# Patient Record
Sex: Female | Born: 1965 | Race: Black or African American | Hispanic: No | Marital: Married | Smoking: Former smoker
Health system: Southern US, Community
[De-identification: ages and names within clinical notes are randomized; demographics above are authoritative.]

## PROBLEM LIST (undated history)

## (undated) DIAGNOSIS — Z801 Family history of malignant neoplasm of trachea, bronchus and lung: Secondary | ICD-10-CM

## (undated) DIAGNOSIS — Z803 Family history of malignant neoplasm of breast: Secondary | ICD-10-CM

## (undated) DIAGNOSIS — I10 Essential (primary) hypertension: Secondary | ICD-10-CM

## (undated) DIAGNOSIS — M1990 Unspecified osteoarthritis, unspecified site: Secondary | ICD-10-CM

## (undated) DIAGNOSIS — Z87442 Personal history of urinary calculi: Secondary | ICD-10-CM

## (undated) DIAGNOSIS — Z923 Personal history of irradiation: Secondary | ICD-10-CM

## (undated) DIAGNOSIS — I639 Cerebral infarction, unspecified: Secondary | ICD-10-CM

## (undated) DIAGNOSIS — E7800 Pure hypercholesterolemia, unspecified: Secondary | ICD-10-CM

## (undated) HISTORY — DX: Family history of malignant neoplasm of trachea, bronchus and lung: Z80.1

## (undated) HISTORY — PX: ABDOMINAL HYSTERECTOMY: SHX81

## (undated) HISTORY — PX: BREAST BIOPSY: SHX20

## (undated) HISTORY — PX: CHOLECYSTECTOMY: SHX55

## (undated) HISTORY — DX: Family history of malignant neoplasm of breast: Z80.3

## (undated) HISTORY — DX: Pure hypercholesterolemia, unspecified: E78.00

---

## 2005-02-23 ENCOUNTER — Ambulatory Visit: Payer: Self-pay | Admitting: Unknown Physician Specialty

## 2005-02-23 IMAGING — US ABDOMEN ULTRASOUND
1 series · 17 of 25 positions shown · non-contrast
Comparison: none

REASON FOR EXAM: abdominal pain, nausea and vomitting
COMMENTS:

[Series 1: abdomen ultrasound · 17 of 55 slices shown]
[im 1/55]
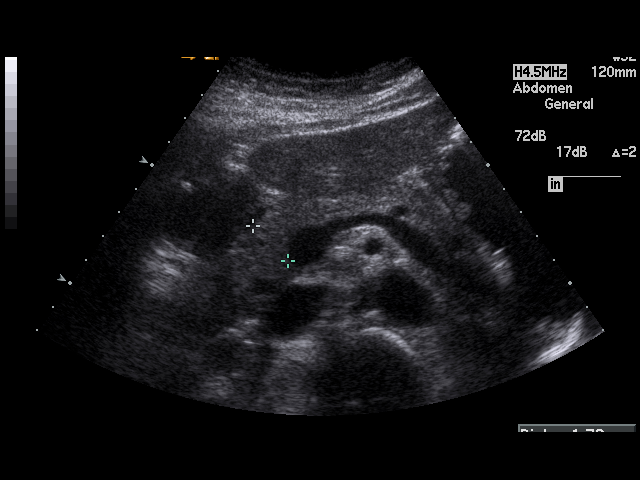
[im 5/55]
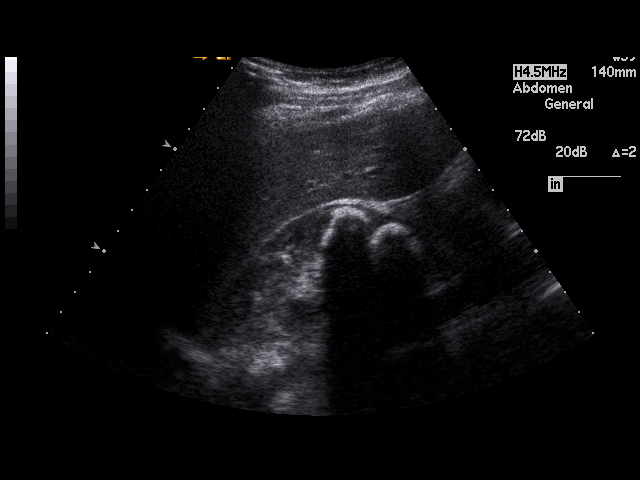
[im 7/55]
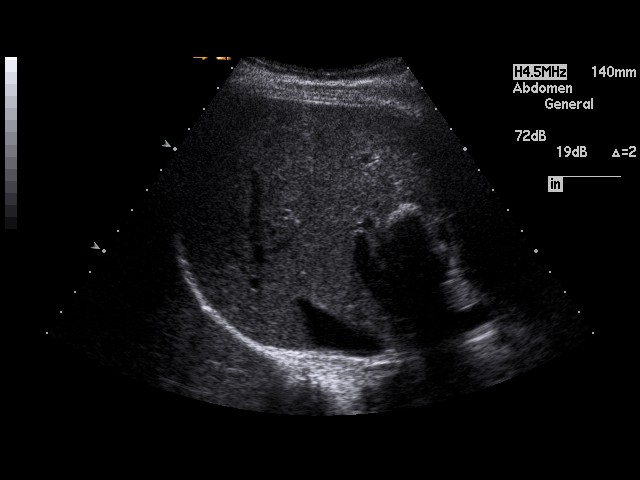
[im 12/55]
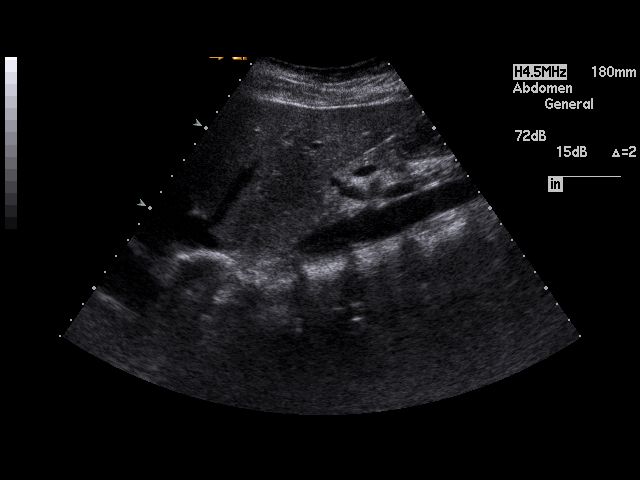
[im 14/55]
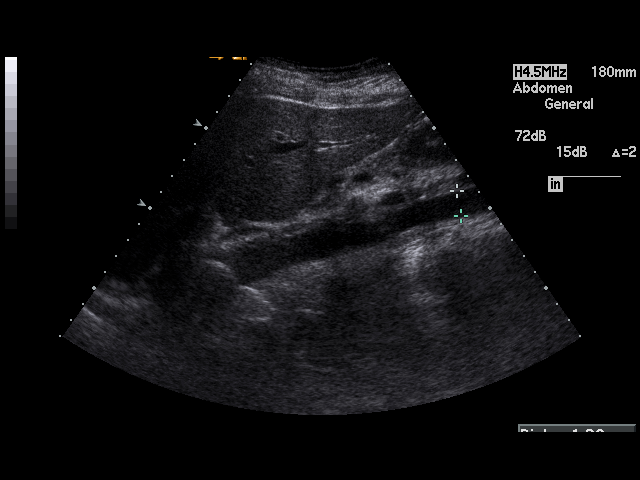
[im 19/55]
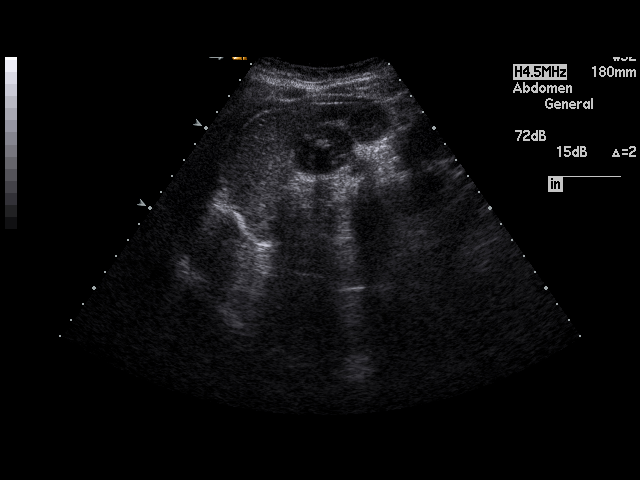
[im 21/55]
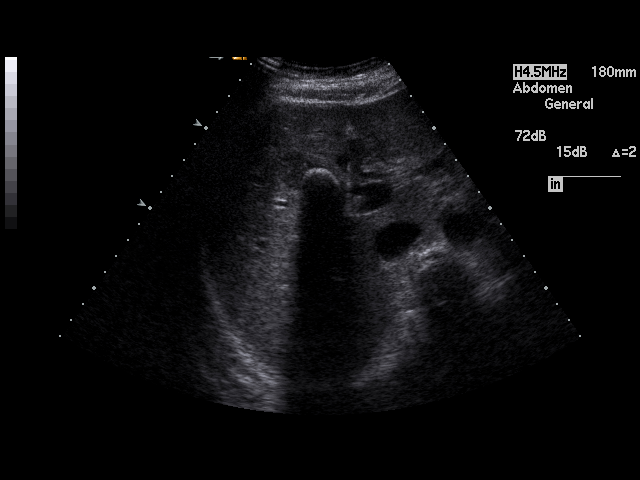
[im 25/55]
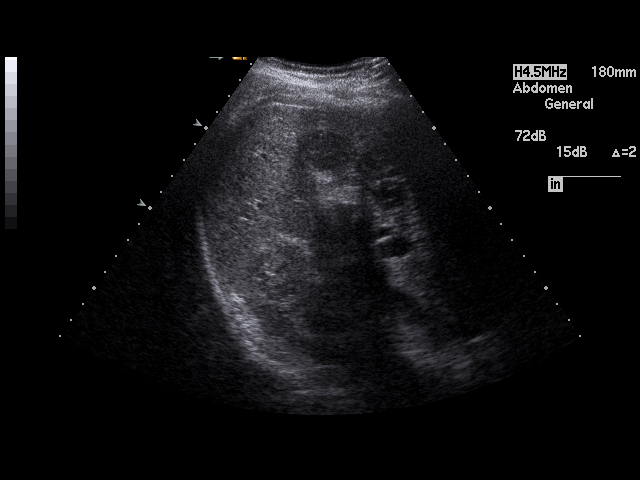
[im 28/55]
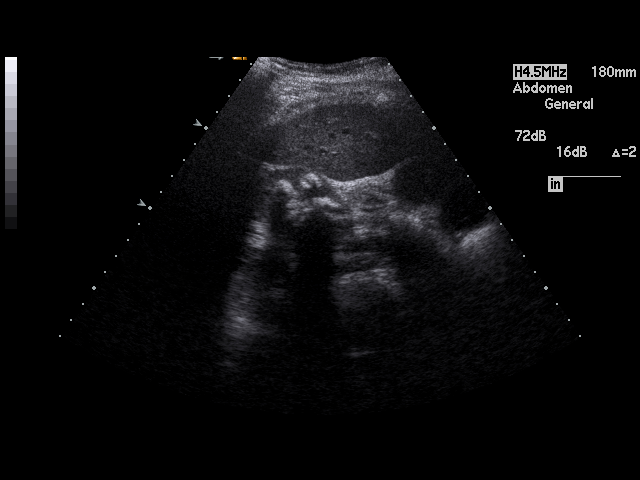
[im 30/55]
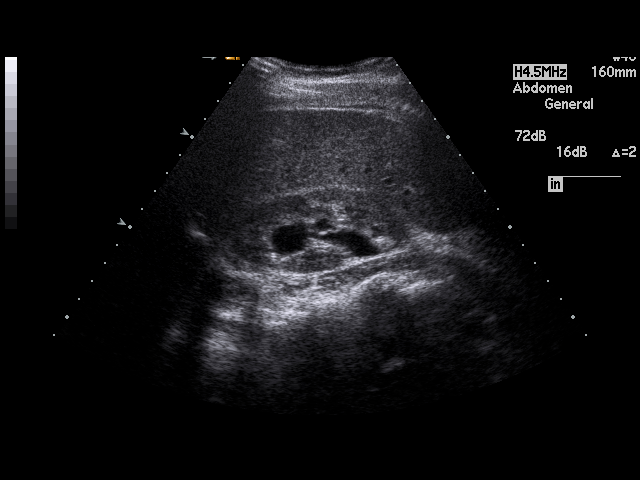
[im 34/55]
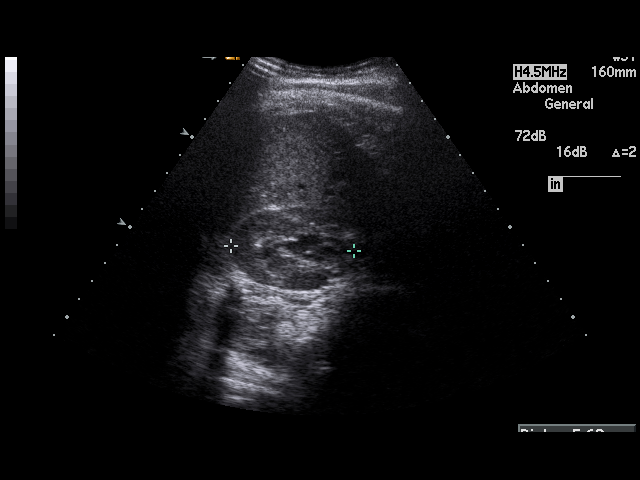
[im 37/55]
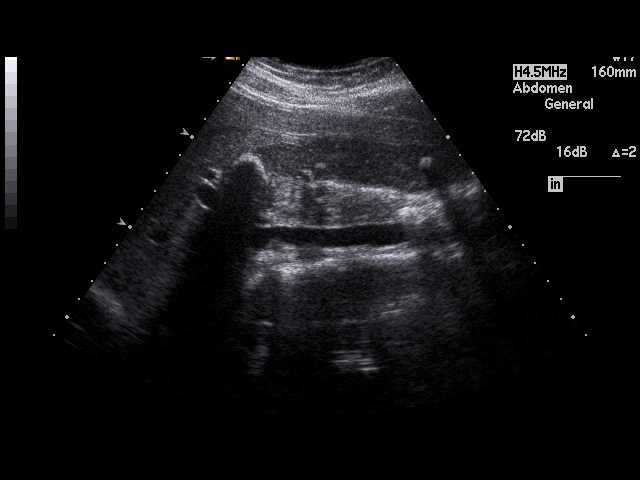
[im 41/55]
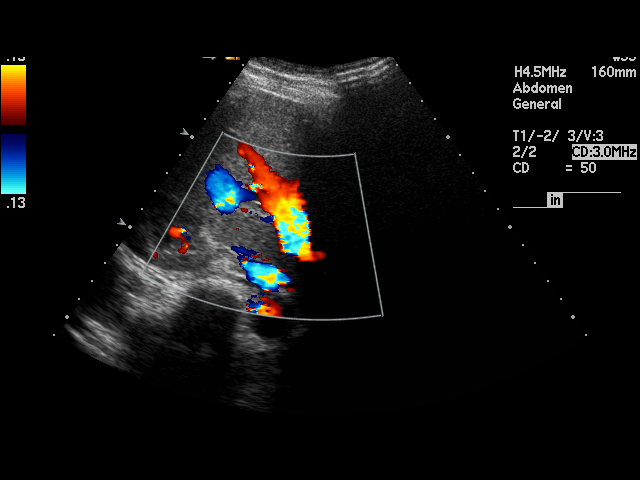
[im 43/55]
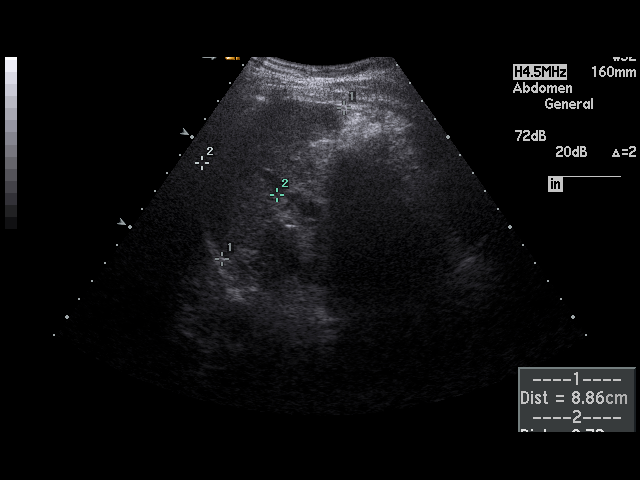
[im 48/55]
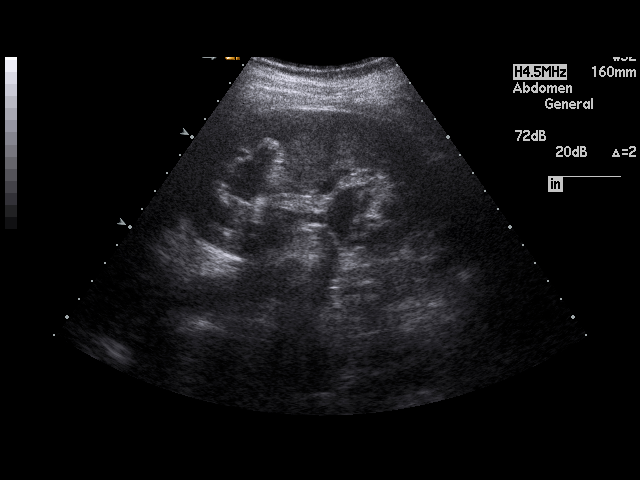
[im 50/55]
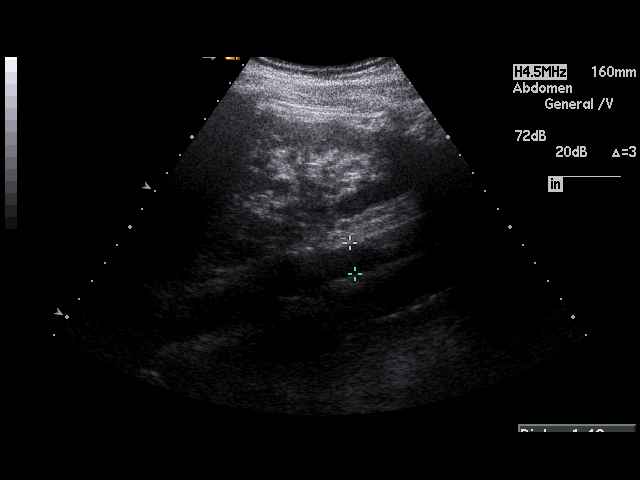
[im 55/55]
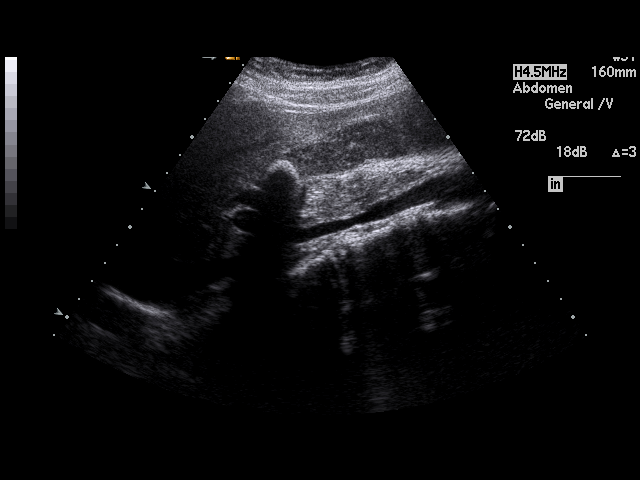

[17 of 25 positions shown; findings below may reference images not displayed]

PROCEDURE:     US  - US ABDOMEN GENERAL SURVEY  - [DATE] [DATE]

RESULT:     The liver, spleen and pancreas show no significant
abnormalities.  There are multiple echodensities in the gallbladder
compatible with gallstones.  Sludge is also present in the gallbladder. No
definite thickening of the gallbladder wall is seen.  There is noted a stone
visualized at the gallbladder neck, which does not move as the patient
changes position and may be lodged or intermittently lodged in the neck.
Note is made that a similar appearance is seen on a prior ultrasound exam of
[DATE].  The common bile duct measures 6.5 mm in diameter which is upper
limits to normal in size.

The kidneys show mild bilateral hydronephrosis, more prominent on the RIGHT.
 Multiple RIGHT renal caliceal stones are noted.  These were also present on
the prior exam of [DATE].  No ascites is seen.
IMPRESSION: 1)Cholelithiasis.

2)The common bile duct is upper limits of normal in size.

3)There is mild bilateral hydronephrosis, more prominent on the RIGHT.

4)Multiple RIGHT renal stones are seen.

## 2005-03-03 ENCOUNTER — Emergency Department: Payer: Self-pay | Admitting: Emergency Medicine

## 2005-05-25 ENCOUNTER — Other Ambulatory Visit: Payer: Self-pay

## 2005-05-29 ENCOUNTER — Ambulatory Visit: Payer: Self-pay | Admitting: General Surgery

## 2006-04-29 ENCOUNTER — Ambulatory Visit: Payer: Self-pay | Admitting: Family Medicine

## 2006-04-29 IMAGING — CR CERVICAL SPINE - COMPLETE 4+ VIEW
1 series · 7 of 7 positions shown · non-contrast
Comparison: none

REASON FOR EXAM: C-SPINE CODE 782.0  DISTURBANCE OF THE SKIN SENSATION
COMMENTS:

[Series 1: view not recorded · 0.17mm/px · 7 of 7 slices shown]
[im 1/7]
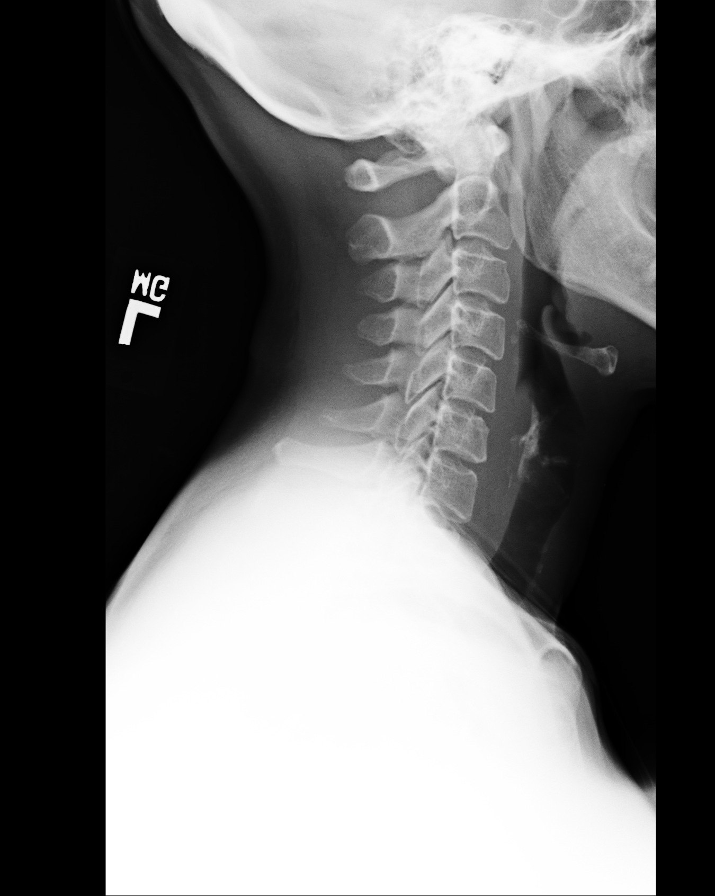
[im 2/7]
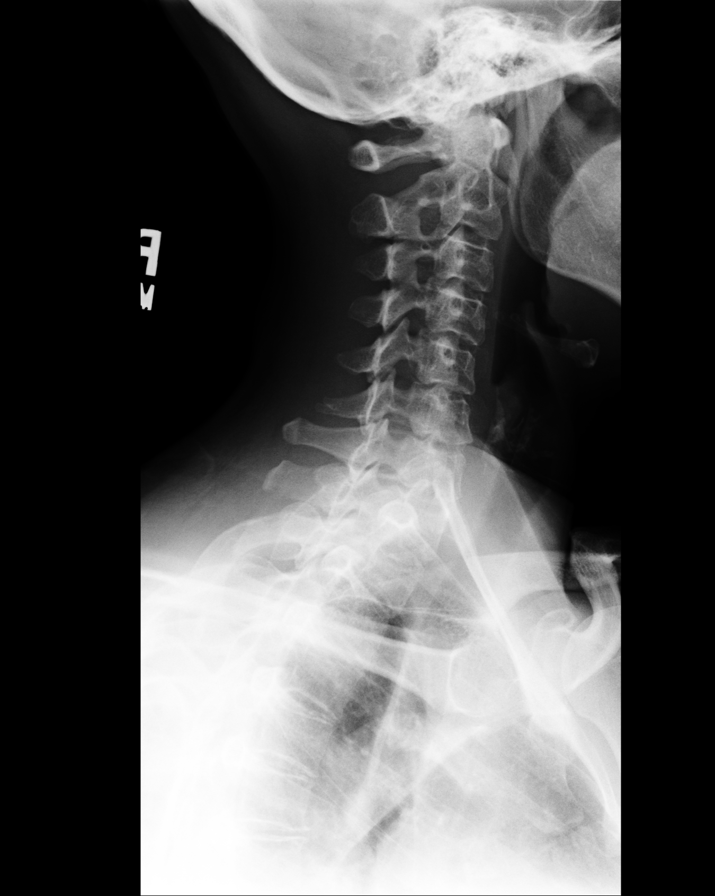
[im 3/7]
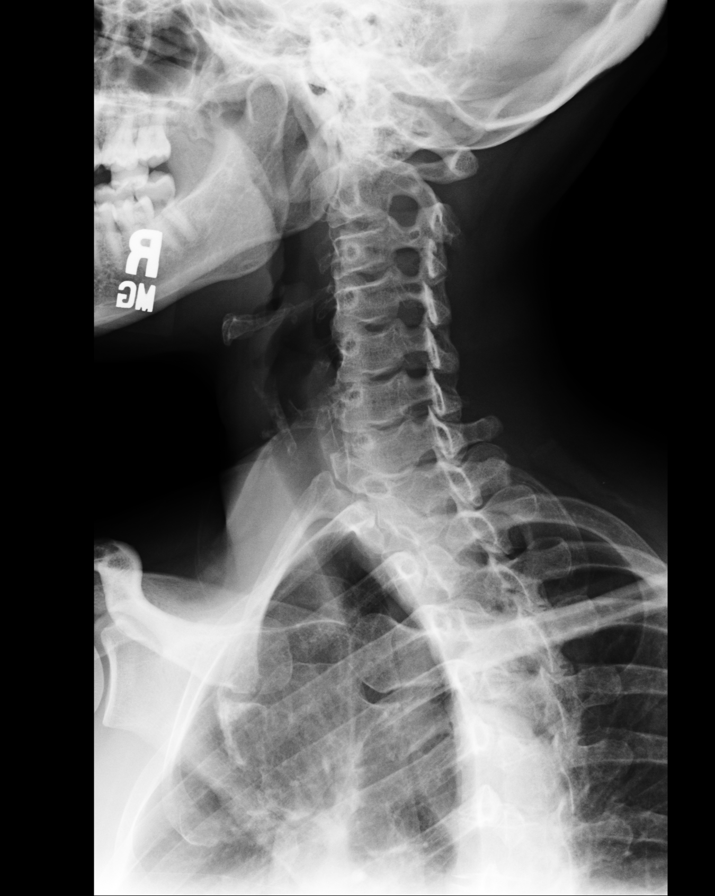
[im 4/7]
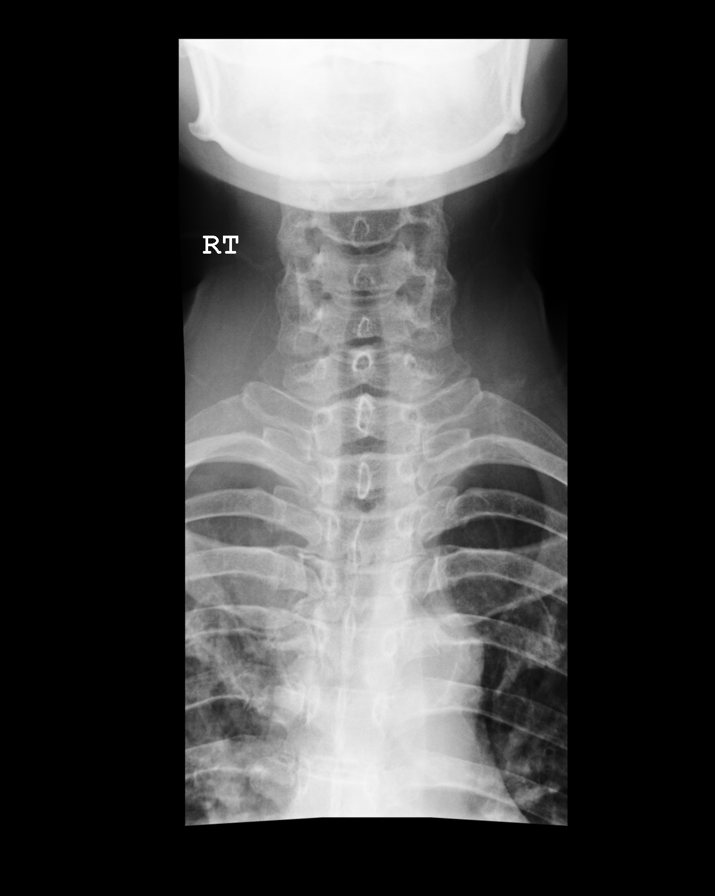
[im 5/7]
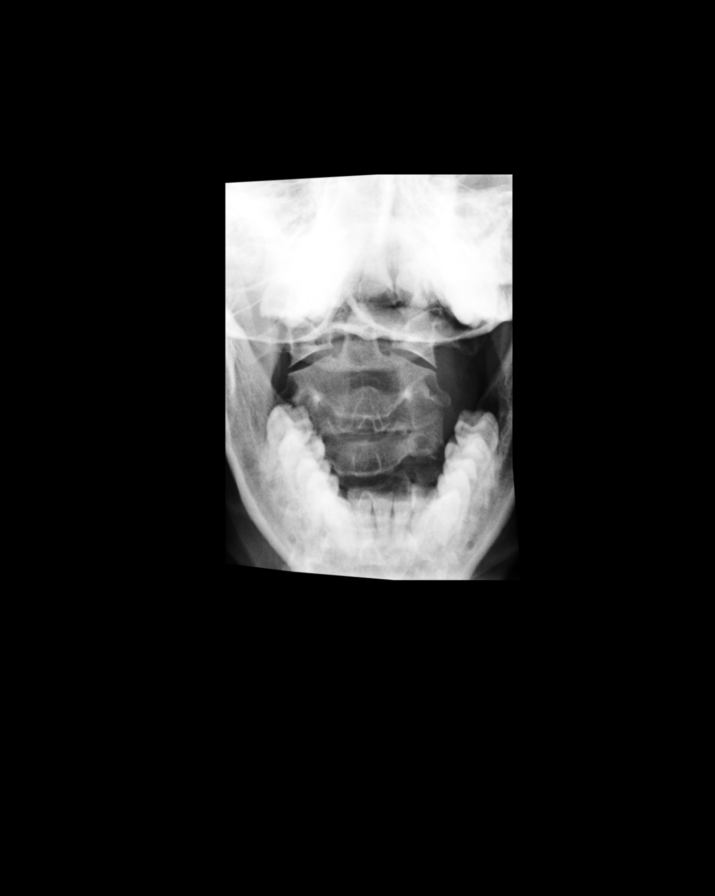
[im 6/7]
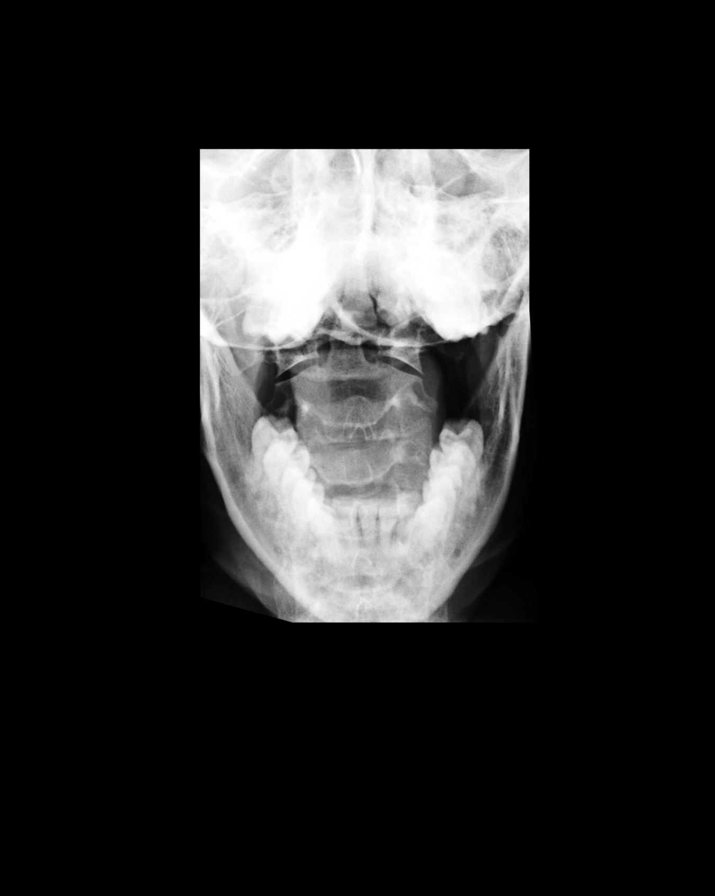
[im 7/7]
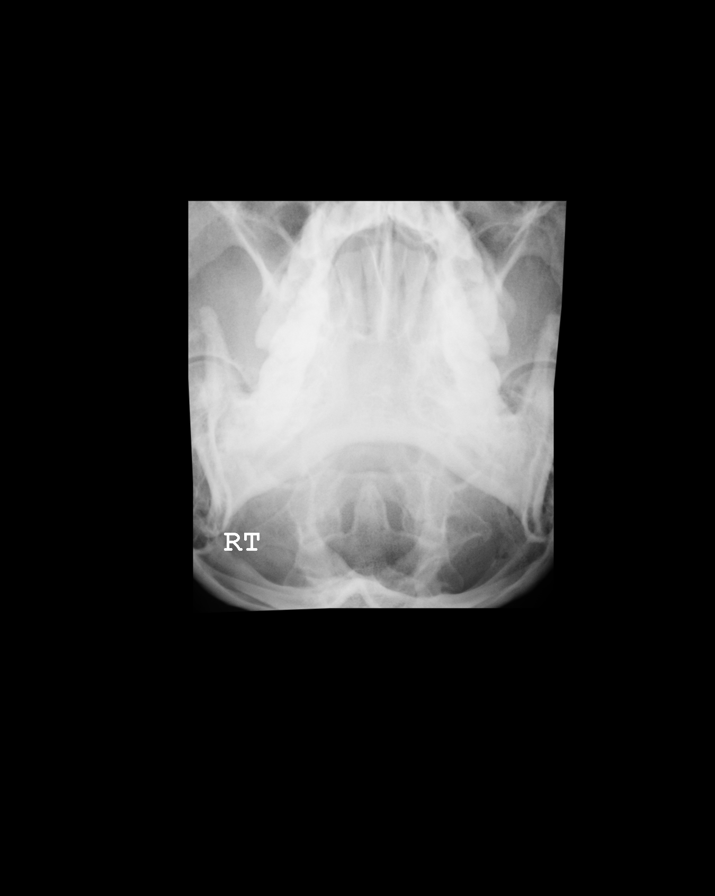

[7 of 7 positions shown; findings below may reference images not displayed]

PROCEDURE:     DXR - DXR CERVICAL SPINE COMPLETE  - [DATE] [DATE]

RESULT:       The patient is complaining of neck discomfort.

The cervical vertebral bodies are preserved in height.  The intervertebral
disc space heights are well maintained.  The oblique views reveal no
high-grade bony encroachment upon the neural foramina.  The  odontoid is
intact.   The lateral masses of C1 align normally with those of C2.
IMPRESSION: I do not see acute cervical spine abnormality.    Further
evaluation with MRI is available if the patient's symptoms persist or are
radicular in nature.

## 2008-02-19 ENCOUNTER — Ambulatory Visit: Payer: Self-pay | Admitting: Family Medicine

## 2010-02-04 ENCOUNTER — Inpatient Hospital Stay: Payer: Self-pay | Admitting: Internal Medicine

## 2010-02-04 IMAGING — CT CT HEAD WITHOUT CONTRAST
2 series · 16 of 30 positions shown, 20 images · non-contrast
Comparison: none

REASON FOR EXAM: HA x 2 wks, at times w/ nausea & photphobia
COMMENTS:   May transport without cardiac monitor

PROCEDURE:     CT  - CT HEAD WITHOUT CONTRAST  - [DATE] [DATE]
RESULT:     Comparison:  None
TECHNIQUE: Multiple axial images from the foramen magnum to the vertex were
obtained without IV contrast.

[Series 2: without · axial · non-contrast · 0.42mm/px · z∈[+360,+490]mm · 13 of 32 slices shown, 17 images]
[im 3/32  brain]
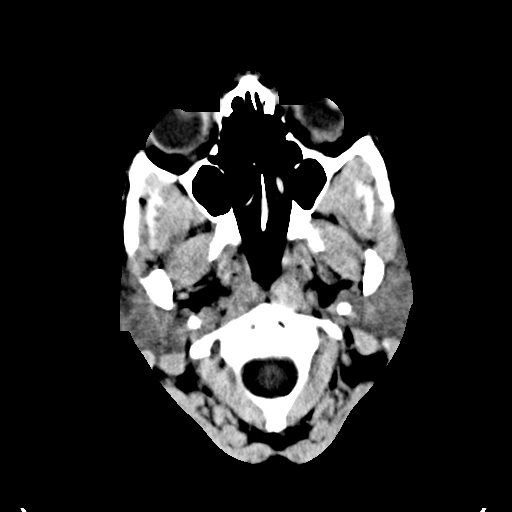
[im 3/32  bone]
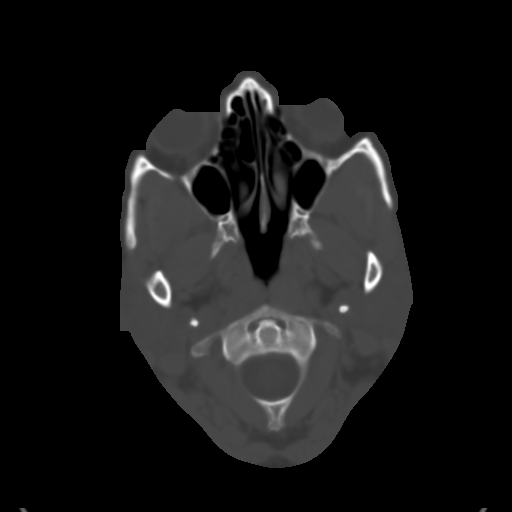
[im 5/32  brain]
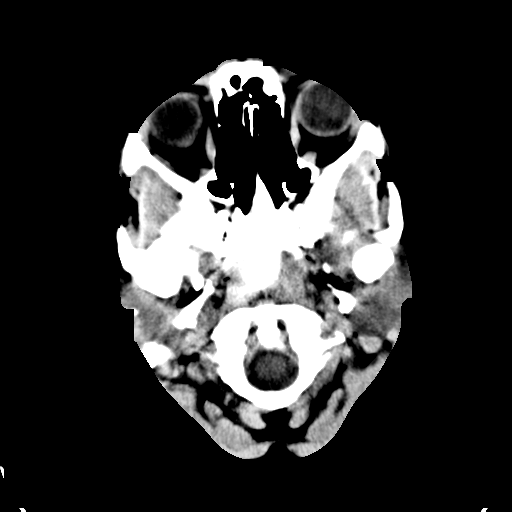
[im 7/32  brain]
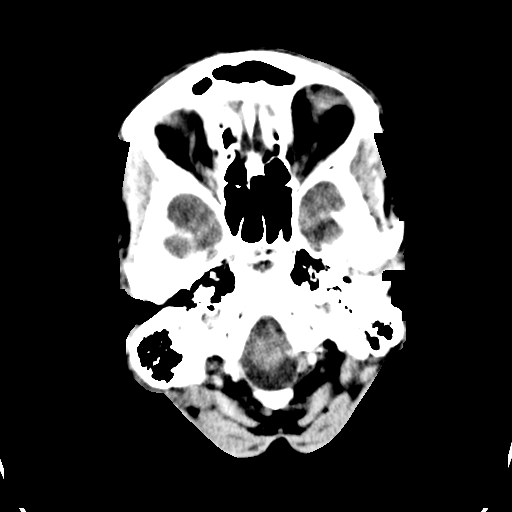
[im 9/32  brain]
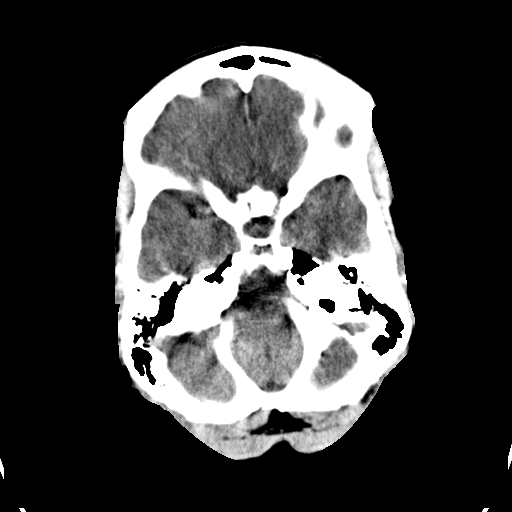
[im 12/32  brain]
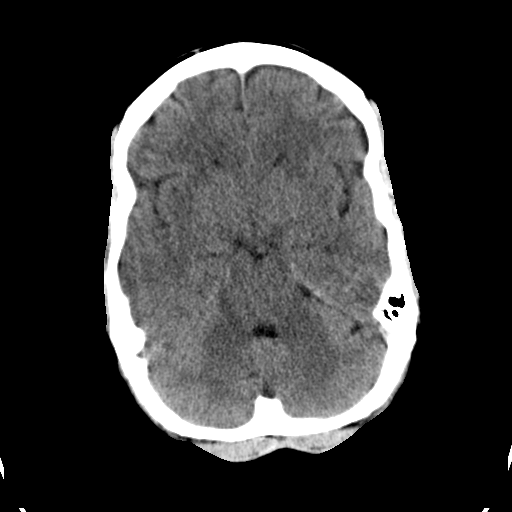
[im 12/32  bone]
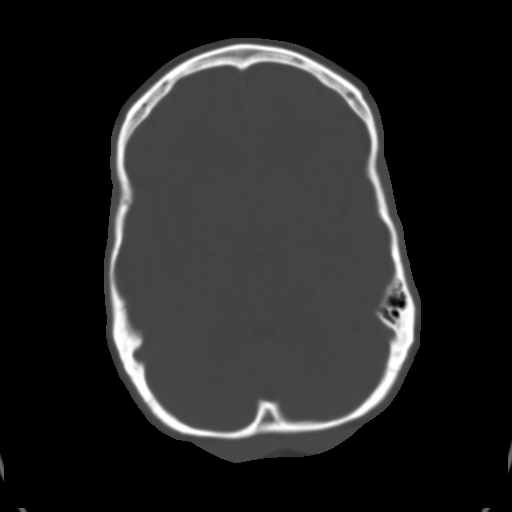
[im 14/32  brain]
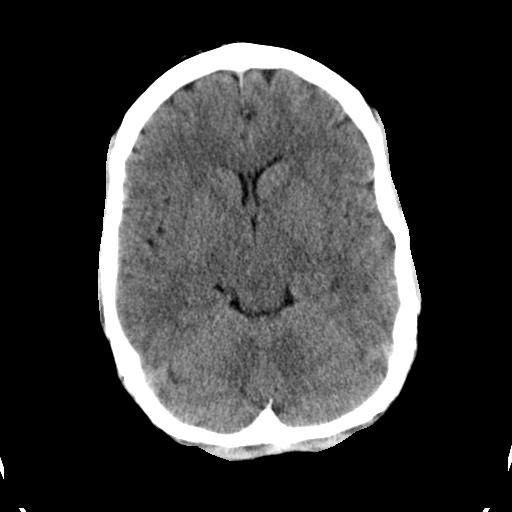
[im 16/32  brain]
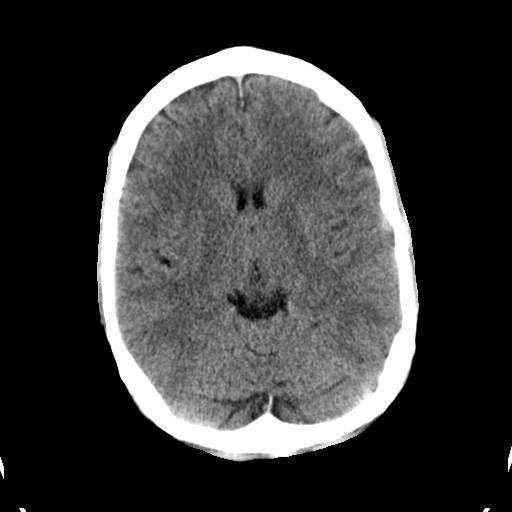
[im 18/32  brain]
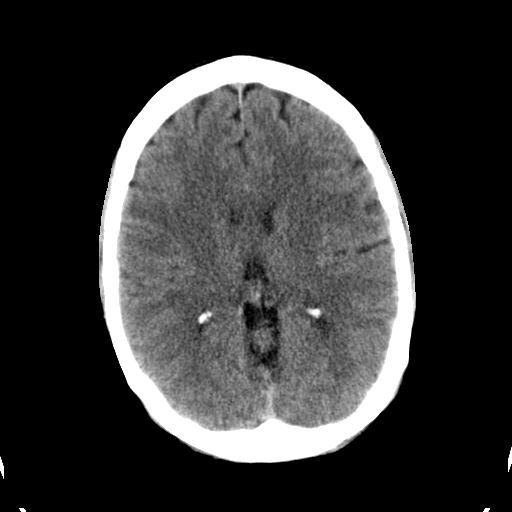
[im 20/32  brain]
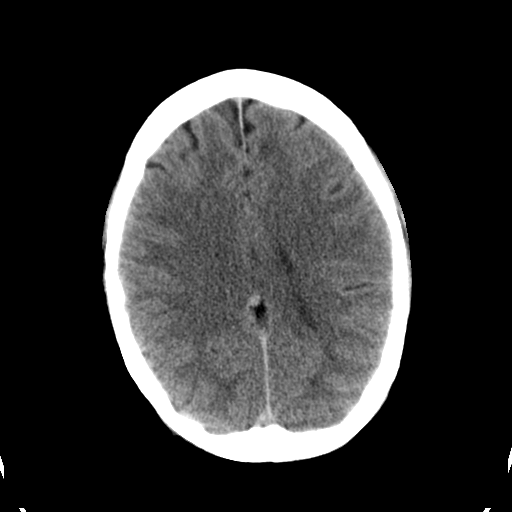
[im 20/32  bone]
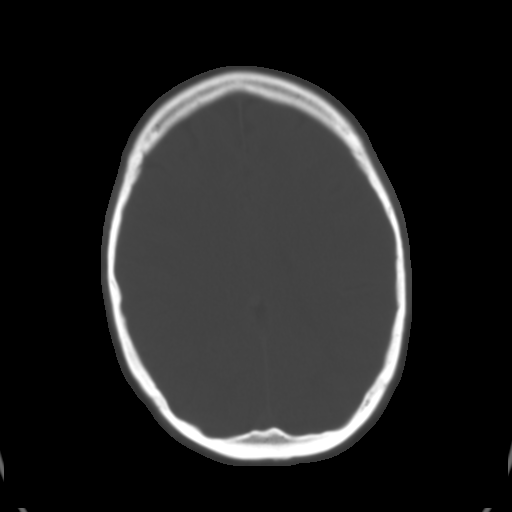
[im 23/32  brain]
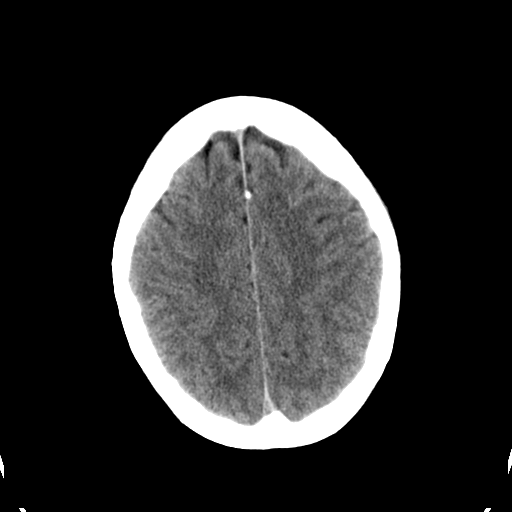
[im 25/32  brain]
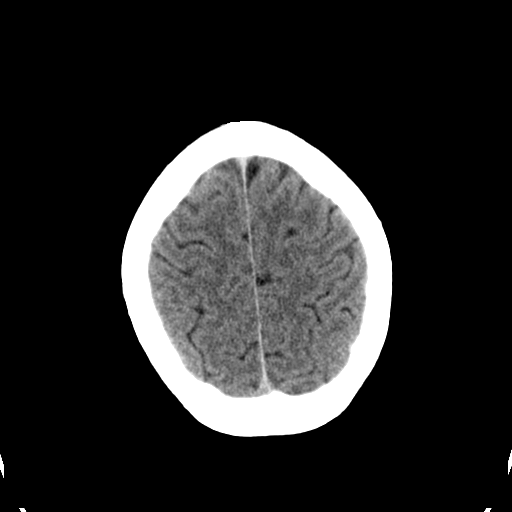
[im 27/32  brain]
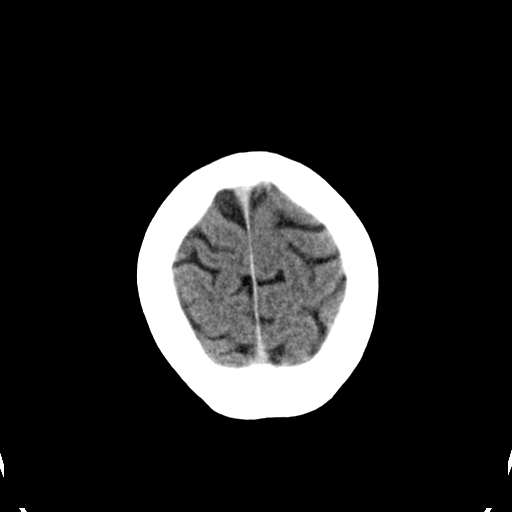
[im 29/32  brain]
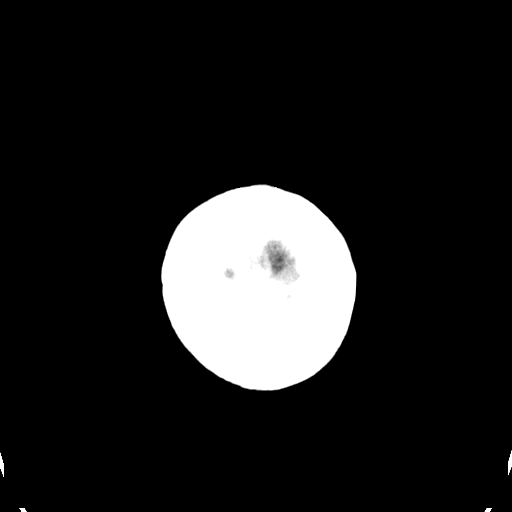
[im 29/32  bone]
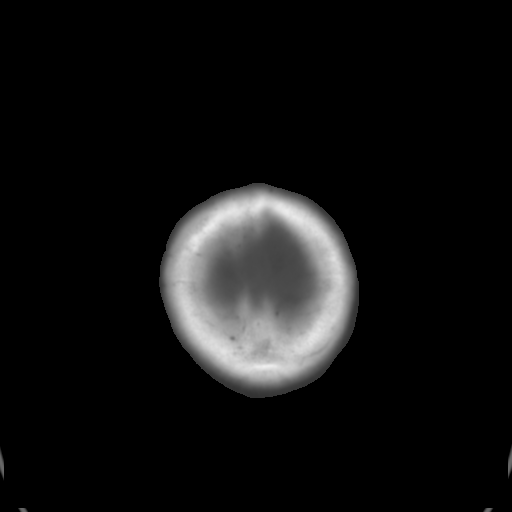

[Series 3: bone · axial · 0.42mm/px · z∈[+360,+405]mm · 3 of 32 slices shown]
[im 3/32  bone]
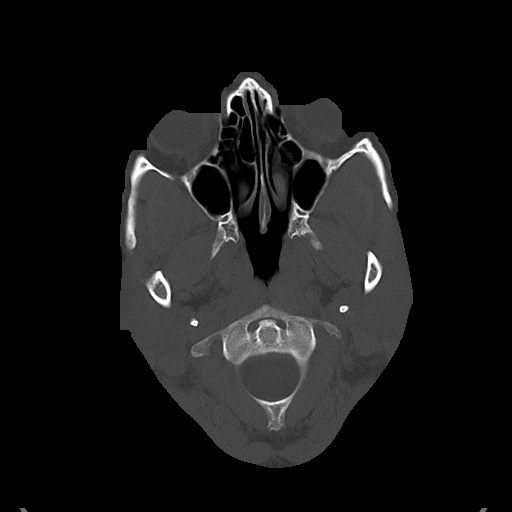
[im 7/32  bone]
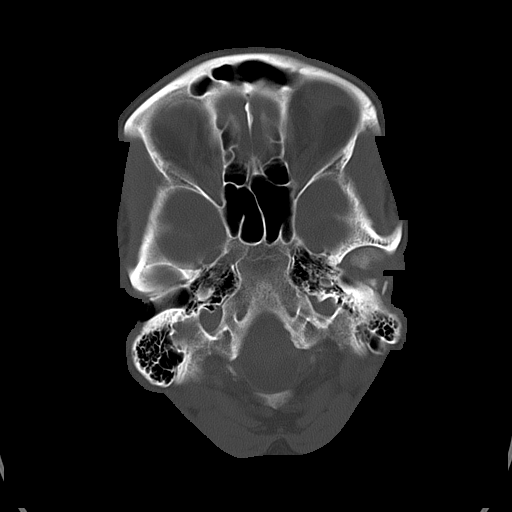
[im 12/32  bone]
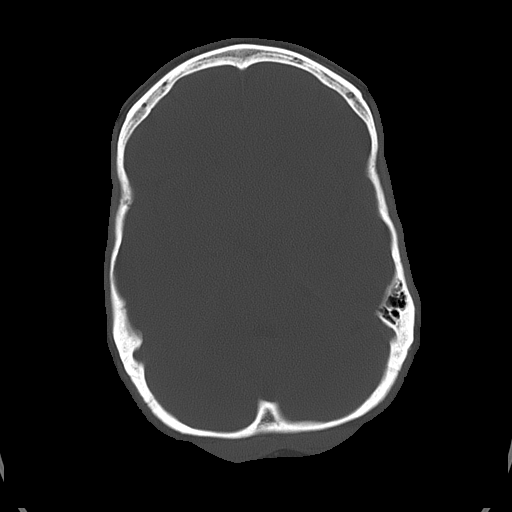

[16 of 30 positions shown; findings below may reference images not displayed]

FINDINGS: There is no evidence of mass effect, midline shift, or extra-axial fluid
collections.  There is no evidence of a space-occupying lesion or
intracranial hemorrhage. There is no evidence of a cortical-based area of
acute infarction.

The ventricles and sulci are appropriate for the patient's age. The basal
cisterns are patent.

Visualized portions of the orbits are unremarkable. The visualized portions
of the paranasal sinuses and mastoid air cells are unremarkable.

The osseous structures are unremarkable.
IMPRESSION: No acute intracranial process.

## 2011-04-16 ENCOUNTER — Ambulatory Visit: Payer: Self-pay | Admitting: Family Medicine

## 2011-04-16 IMAGING — MG MAM DGTL DIAGNOSTIC MAMMO W/CAD
1 series · 8 of 8 positions shown · non-contrast
Comparison: none

REASON FOR EXAM: L mass 2 oclock  yrly
COMMENTS:

PROCEDURE:     MAM - MAM DGTL DIAGNOSTIC MAMMO W/CAD  - [DATE]  [DATE]
RESULT:
COMPARISONS: [DATE] and [DATE].

[Series 1234: R CC · right · 8 of 10 slices shown]
[im 1/10]
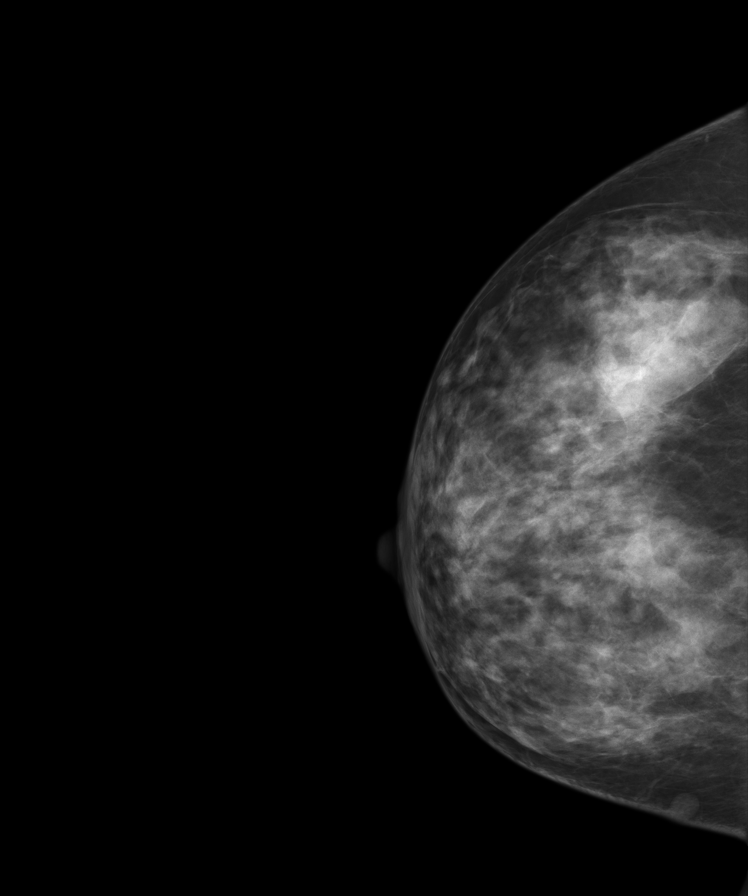
[im 2/10]
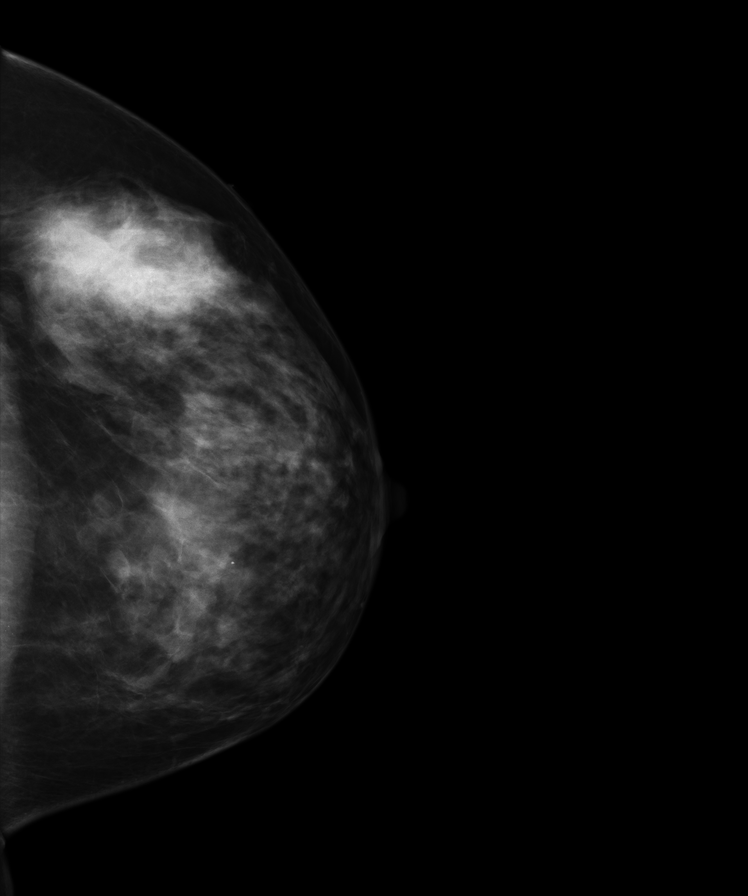
[im 3/10]
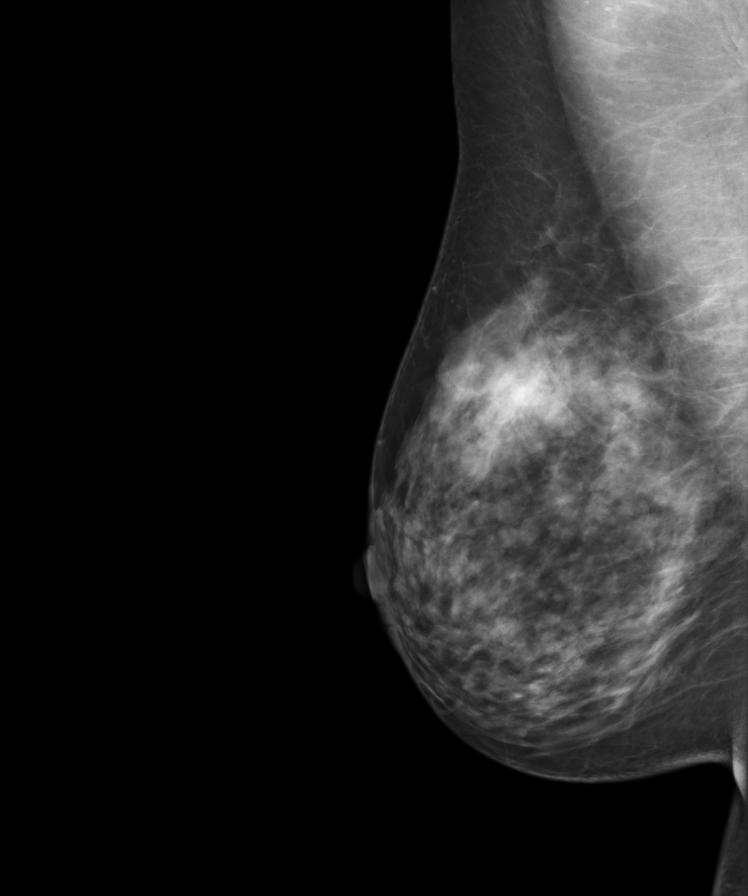
[im 4/10]
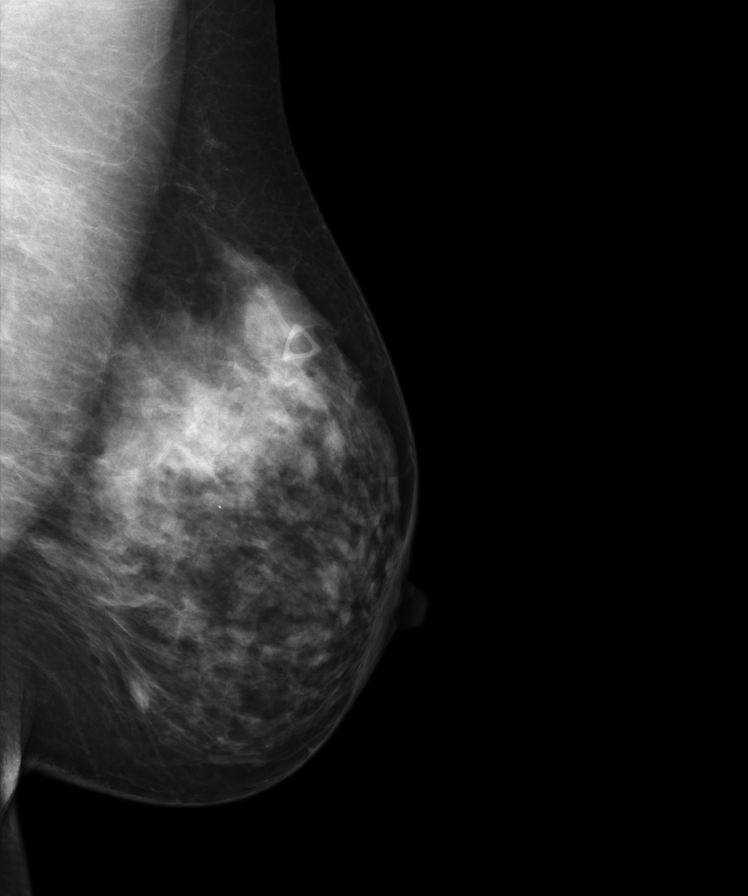
[im 6/10]
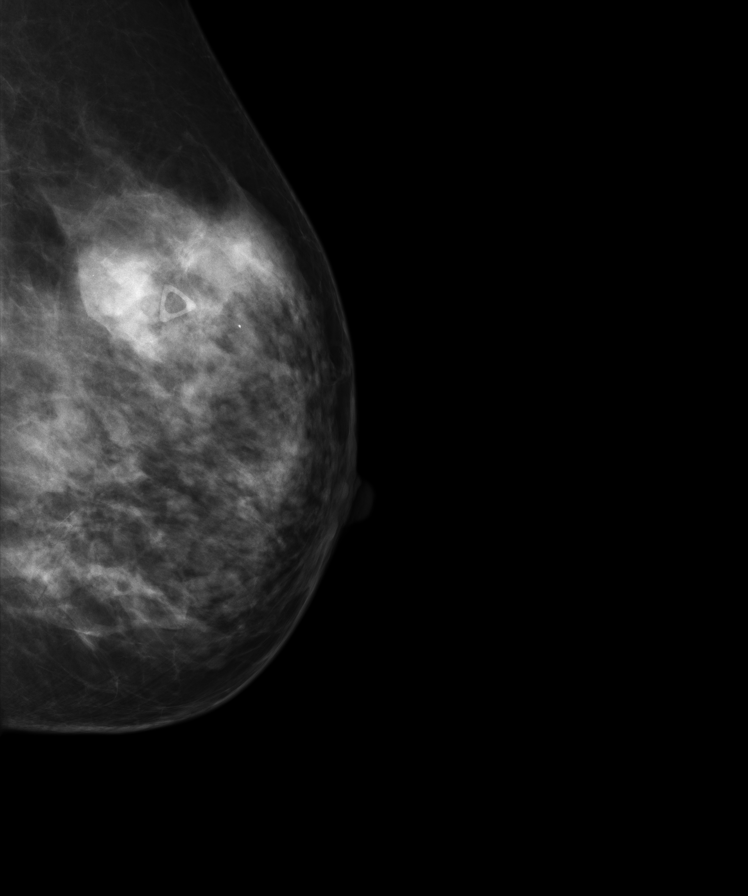
[im 7/10]
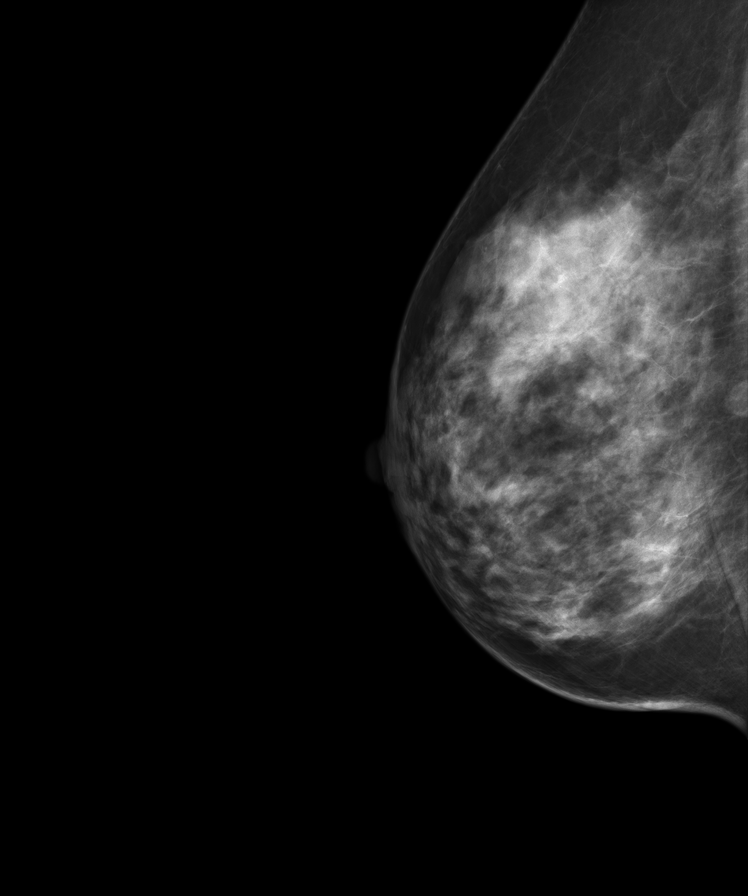
[im 8/10]
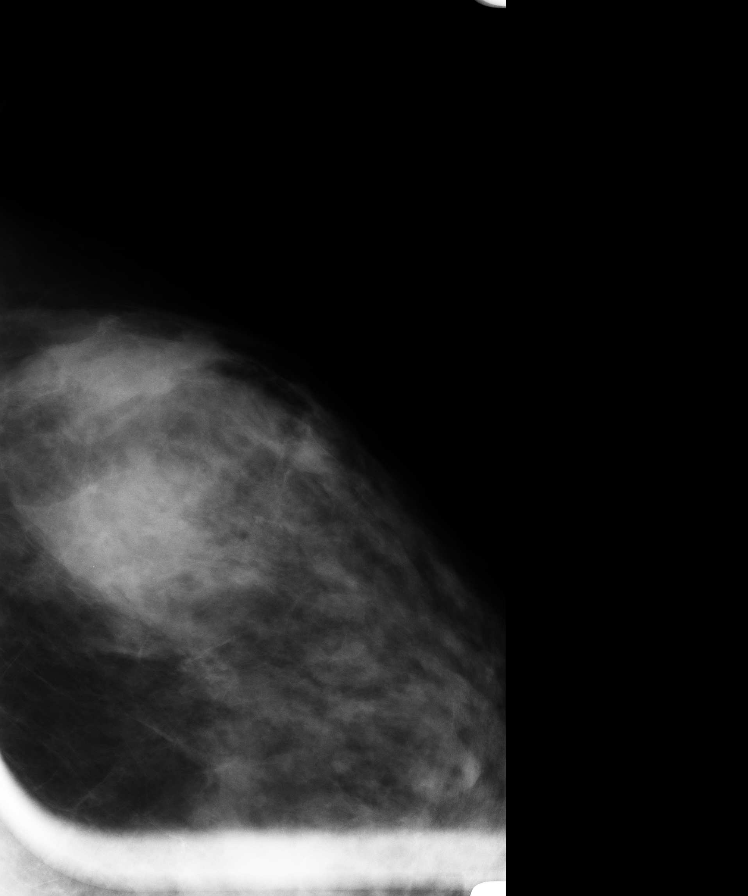
[im 10/10]
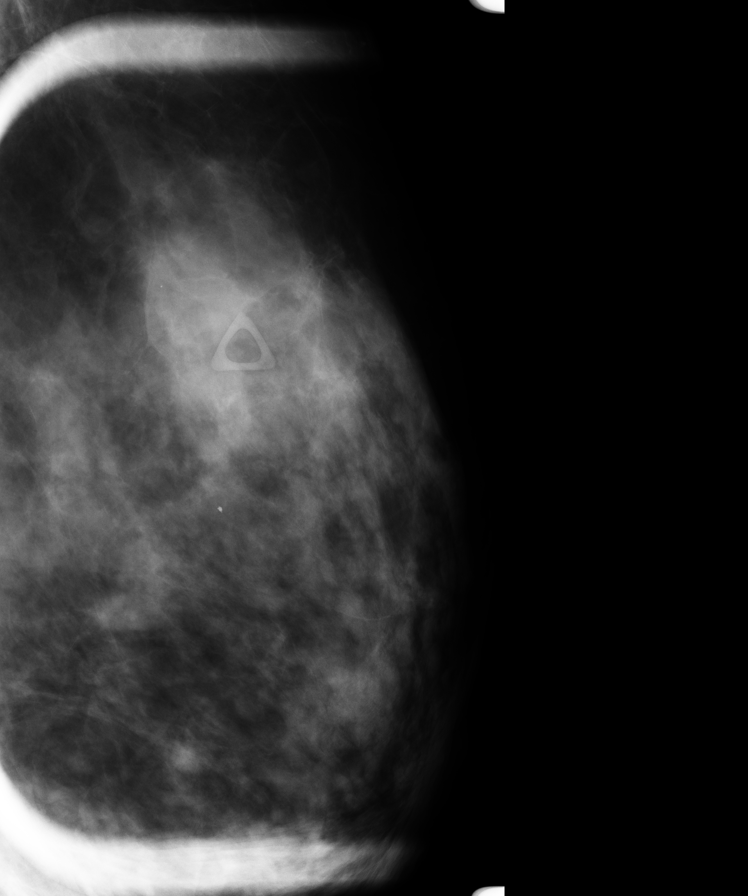

[8 of 8 positions shown; findings below may reference images not displayed]

FINDINGS: Bilateral breasts demonstrate a heterogeneous breast density which
may lower the sensitivity of mammography. There is a 7 mm subdermal mass in
the lateral right breast on the CC view not well delineated on the MLO view.
This may represent a breast mass versus a sebaceous cyst.

There is no other dominant mass, architectural distortion or clusters of
suspicious microcalcifications.

Further evaluation was performed with real time sonography of the left
breast at the 2 o'clock position with a high frequency linear transducer.

At the 2 o'clock position there is a 2.3 x 1.1 x 1.7 cm irregularly
marginated hypoechoic mass with a central cystic area measuring 1.2 x 0.5 x
0.9 cm.  There is no significant internal Doppler flow. There is posterior
acoustic shadowing of the hypoechoic areas with increased through
transmission posterior to the cystic area.
IMPRESSION: 1.Complex cystic mass at the 2 o'clock position in the left breast.
Recommend tissue diagnosis.

BI-RADS: Category 4 - Suspicious Abnormality

2.Small 7 mm subdermal nodule in the lateral right breast on the CC view not
delineated on the MLO view. Recommend ultrasound evaluation of the lateral
right breast.

BI-RADS: Category 0-Needs Additional Imaging Evaluation

A NEGATIVE MAMMOGRAM REPORT DOES NOT PRECLUDE BIOPSY OR OTHER EVALUATION OF
A CLINICALLY PALPABLE OR OTHERWISE SUSPICIOUS MASS OR LESION. BREAST CANCER
MAY NOT BE DETECTED BY MAMMOGRAPHY IN UP TO 10% OF CASES.

## 2011-04-23 ENCOUNTER — Ambulatory Visit: Payer: Self-pay | Admitting: Family Medicine

## 2011-04-23 IMAGING — US ULTRASOUND RIGHT BREAST
1 series · 9 of 9 positions shown · non-contrast
Comparison: none

REASON FOR EXAM: SUBDERMAL NODUAL
COMMENTS:

PROCEDURE:     US  - US BREAST RIGHT  - [DATE] [DATE]
RESULT:

[Series 1: ultrasound right breast · 9 of 9 slices shown]
[im 1/9]
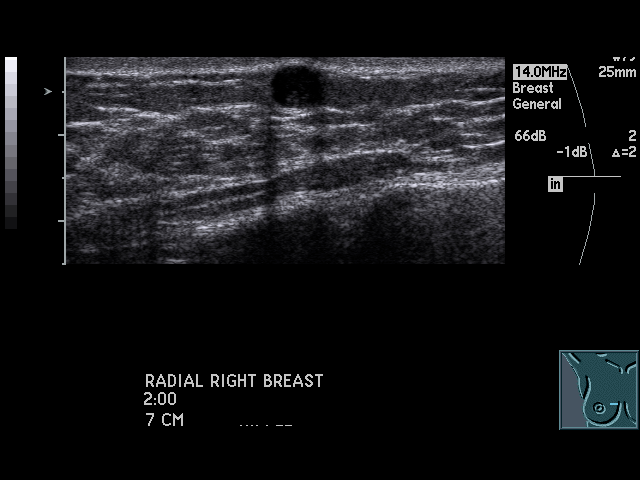
[im 2/9]
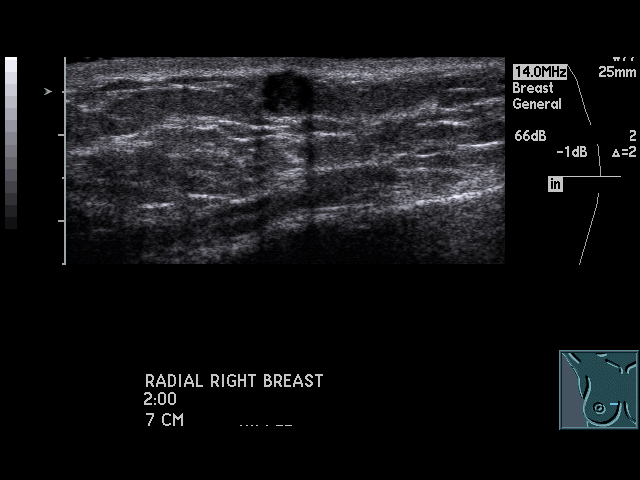
[im 3/9]
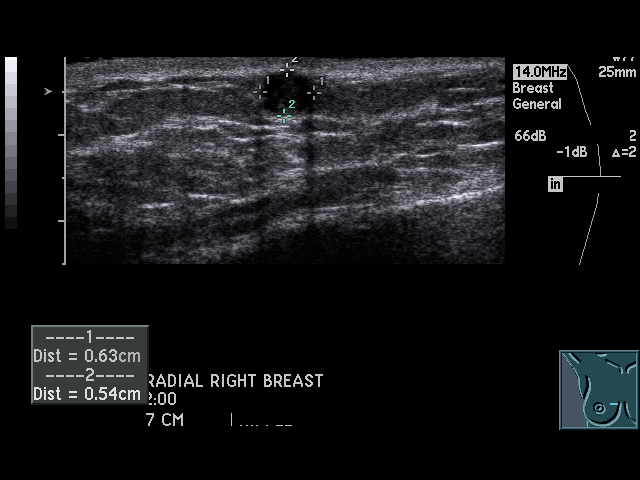
[im 4/9]
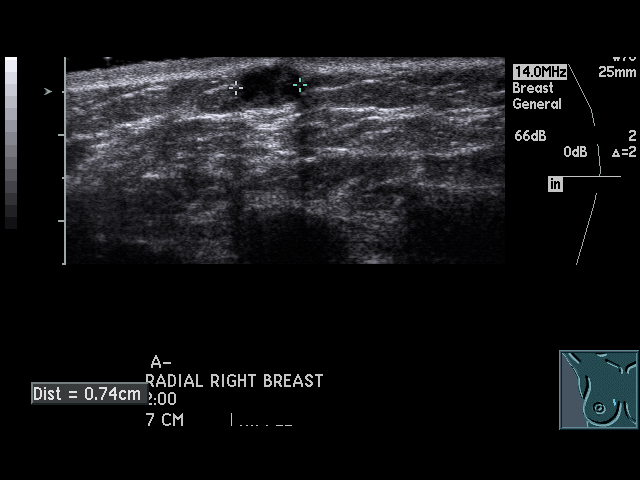
[im 5/9]
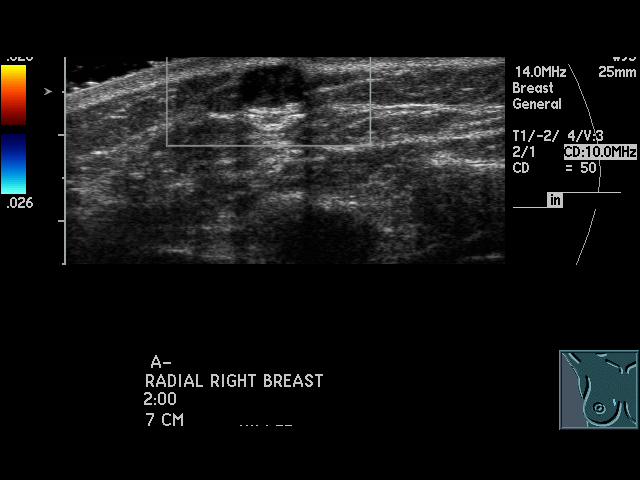
[im 6/9]
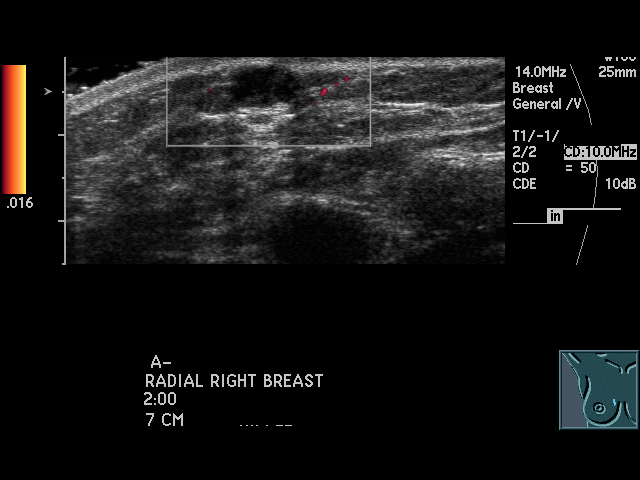
[im 7/9]
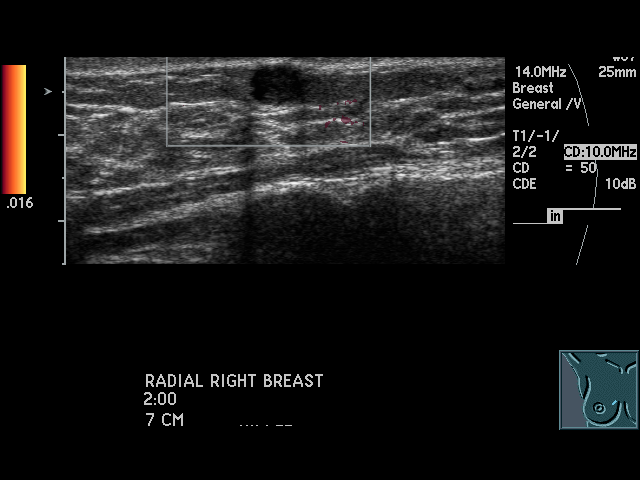
[im 8/9]
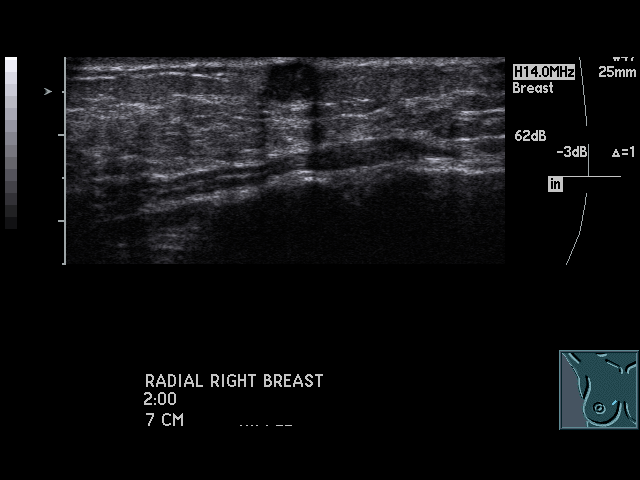
[im 9/9]
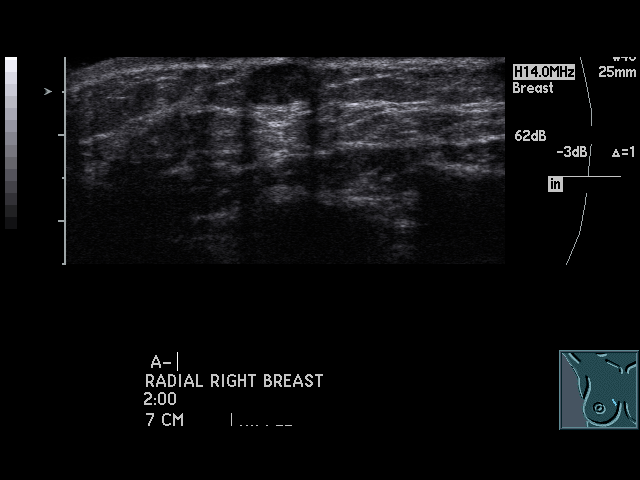

[9 of 9 positions shown; findings below may reference images not displayed]

FINDINGS: Real time sonography of the right breast was performed with a high
frequency linear transducer. The medial aspect of the breast was evaluated.

At the 2 o'clock position there is a mildly complex hypoechoic mass with
increased through transmission. There is no definite internal Doppler flow.
There is no other solid or cystic mass. The mass is located just below the
dermis.
IMPRESSION: 7 x 6 x 5 mm hypoechoic complex mass just below the dermis at the 2 o'clock
position in the right breast. Recommend fine needle aspiration.

BI-RADS: Category 4 - Suspicious Abnormality

## 2012-05-25 ENCOUNTER — Encounter (HOSPITAL_COMMUNITY): Payer: Self-pay | Admitting: *Deleted

## 2012-05-25 ENCOUNTER — Emergency Department (HOSPITAL_COMMUNITY)
Admission: EM | Admit: 2012-05-25 | Discharge: 2012-05-25 | Disposition: A | Discharge status: Home / Self Care | Payer: No Typology Code available for payment source | Attending: Emergency Medicine | Admitting: Emergency Medicine

## 2012-05-25 ENCOUNTER — Emergency Department (HOSPITAL_COMMUNITY): Payer: No Typology Code available for payment source

## 2012-05-25 DIAGNOSIS — R209 Unspecified disturbances of skin sensation: Secondary | ICD-10-CM | POA: Insufficient documentation

## 2012-05-25 DIAGNOSIS — F17200 Nicotine dependence, unspecified, uncomplicated: Secondary | ICD-10-CM | POA: Insufficient documentation

## 2012-05-25 DIAGNOSIS — M79609 Pain in unspecified limb: Secondary | ICD-10-CM | POA: Insufficient documentation

## 2012-05-25 DIAGNOSIS — M79643 Pain in unspecified hand: Secondary | ICD-10-CM

## 2012-05-25 DIAGNOSIS — I10 Essential (primary) hypertension: Secondary | ICD-10-CM | POA: Insufficient documentation

## 2012-05-25 HISTORY — DX: Essential (primary) hypertension: I10
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
## 2019-03-10 ENCOUNTER — Emergency Department
Admission: EM | Admit: 2019-03-10 | Discharge: 2019-03-10 | Disposition: A | Discharge status: Home / Self Care | Payer: Self-pay | Attending: Emergency Medicine | Admitting: Emergency Medicine

## 2019-03-10 ENCOUNTER — Other Ambulatory Visit: Payer: Self-pay

## 2019-03-10 ENCOUNTER — Encounter: Payer: Self-pay | Admitting: Emergency Medicine

## 2019-03-10 DIAGNOSIS — Z79899 Other long term (current) drug therapy: Secondary | ICD-10-CM | POA: Insufficient documentation

## 2019-03-10 DIAGNOSIS — E876 Hypokalemia: Secondary | ICD-10-CM | POA: Insufficient documentation

## 2019-03-10 DIAGNOSIS — N3000 Acute cystitis without hematuria: Secondary | ICD-10-CM | POA: Insufficient documentation

## 2019-03-10 DIAGNOSIS — E860 Dehydration: Secondary | ICD-10-CM | POA: Insufficient documentation

## 2019-03-10 DIAGNOSIS — F17210 Nicotine dependence, cigarettes, uncomplicated: Secondary | ICD-10-CM | POA: Insufficient documentation

## 2019-03-10 DIAGNOSIS — R112 Nausea with vomiting, unspecified: Secondary | ICD-10-CM | POA: Insufficient documentation

## 2019-03-10 LAB — URINALYSIS, COMPLETE (UACMP) WITH MICROSCOPIC
Bilirubin Urine: NEGATIVE
Glucose, UA: NEGATIVE mg/dL
Ketones, ur: 5 mg/dL — AB
Nitrite: POSITIVE — AB
Protein, ur: NEGATIVE mg/dL
Specific Gravity, Urine: 1.011 (ref 1.005–1.030)
WBC, UA: >50 WBC/hpf — ABNORMAL HIGH (ref 0–5)
pH: 6 (ref 5.0–8.0)

## 2019-03-10 LAB — BASIC METABOLIC PANEL
Anion gap: 14 (ref 5–15)
BUN: 30 mg/dL — ABNORMAL HIGH (ref 6–20)
CO2: 29 mmol/L (ref 22–32)
Calcium: 9.2 mg/dL (ref 8.9–10.3)
Chloride: 90 mmol/L — ABNORMAL LOW (ref 98–111)
Creatinine, Ser: 1.48 mg/dL — ABNORMAL HIGH (ref 0.44–1.00)
GFR calc Af Amer: 46 mL/min — ABNORMAL LOW (ref >60)
GFR calc non Af Amer: 40 mL/min — ABNORMAL LOW (ref >60)
Glucose, Bld: 106 mg/dL — ABNORMAL HIGH (ref 70–99)
Potassium: 2.7 mmol/L — CL (ref 3.5–5.1)
Sodium: 133 mmol/L — ABNORMAL LOW (ref 135–145)

## 2019-03-10 LAB — CBC
HCT: 48.2 % — ABNORMAL HIGH (ref 36.0–46.0)
Hemoglobin: 16.4 g/dL — ABNORMAL HIGH (ref 12.0–15.0)
MCH: 28.2 pg (ref 26.0–34.0)
MCHC: 34 g/dL (ref 30.0–36.0)
MCV: 82.8 fL (ref 80.0–100.0)
Platelets: 264 10*3/uL (ref 150–400)
RBC: 5.82 MIL/uL — ABNORMAL HIGH (ref 3.87–5.11)
RDW: 13 % (ref 11.5–15.5)
WBC: 10.4 10*3/uL (ref 4.0–10.5)
nRBC: 0 % (ref 0.0–0.2)

## 2019-03-10 MED ORDER — POTASSIUM CHLORIDE 10 MEQ/100ML IV SOLN
10.0000 meq | INTRAVENOUS | Status: AC
  Administered 2019-03-10 (×2): 10 meq via INTRAVENOUS
  Filled 2019-03-10 (×3): qty 100

## 2019-03-10 MED ORDER — SODIUM CHLORIDE 0.9 % IV BOLUS
1000.0000 mL | Freq: Once | INTRAVENOUS | Status: AC
Start: 2019-03-10 — End: 2019-03-10
  Administered 2019-03-10: 1000 mL via INTRAVENOUS

## 2019-03-10 MED ORDER — POTASSIUM CHLORIDE CRYS ER 20 MEQ PO TBCR
40.0000 meq | EXTENDED_RELEASE_TABLET | Freq: Once | ORAL | Status: AC
  Administered 2019-03-10: 40 meq via ORAL
  Filled 2019-03-10: qty 2

## 2019-03-10 MED ORDER — ONDANSETRON 4 MG PO TBDP: 4.0000 mg | ORAL_TABLET | Freq: Three times a day (TID) | ORAL | 0 refills | Status: DC | PRN

## 2019-03-10 MED ORDER — SULFAMETHOXAZOLE-TRIMETHOPRIM 800-160 MG PO TABS: 1.0000 | ORAL_TABLET | Freq: Two times a day (BID) | ORAL | 0 refills | Status: AC

## 2019-03-10 MED ORDER — SODIUM CHLORIDE 0.9 % IV BOLUS
1000.0000 mL | Freq: Once | INTRAVENOUS | Status: AC
  Administered 2019-03-10: 1000 mL via INTRAVENOUS

## 2019-03-10 MED ORDER — AMLODIPINE BESYLATE 5 MG PO TABS: 5.0000 mg | ORAL_TABLET | Freq: Every day | ORAL | 3 refills | Status: DC

## 2019-03-10 NOTE — ED Notes (Addendum)
Pt reports N/V/D since Friday, intermittent chills. Was started on zofran, HCTZ and lisinopril at walk in clinic.

## 2019-03-10 NOTE — ED Provider Notes (Signed)
[facility] Emergency Department Provider Note    First MD Initiated Contact with Patient 03/10/19 1329     (approximate)  I have reviewed the triage vital signs and the nursing notes.   HISTORY  Chief Complaint Nausea; Dizziness; Emesis; and Diarrhea    HPI Lindsay Guzman is a 53 y.o. female   recently diagnosed hypertension and started on the lisinopril and HCTZ this past Friday.  Patient also admits to vomiting and diarrhea Thursday and Friday of last week.  Patient presents to the emergency department today secondary to dizziness.  Patient denies any headache no weakness no numbness gait instability.  Patient states that she has been taking her medications as prescribed.  Patient denies any chest pain no shortness of breath.        Past Medical History:  Diagnosis Date  . Hypertension     There are no active problems to display for this patient.   Past Surgical History:  Procedure Laterality Date  . ABDOMINAL HYSTERECTOMY    . CHOLECYSTECTOMY      Prior to Admission medications   Medication Sig Start Date End Date Taking? Authorizing Provider  amLODipine (NORVASC) 5 MG tablet Take 1 tablet (5 mg total) by mouth daily. 03/10/19 07/08/19  Darci Current, MD  ondansetron (ZOFRAN ODT) 4 MG disintegrating tablet Take 1 tablet (4 mg total) by mouth every 8 (eight) hours as needed. 03/10/19   Darci Current, MD  sulfamethoxazole-trimethoprim (BACTRIM DS) 800-160 MG tablet Take 1 tablet by mouth 2 (two) times daily for 7 days. 03/10/19 03/17/19  Darci Current, MD    Allergies Other and Penicillins  No family history on file.  Social History Social History   Tobacco Use  . Smoking status: Current Every Day Smoker  Substance Use Topics  . Alcohol use: Yes  . Drug use: Yes    Types: Marijuana    Review of Systems Constitutional: No fever/chills Eyes: No visual changes. ENT: No sore throat. Cardiovascular: Denies chest pain.  Respiratory: Denies shortness of breath. Gastrointestinal: No abdominal pain.  No nausea, no vomiting.  No diarrhea.  No constipation. Genitourinary: Negative for dysuria. Musculoskeletal: Negative for neck pain.  Negative for back pain. Integumentary: Negative for rash. Neurological: Negative for headaches, focal weakness or numbness.  Positive for dizziness   ____________________________________________   PHYSICAL EXAM:  VITAL SIGNS: ED Triage Vitals [03/10/19 1201]  Enc Vitals Group     BP 114/60     Pulse Rate 99     Resp 20     Temp 98.8 F (37.1 C)     Temp Source Oral     SpO2 100 %     Weight 69.9 kg (154 lb)     Height 1.626 m ( )     Head Circumference      Peak Flow      Pain Score 0     Pain Loc      Pain Edu?      Excl. in GC?     Constitutional: Alert and oriented. Well appearing and in no acute distress. Eyes: Conjunctivae are normal. PERRL. EOMI. nystagmus Head: Atraumatic. Mouth/Throat: Mucous membranes are moist.  Oropharynx non-erythematous. Neck: No stridor.  Cardiovascular: Normal rate, regular rhythm. Good peripheral circulation. Grossly normal heart sounds. Respiratory: Normal respiratory effort.  No retractions. No audible wheezing. Gastrointestinal: Soft and nontender. No distention.  Musculoskeletal: No lower extremity tenderness nor edema. No gross deformities of extremities. Neurologic:  Normal  speech and language. No gross focal neurologic deficits are appreciated.  Skin:  Skin is warm, dry and intact. No rash noted. Psychiatric: Mood and affect are normal. Speech and behavior are normal.  ____________________________________________   LABS (all labs ordered are listed, but only abnormal results are displayed)  Labs Reviewed  BASIC METABOLIC PANEL - Abnormal; Notable for the following components:      Result Value   Sodium 133 (*)    Potassium 2.7 (*)    Chloride 90 (*)    Glucose, Bld 106 (*)    BUN 30 (*)    Creatinine,  Ser 1.48 (*)    GFR calc non Af Amer 40 (*)    GFR calc Af Amer 46 (*)    All other components within normal limits  CBC - Abnormal; Notable for the following components:   RBC 5.82 (*)    Hemoglobin 16.4 (*)    HCT 48.2 (*)    All other components within normal limits  URINALYSIS, COMPLETE (UACMP) WITH MICROSCOPIC - Abnormal; Notable for the following components:   Color, Urine YELLOW (*)    APPearance CLOUDY (*)    Hgb urine dipstick MODERATE (*)    Ketones, ur 5 (*)    Nitrite POSITIVE (*)    Leukocytes,Ua LARGE (*)    WBC, UA >50 (*)    Bacteria, UA RARE (*)    All other components within normal limits  CBG MONITORING, ED   ____________________________________________  EKG ED ECG REPORT I, [place] Faria Casella, the attending physician, personally viewed and interpreted this ECG.   Date: 03/10/2019  EKG Time: 11:59 AM  Rate: 88  Rhythm: Normal sinus rhythm, left ventricular hypertrophy  Axis: Normal  Intervals: Normal  ST&T Change: None       Procedures   ____________________________________________   INITIAL IMPRESSION / MDM / ASSESSMENT AND PLAN / ED COURSE  As part of my medical decision making, I reviewed the following data within the electronic MEDICAL RECORD NUMBER   53 year old female presented with above-stated history and physical exam and laboratory data consistent with hypokalemia potassium of 2.7 dehydration and acute cystitis.  Patient given 2 L IV normal saline 20 mEq of potassium IV as well as 40 mEq p.o.  Patient given Bactrim DS and will be prescribed the same for home secondary to UTI.  Reevaluation patient states that she feels "much better".  Patient advised to discontinue taking hydrochlorothiazide secondary to elevated creatinine.  Also patient advised to discontinue taking lisinopril and will be prescribed Norvasc given increased risk of angioedema in the African-American population.     *Lindsay Guzman was evaluated in Emergency Department on  03/10/2019 for the symptoms described in the history of present illness. She was evaluated in the context of the global COVID-19 pandemic, which necessitated consideration that the patient might be at risk for infection with the SARS-CoV-2 virus that causes COVID-19. Institutional protocols and algorithms that pertain to the evaluation of patients at risk for COVID-19 are in a state of rapid change based on information released by regulatory bodies including the CDC and federal and state organizations. These policies and algorithms were followed during the patient's care in the ED.  Some ED evaluations and interventions may be delayed as a result of limited staffing during the pandemic.*        ____________________________________________  FINAL CLINICAL IMPRESSION(S) / ED DIAGNOSES  Final diagnoses:  Acute cystitis without hematuria  Hypokalemia  Dehydration     MEDICATIONS GIVEN  DURING THIS VISIT:  Medications  potassium chloride 10 mEq in 100 mL IVPB (0 mEq Intravenous Stopped 03/10/19 1655)  sodium chloride 0.9 % bolus 1,000 mL (0 mLs Intravenous Stopped 03/10/19 1741)  sodium chloride 0.9 % bolus 1,000 mL (0 mLs Intravenous Stopped 03/10/19 1741)  potassium chloride SA (K-DUR) CR tablet 40 mEq (40 mEq Oral Given 03/10/19 1402)  potassium chloride SA (K-DUR) CR tablet 40 mEq (40 mEq Oral Given 03/10/19 1752)     ED Discharge Orders         Ordered    amLODipine (NORVASC) 5 MG tablet  Daily     03/10/19 1722    ondansetron (ZOFRAN ODT) 4 MG disintegrating tablet  Every 8 hours PRN     03/10/19 1722    sulfamethoxazole-trimethoprim (BACTRIM DS) 800-160 MG tablet  2 times daily     03/10/19 1722           Note:  This document was prepared using Dragon voice recognition software and may include unintentional dictation errors.   Darci Current, MD 03/10/19 2140

## 2019-03-10 NOTE — ED Notes (Signed)
Pt up to bathroom in hallway. Reports feeling better, denies dizziness. Pt standby assist.

## 2019-03-10 NOTE — ED Notes (Signed)
Gave urine cup with bag.

## 2019-03-10 NOTE — ED Triage Notes (Signed)
Pt reports was seen at the clinic on Friday and given meds for NVD. Pt states still with sx's and feeling dizzy.

## 2019-03-10 NOTE — ED Triage Notes (Signed)
Critical K+ of 2.7

## 2020-04-18 ENCOUNTER — Other Ambulatory Visit: Payer: Self-pay

## 2020-04-18 ENCOUNTER — Encounter: Payer: Self-pay | Admitting: Emergency Medicine

## 2020-04-18 ENCOUNTER — Inpatient Hospital Stay
Admission: EM | Admit: 2020-04-18 | Discharge: 2020-04-21 | DRG: 062 | Disposition: A | Discharge status: Home / Self Care | Payer: Self-pay | Attending: Internal Medicine | Admitting: Internal Medicine

## 2020-04-18 ENCOUNTER — Emergency Department: Payer: Self-pay

## 2020-04-18 DIAGNOSIS — I10 Essential (primary) hypertension: Secondary | ICD-10-CM | POA: Diagnosis present

## 2020-04-18 DIAGNOSIS — I639 Cerebral infarction, unspecified: Principal | ICD-10-CM | POA: Diagnosis present

## 2020-04-18 DIAGNOSIS — Z9109 Other allergy status, other than to drugs and biological substances: Secondary | ICD-10-CM

## 2020-04-18 DIAGNOSIS — Z90710 Acquired absence of both cervix and uterus: Secondary | ICD-10-CM

## 2020-04-18 DIAGNOSIS — R29707 NIHSS score 7: Secondary | ICD-10-CM | POA: Diagnosis present

## 2020-04-18 DIAGNOSIS — I739 Peripheral vascular disease, unspecified: Secondary | ICD-10-CM | POA: Diagnosis present

## 2020-04-18 DIAGNOSIS — I160 Hypertensive urgency: Secondary | ICD-10-CM

## 2020-04-18 DIAGNOSIS — R29700 NIHSS score 0: Secondary | ICD-10-CM | POA: Diagnosis not present

## 2020-04-18 DIAGNOSIS — R319 Hematuria, unspecified: Secondary | ICD-10-CM | POA: Diagnosis present

## 2020-04-18 DIAGNOSIS — Z9114 Patient's other noncompliance with medication regimen: Secondary | ICD-10-CM

## 2020-04-18 DIAGNOSIS — F17200 Nicotine dependence, unspecified, uncomplicated: Secondary | ICD-10-CM | POA: Diagnosis present

## 2020-04-18 DIAGNOSIS — Z7982 Long term (current) use of aspirin: Secondary | ICD-10-CM

## 2020-04-18 DIAGNOSIS — Z880 Allergy status to penicillin: Secondary | ICD-10-CM

## 2020-04-18 DIAGNOSIS — G8191 Hemiplegia, unspecified affecting right dominant side: Secondary | ICD-10-CM | POA: Diagnosis present

## 2020-04-18 DIAGNOSIS — Z20822 Contact with and (suspected) exposure to covid-19: Secondary | ICD-10-CM | POA: Diagnosis present

## 2020-04-18 DIAGNOSIS — R29810 Facial weakness: Secondary | ICD-10-CM | POA: Diagnosis present

## 2020-04-18 DIAGNOSIS — Z8673 Personal history of transient ischemic attack (TIA), and cerebral infarction without residual deficits: Secondary | ICD-10-CM

## 2020-04-18 HISTORY — DX: Personal history of transient ischemic attack (TIA), and cerebral infarction without residual deficits: Z86.73

## 2020-04-18 LAB — COMPREHENSIVE METABOLIC PANEL
ALT: 12 U/L (ref 0–44)
AST: 19 U/L (ref 15–41)
Albumin: 4.2 g/dL (ref 3.5–5.0)
Alkaline Phosphatase: 72 U/L (ref 38–126)
Anion gap: 10 (ref 5–15)
BUN: 13 mg/dL (ref 6–20)
CO2: 26 mmol/L (ref 22–32)
Calcium: 8.9 mg/dL (ref 8.9–10.3)
Chloride: 102 mmol/L (ref 98–111)
Creatinine, Ser: 0.87 mg/dL (ref 0.44–1.00)
GFR calc Af Amer: >60 mL/min (ref >60)
GFR calc non Af Amer: >60 mL/min (ref >60)
Glucose, Bld: 109 mg/dL — ABNORMAL HIGH (ref 70–99)
Potassium: 3.3 mmol/L — ABNORMAL LOW (ref 3.5–5.1)
Sodium: 138 mmol/L (ref 135–145)
Total Bilirubin: 0.8 mg/dL (ref 0.3–1.2)
Total Protein: 7.9 g/dL (ref 6.5–8.1)

## 2020-04-18 LAB — URINE DRUG SCREEN, QUALITATIVE (ARMC ONLY)
Amphetamines, Ur Screen: NOT DETECTED
Barbiturates, Ur Screen: NOT DETECTED
Benzodiazepine, Ur Scrn: NOT DETECTED
Cannabinoid 50 Ng, Ur ~~LOC~~: POSITIVE — AB
Cocaine Metabolite,Ur ~~LOC~~: NOT DETECTED
MDMA (Ecstasy)Ur Screen: NOT DETECTED
Methadone Scn, Ur: NOT DETECTED
Opiate, Ur Screen: NOT DETECTED
Phencyclidine (PCP) Ur S: NOT DETECTED
Tricyclic, Ur Screen: NOT DETECTED

## 2020-04-18 LAB — PROTIME-INR
INR: 1.1 (ref 0.8–1.2)
Prothrombin Time: 13.6 seconds (ref 11.4–15.2)

## 2020-04-18 LAB — DIFFERENTIAL
Abs Immature Granulocytes: 0.02 10*3/uL (ref 0.00–0.07)
Basophils Absolute: 0 10*3/uL (ref 0.0–0.1)
Basophils Relative: 1 %
Eosinophils Absolute: 0.1 10*3/uL (ref 0.0–0.5)
Eosinophils Relative: 1 %
Immature Granulocytes: 0 %
Lymphocytes Relative: 45 %
Lymphs Abs: 3.7 10*3/uL (ref 0.7–4.0)
Monocytes Absolute: 0.6 10*3/uL (ref 0.1–1.0)
Monocytes Relative: 7 %
Neutro Abs: 3.8 10*3/uL (ref 1.7–7.7)
Neutrophils Relative %: 46 %

## 2020-04-18 LAB — APTT: aPTT: 28 seconds (ref 24–36)

## 2020-04-18 LAB — CBC
HCT: 41.9 % (ref 36.0–46.0)
Hemoglobin: 14.4 g/dL (ref 12.0–15.0)
MCH: 28.2 pg (ref 26.0–34.0)
MCHC: 34.4 g/dL (ref 30.0–36.0)
MCV: 82 fL (ref 80.0–100.0)
Platelets: 248 10*3/uL (ref 150–400)
RBC: 5.11 MIL/uL (ref 3.87–5.11)
RDW: 13.7 % (ref 11.5–15.5)
WBC: 8.3 10*3/uL (ref 4.0–10.5)
nRBC: 0 % (ref 0.0–0.2)

## 2020-04-18 LAB — SARS CORONAVIRUS 2 BY RT PCR (HOSPITAL ORDER, PERFORMED IN ~~LOC~~ HOSPITAL LAB): SARS Coronavirus 2: NEGATIVE

## 2020-04-18 LAB — GLUCOSE, CAPILLARY: Glucose-Capillary: 99 mg/dL (ref 70–99)

## 2020-04-18 LAB — TROPONIN I (HIGH SENSITIVITY)
Troponin I (High Sensitivity): 10 ng/L (ref <18)
Troponin I (High Sensitivity): 13 ng/L (ref <18)

## 2020-04-18 IMAGING — CT CT ANGIO HEAD-NECK
2 of 8 series · 8 of 33 positions shown · IV contrast (APPLIED)
Comparison: Plain head CT [D2] hours today.

CLINICAL DATA: 54-year-old female code stroke presentation with
right side symptoms last known well [D2] hours.

EXAM:
CT ANGIOGRAPHY HEAD AND NECK
TECHNIQUE: Multidetector CT imaging of the head and neck was performed using
the standard protocol during bolus administration of intravenous
contrast. Multiplanar CT image reconstructions and MIPs were
obtained to evaluate the vascular anatomy. Carotid stenosis
measurements (when applicable) are obtained utilizing NASCET
criteria, using the distal internal carotid diameter as the
denominator.
CONTRAST:  75mL OMNIPAQUE IOHEXOL 350 MG/ML SOLN

[Series 504: cta head neck thins · axial · 0.37mm/px · z∈[-307,-86]mm · 6 of 652 slices shown]
[im 94/652  soft-tissue]
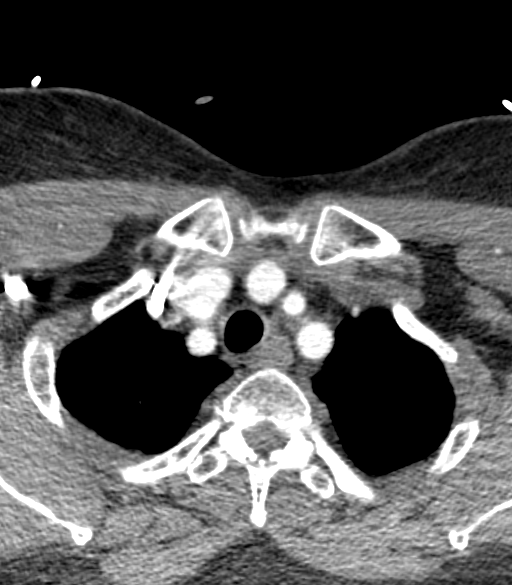
[im 187/652  bone]
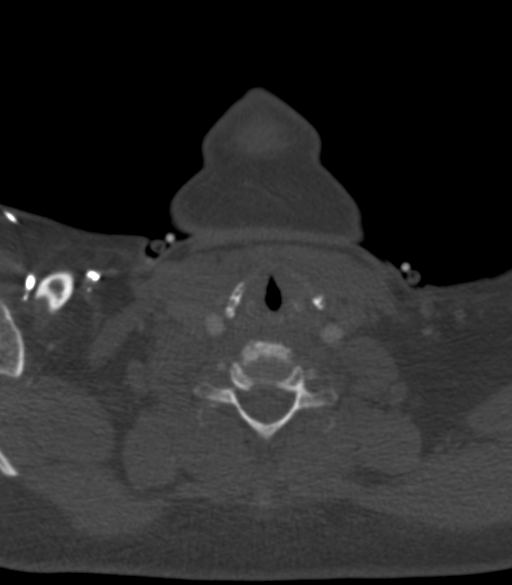
[im 280/652  soft-tissue]
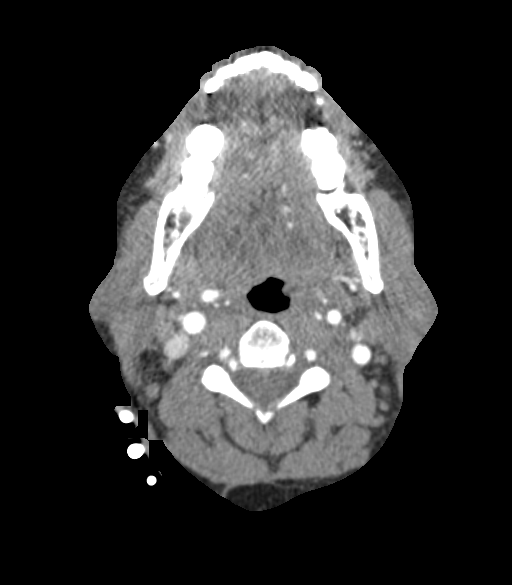
[im 373/652  bone]
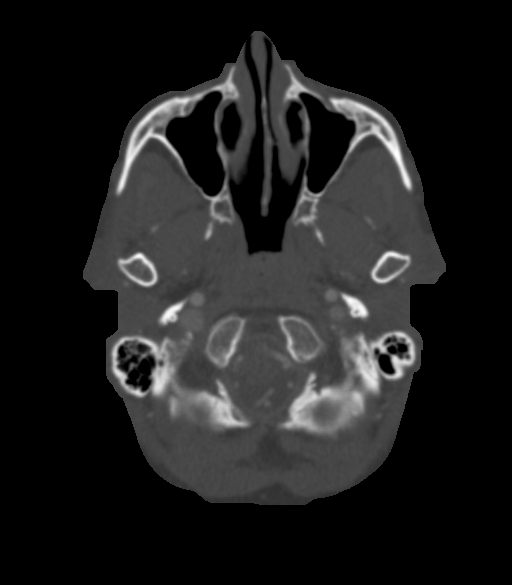
[im 466/652  soft-tissue]
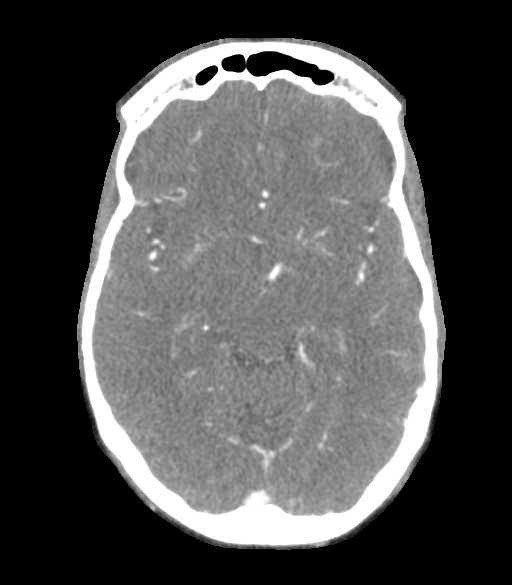
[im 559/652  bone]
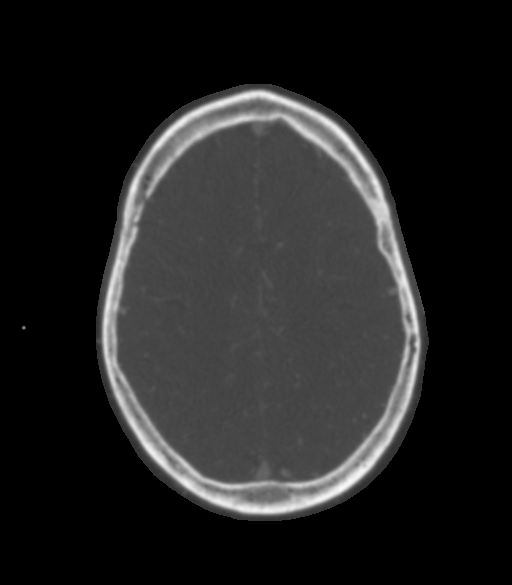

[Series 505: ax thin · axial · 0.37mm/px · z∈[-249,-146]mm · 2 of 326 slices shown]
[im 109/326  soft-tissue]
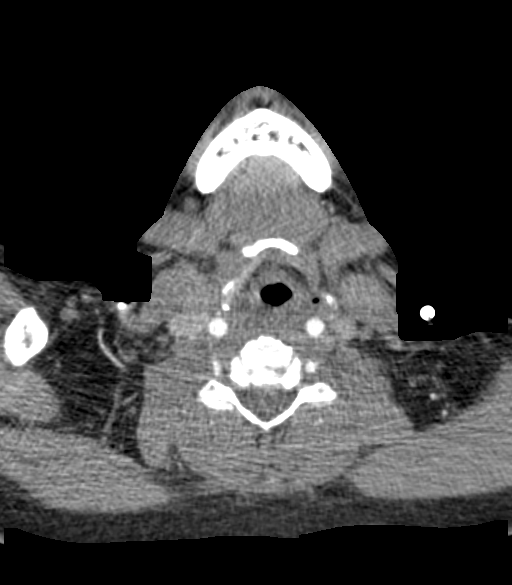
[im 217/326  soft-tissue]
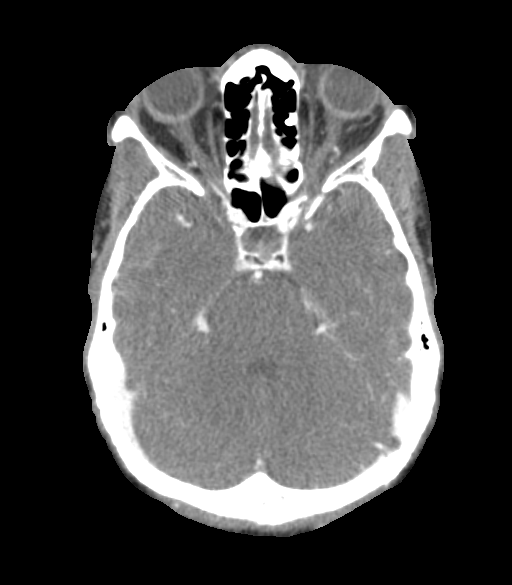

[8 of 33 positions shown; findings below may reference images not displayed]

FINDINGS: CTA NECK

Skeleton: Negative.

Upper chest: Negative.

Other neck: Mild thyroid enlargement but no discrete thyroid nodule.
Otherwise negative CT appearance of the neck soft tissues.

Aortic arch: 3 vessel arch configuration with minimal arch
atherosclerosis.

Right carotid system: Tortuous brachiocephalic artery and proximal
right CCA with no plaque or stenosis. Negative right carotid
bifurcation. Tortuous right ICA distal to the bulb with a kinked
appearance at the C2-C3 level (series 12, image 23), but no plaque
or stenosis to the skull base.

Left carotid system: Tortuous proximal left CCA. Mildly tortuous
left ICA. No plaque or stenosis.

Vertebral arteries:
Tortuous proximal right subclavian artery with no plaque or
stenosis. Normal right vertebral artery origin. Tortuous right V2
segment, but otherwise normal right vertebral to the skull base.

Mildly tortuous proximal left subclavian artery with a normal left
vertebral artery origin. Tortuous left V1 and V2 segments. The left
vertebral appears mildly dominant and otherwise normal to the skull
base.

CTA HEAD

Posterior circulation: Normal distal vertebral arteries and PICA
origins. Normal vertebrobasilar junction. Patent basilar artery.
Patent AICA, SCA and left PCA origins. Fetal type right PCA origin.
Left posterior communicating artery diminutive or absent. Bilateral
PCA branches are within normal limits, the right posterior
communicating artery is mildly tortuous and irregular (series 13,
image 18) without stenosis.

Anterior circulation: Both ICA siphons are patent. No siphon plaque
or stenosis. Normal ophthalmic and left posterior communicating
artery origins. Patent carotid termini, MCA and ACA origins.
Tortuous ACA A1 segments. Anterior communicating artery and
bilateral ACA branches are within normal limits. Left MCA M1 segment
is tortuous. Left MCA bifurcates without stenosis. Left MCA branches
are within normal limits. Right MCA M1 segment is mildly tortuous.
Right MCA trifurcation is patent without stenosis. Right MCA
branches are within normal limits.

Venous sinuses: Patent.

Anatomic variants: Mildly dominant left vertebral artery. Fetal
right PCA origin.

Review of the MIP images confirms the above findings
IMPRESSION: Negative for large vessel occlusion.
Generalized arterial tortuosity in the head and neck, but no
atherosclerosis or stenosis.

## 2020-04-18 IMAGING — CT CT HEAD CODE STROKE
3 series · 14 of 46 positions shown, 16 images · non-contrast
Comparison: Head CT [DATE].
COMPARISON: Head CT [DATE].

Addendum:
CLINICAL DATA: Code stroke. 54-year-old female with right side
weakness last known well [TL] hours.

EXAM:
CT HEAD WITHOUT CONTRAST
TECHNIQUE: Contiguous axial images were obtained from the base of the skull
through the vertex without intravenous contrast.

[Series 2: head wo · axial · 0.43mm/px · z∈[-207,-87]mm · 8 of 29 slices shown, 10 images]
[im 3/29  brain]
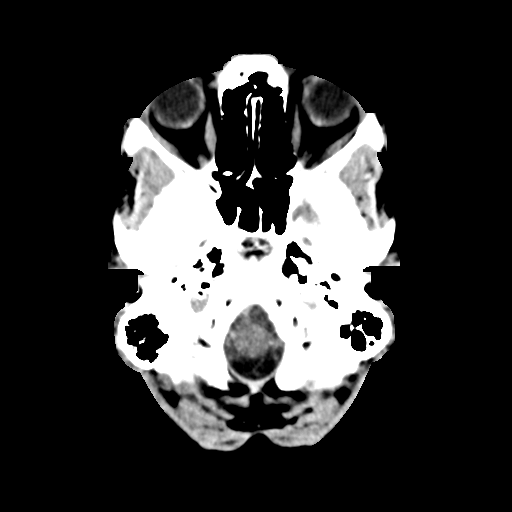
[im 3/29  bone]
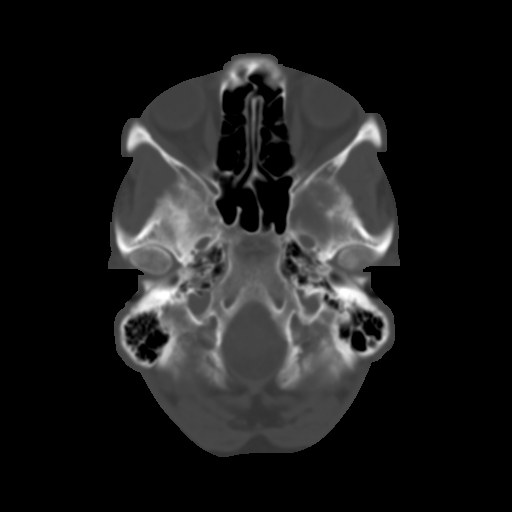
[im 7/29  brain]
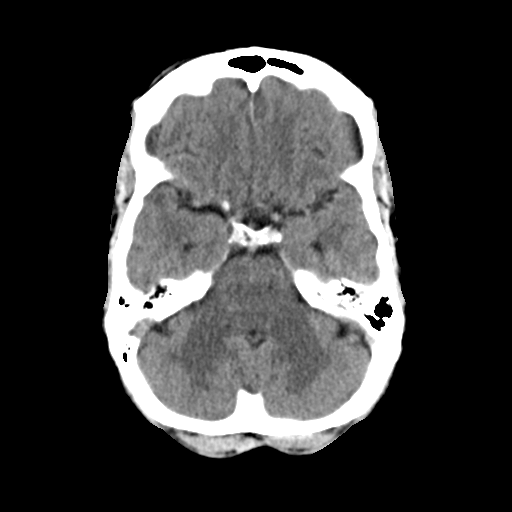
[im 10/29  brain]
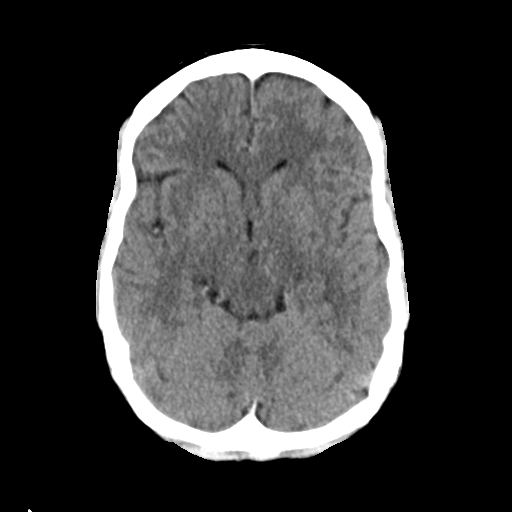
[im 13/29  brain]
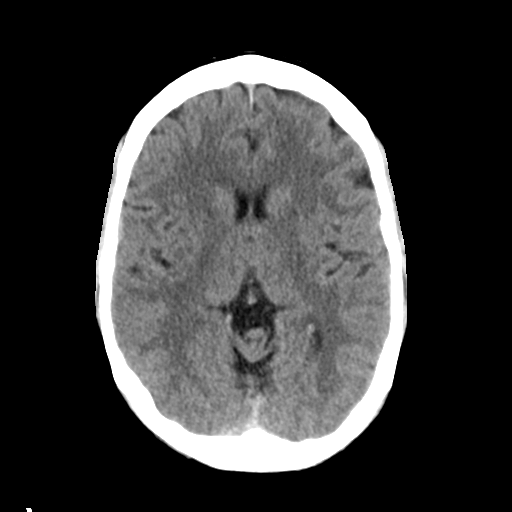
[im 17/29  brain]
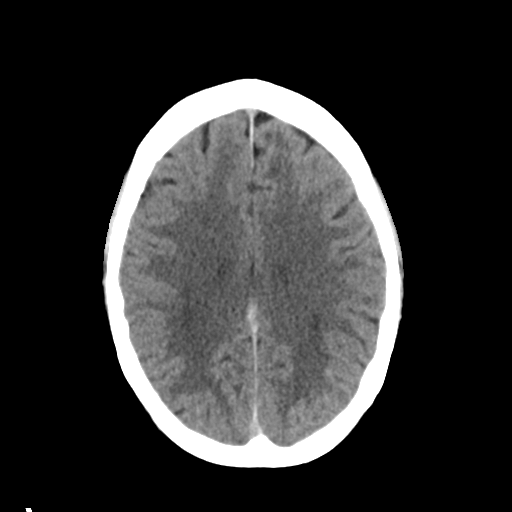
[im 17/29  bone]
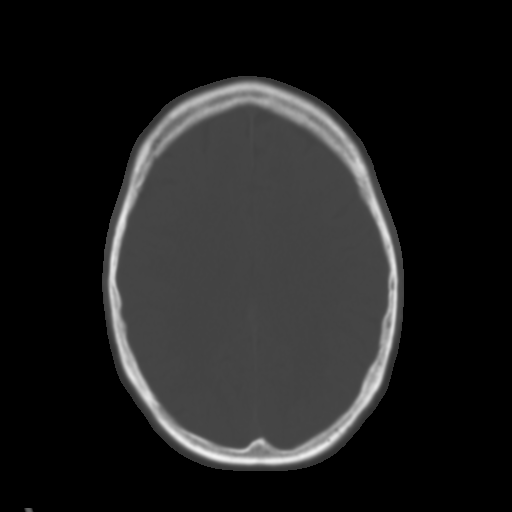
[im 20/29  brain]
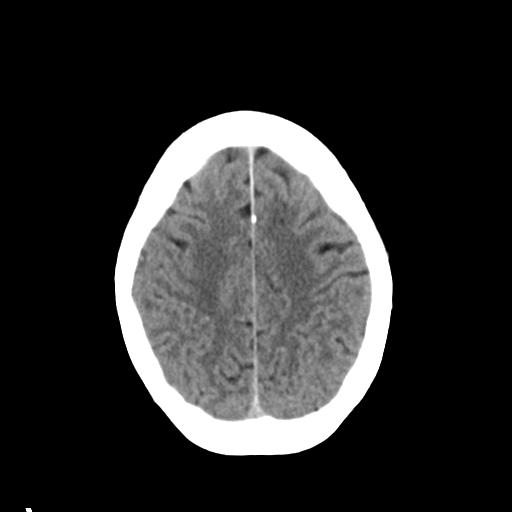
[im 23/29  brain]
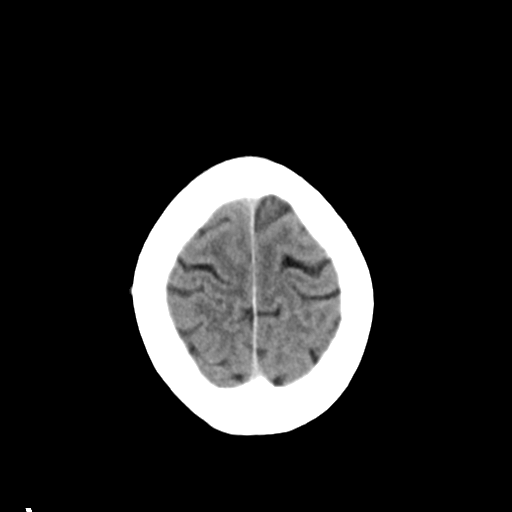
[im 27/29  brain]
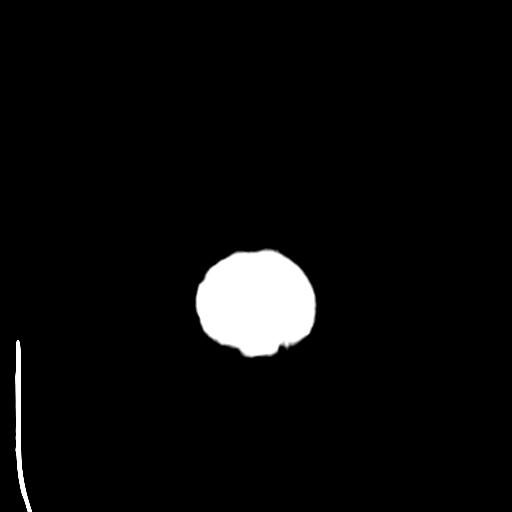

[Series 4: coronal soft tissue · coronal · 0.30mm/px · 3 of 62 slices shown]
[im 21/62  brain]
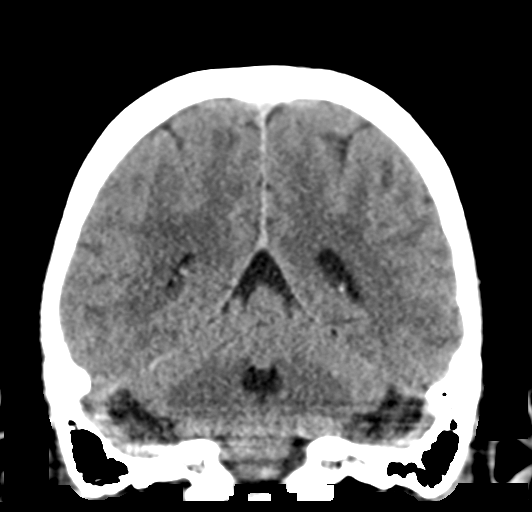
[im 28/62  brain]
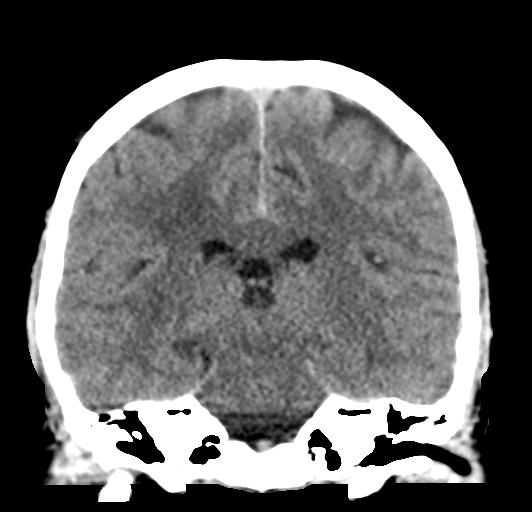
[im 34/62  brain]
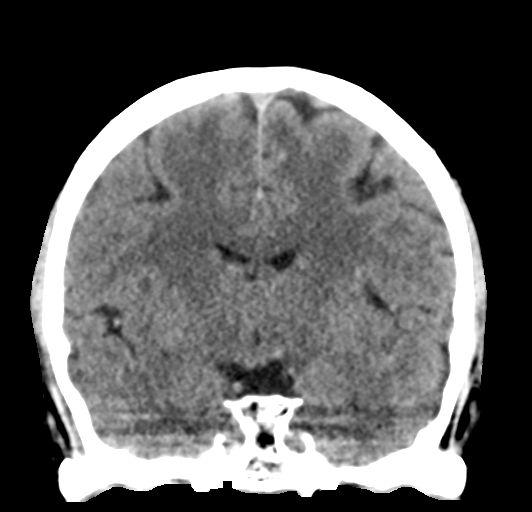

[Series 5: sagittal soft tissue · sagittal · 0.30mm/px · 3 of 48 slices shown]
[im 16/48  brain]
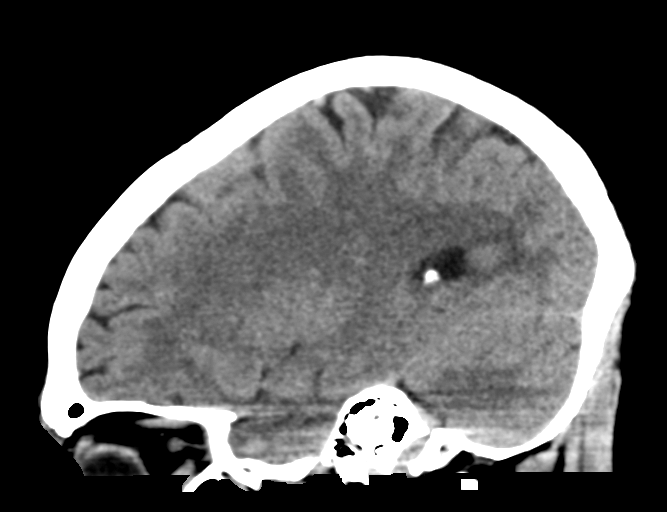
[im 24/48  brain]
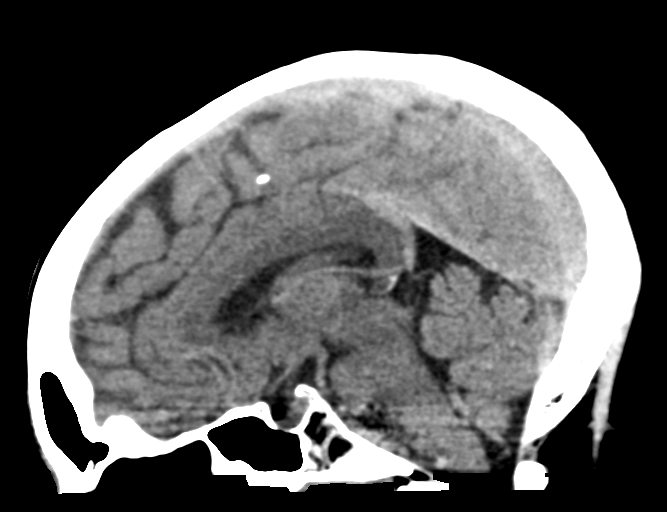
[im 32/48  brain]
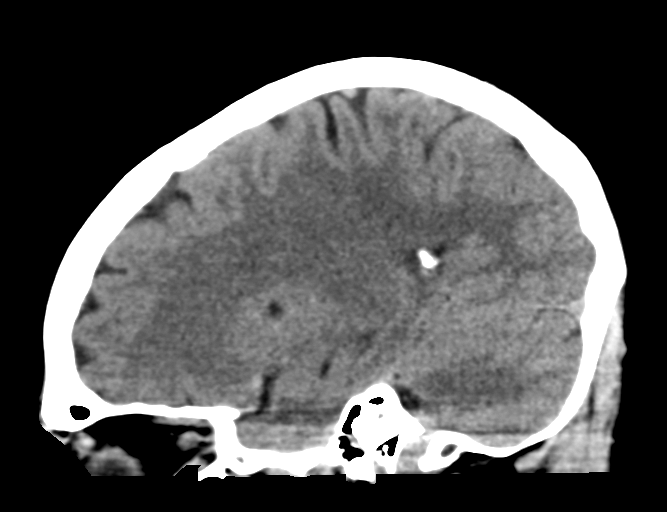

[14 of 46 positions shown; findings below may reference images not displayed]

FINDINGS: Brain: Cerebral volume remains normal for age. No midline shift,
mass effect, or evidence of intracranial mass lesion. No
ventriculomegaly. No acute intracranial hemorrhage identified.

Patchy posterior hemisphere subcortical white matter hypodensity has
increased since [TL]. No cortically based acute infarct identified.
No cortical encephalomalacia identified. The deep gray nuclei remain
within normal limits.

Vascular: No suspicious intracranial vascular hyperdensity.

Skull: Negative.

Sinuses/Orbits: Visualized paranasal sinuses and mastoids are stable
and well pneumatized.

Other: Visualized orbits and scalp soft tissues are within normal
limits.

ASPECTS ([place] Stroke Program Early CT Score)

Total score (0-10 with 10 being normal): 10
IMPRESSION: 1. No acute cortically based infarct or acute intracranial
hemorrhage identified. ASPECTS 10.
2. Progressed nonspecific cerebral white matter changes since [TL].

ADDENDUM:
Study discussed by telephone with Dr. MAMIKO on [DATE] at
[TL] hours.

*** End of Addendum ***
FINDINGS: Brain: Cerebral volume remains normal for age. No midline shift,
mass effect, or evidence of intracranial mass lesion. No
ventriculomegaly. No acute intracranial hemorrhage identified.

Patchy posterior hemisphere subcortical white matter hypodensity has
increased since [TL]. No cortically based acute infarct identified.
No cortical encephalomalacia identified. The deep gray nuclei remain
within normal limits.

Vascular: No suspicious intracranial vascular hyperdensity.

Skull: Negative.

Sinuses/Orbits: Visualized paranasal sinuses and mastoids are stable
and well pneumatized.

Other: Visualized orbits and scalp soft tissues are within normal
limits.

ASPECTS ([place] Stroke Program Early CT Score)

Total score (0-10 with 10 being normal): 10
IMPRESSION: 1. No acute cortically based infarct or acute intracranial
hemorrhage identified. ASPECTS 10.
2. Progressed nonspecific cerebral white matter changes since [DATE].

## 2020-04-18 IMAGING — CT CT ANGIO HEAD-NECK
2 of 7 series · 8 of 33 positions shown · IV contrast (APPLIED)
Comparison: Plain head CT [D2] hours today.

CLINICAL DATA: 54-year-old female code stroke presentation with
right side symptoms last known well [D2] hours.

EXAM:
CT ANGIOGRAPHY HEAD AND NECK
TECHNIQUE: Multidetector CT imaging of the head and neck was performed using
the standard protocol during bolus administration of intravenous
contrast. Multiplanar CT image reconstructions and MIPs were
obtained to evaluate the vascular anatomy. Carotid stenosis
measurements (when applicable) are obtained utilizing NASCET
criteria, using the distal internal carotid diameter as the
denominator.
CONTRAST:  75mL OMNIPAQUE IOHEXOL 350 MG/ML SOLN

[Series 6: cta head neck · axial · 0.45mm/px · z∈[-209,-107]mm · 2 of 155 slices shown]
[im 52/155  soft-tissue]
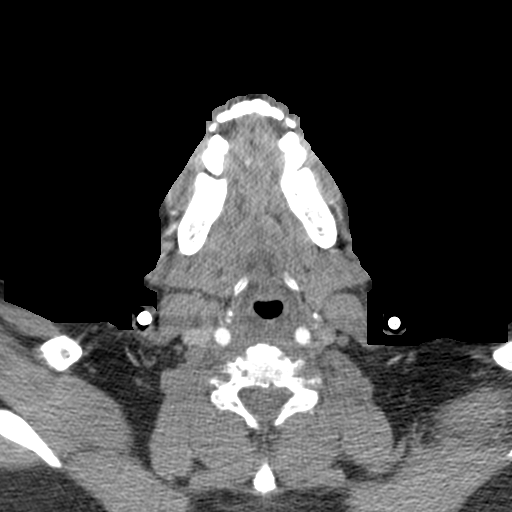
[im 103/155  soft-tissue]
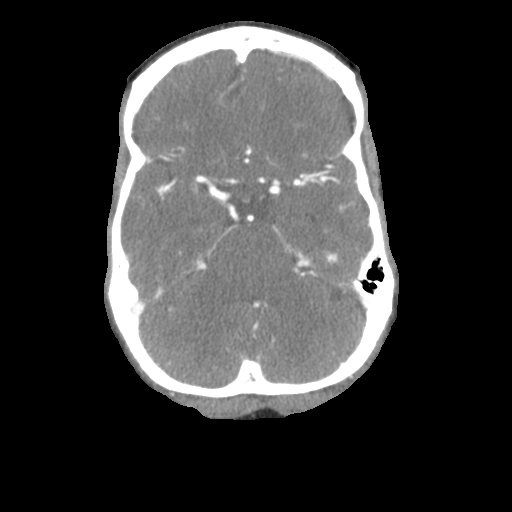

[Series 8: ax thin · axial · 0.37mm/px · z∈[-308,-87]mm · 6 of 326 slices shown]
[im 47/326  soft-tissue]
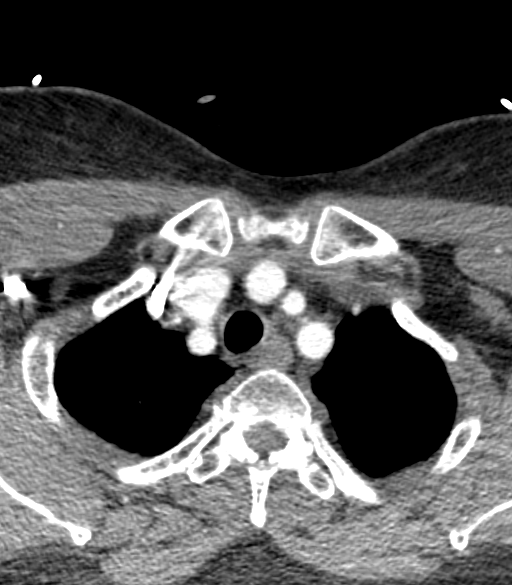
[im 93/326  bone]
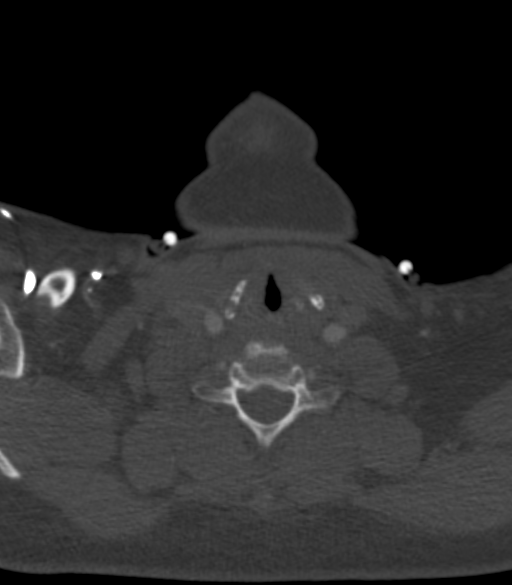
[im 140/326  soft-tissue]
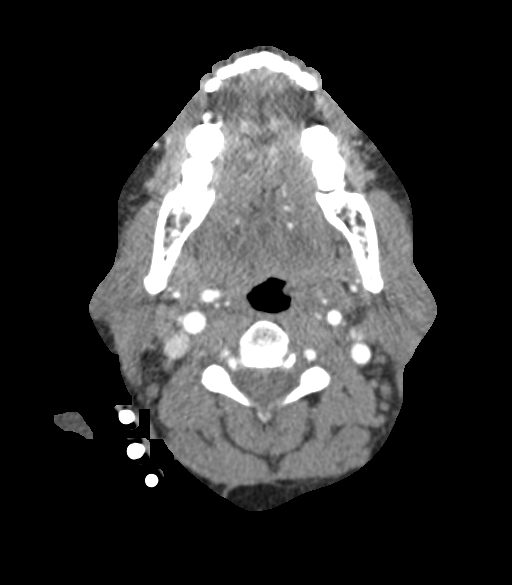
[im 186/326  bone]
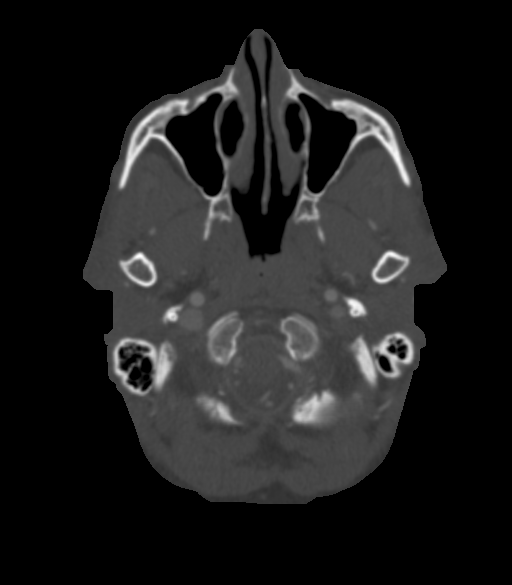
[im 233/326  soft-tissue]
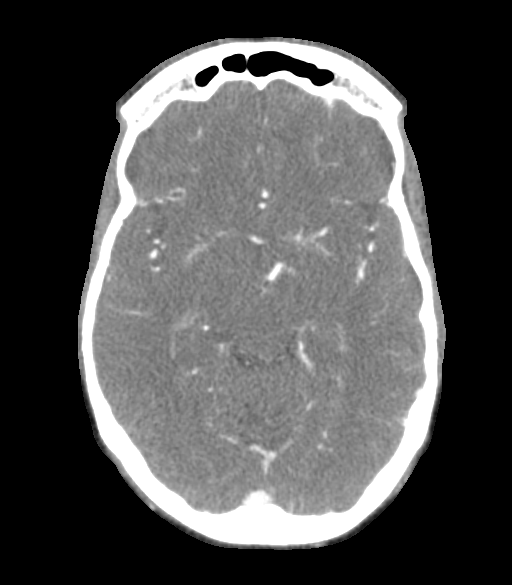
[im 279/326  bone]
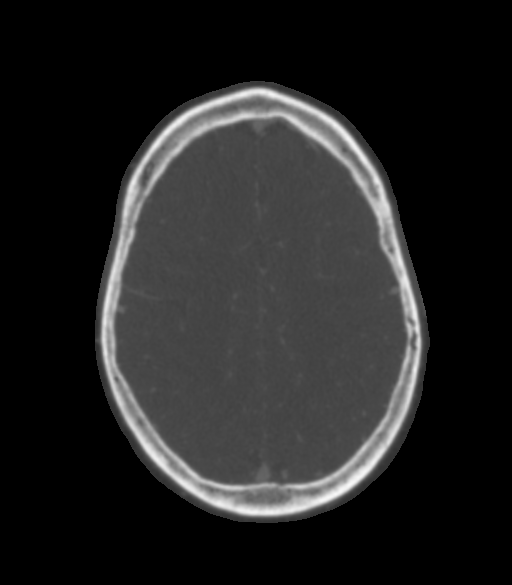

[8 of 33 positions shown; findings below may reference images not displayed]

FINDINGS: CTA NECK

Skeleton: Negative.

Upper chest: Negative.

Other neck: Mild thyroid enlargement but no discrete thyroid nodule.
Otherwise negative CT appearance of the neck soft tissues.

Aortic arch: 3 vessel arch configuration with minimal arch
atherosclerosis.

Right carotid system: Tortuous brachiocephalic artery and proximal
right CCA with no plaque or stenosis. Negative right carotid
bifurcation. Tortuous right ICA distal to the bulb with a kinked
appearance at the C2-C3 level (series 12, image 23), but no plaque
or stenosis to the skull base.

Left carotid system: Tortuous proximal left CCA. Mildly tortuous
left ICA. No plaque or stenosis.

Vertebral arteries:
Tortuous proximal right subclavian artery with no plaque or
stenosis. Normal right vertebral artery origin. Tortuous right V2
segment, but otherwise normal right vertebral to the skull base.

Mildly tortuous proximal left subclavian artery with a normal left
vertebral artery origin. Tortuous left V1 and V2 segments. The left
vertebral appears mildly dominant and otherwise normal to the skull
base.

CTA HEAD

Posterior circulation: Normal distal vertebral arteries and PICA
origins. Normal vertebrobasilar junction. Patent basilar artery.
Patent AICA, SCA and left PCA origins. Fetal type right PCA origin.
Left posterior communicating artery diminutive or absent. Bilateral
PCA branches are within normal limits, the right posterior
communicating artery is mildly tortuous and irregular (series 13,
image 18) without stenosis.

Anterior circulation: Both ICA siphons are patent. No siphon plaque
or stenosis. Normal ophthalmic and left posterior communicating
artery origins. Patent carotid termini, MCA and ACA origins.
Tortuous ACA A1 segments. Anterior communicating artery and
bilateral ACA branches are within normal limits. Left MCA M1 segment
is tortuous. Left MCA bifurcates without stenosis. Left MCA branches
are within normal limits. Right MCA M1 segment is mildly tortuous.
Right MCA trifurcation is patent without stenosis. Right MCA
branches are within normal limits.

Venous sinuses: Patent.

Anatomic variants: Mildly dominant left vertebral artery. Fetal
right PCA origin.

Review of the MIP images confirms the above findings
IMPRESSION: Negative for large vessel occlusion.
Generalized arterial tortuosity in the head and neck, but no
atherosclerosis or stenosis.

## 2020-04-18 MED ORDER — SODIUM CHLORIDE 0.9 % IV SOLN
50.0000 mL | Freq: Once | INTRAVENOUS | Status: AC
  Administered 2020-04-18: 50 mL via INTRAVENOUS

## 2020-04-18 MED ORDER — ASPIRIN 81 MG PO CHEW
324.0000 mg | CHEWABLE_TABLET | ORAL | Status: AC
  Administered 2020-04-18: 324 mg via ORAL
  Filled 2020-04-18: qty 4

## 2020-04-18 MED ORDER — CLONIDINE HCL 0.1 MG PO TABS
0.1000 mg | ORAL_TABLET | Freq: Three times a day (TID) | ORAL | Status: DC
  Administered 2020-04-18 – 2020-04-19 (×3): 0.1 mg via ORAL
  Filled 2020-04-18 (×3): qty 1

## 2020-04-18 MED ORDER — ASPIRIN 300 MG RE SUPP: 300.0000 mg | RECTAL | Status: AC

## 2020-04-18 MED ORDER — SODIUM CHLORIDE 0.9% FLUSH
3.0000 mL | Freq: Once | INTRAVENOUS | Status: AC
Start: 2020-04-18 — End: 2020-04-18
  Administered 2020-04-18: 3 mL via INTRAVENOUS

## 2020-04-18 MED ORDER — IOHEXOL 350 MG/ML SOLN
75.0000 mL | Freq: Once | INTRAVENOUS | Status: AC | PRN
  Administered 2020-04-18: 75 mL via INTRAVENOUS

## 2020-04-18 MED ORDER — CLEVIDIPINE BUTYRATE 0.5 MG/ML IV EMUL
INTRAVENOUS | Status: AC
  Administered 2020-04-18: 1 mg/h via INTRAVENOUS
  Filled 2020-04-18: qty 50

## 2020-04-18 MED ORDER — ALTEPLASE (STROKE) FULL DOSE INFUSION
0.9000 mg/kg | Freq: Once | INTRAVENOUS | Status: AC
  Administered 2020-04-18: 7.7 mg via INTRAVENOUS
  Administered 2020-04-18: 69.2 mg via INTRAVENOUS

## 2020-04-18 MED ORDER — ATORVASTATIN CALCIUM 20 MG PO TABS
40.0000 mg | ORAL_TABLET | Freq: Every day | ORAL | Status: DC
  Administered 2020-04-18 – 2020-04-21 (×4): 40 mg via ORAL
  Filled 2020-04-18 (×4): qty 2

## 2020-04-18 MED ORDER — POLYETHYLENE GLYCOL 3350 17 G PO PACK: 17.0000 g | PACK | Freq: Every day | ORAL | Status: DC | PRN

## 2020-04-18 MED ORDER — DOCUSATE SODIUM 100 MG PO CAPS: 100.0000 mg | ORAL_CAPSULE | Freq: Two times a day (BID) | ORAL | Status: DC | PRN

## 2020-04-18 MED ORDER — CLEVIDIPINE BUTYRATE 0.5 MG/ML IV EMUL: 1.0000 mg/h | INTRAVENOUS | Status: DC

## 2020-04-18 NOTE — Consult Note (Addendum)
Requesting Physician: Darnelle Catalan    Chief Complaint: Right sided numbness and weakness  I have been asked by Dr. Darnelle Catalan to see this patient in consultation for code stroke.  HPI: Lindsay Guzman is an 54 y.o. female with a history of tobacco abuse and HTN (noncompliant with medications) who reports that she awakened today at baseline.  After taking her shower noted that she was weak on the right and had right sided numbness.  Patient was brought in for evaluation at that time.  Initial NIHSS of 7.    Date last known well: 04/18/2020 Time last known well: Time: 08:30 tPA Given: Yes  Past Medical History:  Diagnosis Date  . Hypertension     Past Surgical History:  Procedure Laterality Date  . ABDOMINAL HYSTERECTOMY    . CHOLECYSTECTOMY      History reviewed. No pertinent family history. Social History:  reports that she has been smoking. She does not have any smokeless tobacco history on file. She reports current alcohol use. She reports current drug use. Drug: Marijuana.  Allergies:  Allergies  Allergen Reactions  . Other Anaphylaxis and Swelling    Nuts   . Penicillins Hives    Medications: I have reviewed the patient's current medications. None  ROS: History obtained from the patient  General ROS: negative for - chills, fatigue, fever, night sweats, weight gain or weight loss Psychological ROS: negative for - behavioral disorder, hallucinations, memory difficulties, mood swings or suicidal ideation Ophthalmic ROS: negative for - blurry vision, double vision, eye pain or loss of vision ENT ROS: negative for - epistaxis, nasal discharge, oral lesions, sore throat, tinnitus or vertigo Allergy and Immunology ROS: negative for - hives or itchy/watery eyes Hematological and Lymphatic ROS: negative for - bleeding problems, bruising or swollen lymph nodes Endocrine ROS: negative for - galactorrhea, hair pattern changes, polydipsia/polyuria or temperature intolerance Respiratory  ROS: negative for - cough, hemoptysis, shortness of breath or wheezing Cardiovascular ROS: negative for - chest pain, dyspnea on exertion, edema or irregular heartbeat Gastrointestinal ROS: negative for - abdominal pain, diarrhea, hematemesis, nausea/vomiting or stool incontinence Genito-Urinary ROS: negative for - dysuria, hematuria, incontinence or urinary frequency/urgency Musculoskeletal ROS: negative for - joint swelling or muscular weakness Neurological ROS: as noted in HPI Dermatological ROS: negative for rash and skin lesion changes  Physical Examination: Blood pressure (!) 178/111, pulse 86, temperature 98.1 F (36.7 C), temperature source Oral, resp. rate 17, height 5\' 5"  (1.651 m), weight 76.9 kg, SpO2 100 %.  HEENT-  Normocephalic, no lesions, without obvious abnormality.  Normal external eye and conjunctiva.  Normal TM's bilaterally.  Normal auditory canals and external ears. Normal external nose, mucus membranes and septum.  Normal pharynx. Cardiovascular- S1, S2 normal, pulses palpable throughout   Lungs- chest clear, no wheezing, rales, normal symmetric air entry Abdomen- soft, non-tender; bowel sounds normal; no masses,  no organomegaly Extremities- no edema Lymph-no adenopathy palpable Musculoskeletal-no joint tenderness, deformity or swelling Skin-warm and dry, no hyperpigmentation, vitiligo, or suspicious lesions  Neurological Examination   Mental Status: Alert, oriented, thought content appropriate.  Speech fluent without evidence of aphasia.  Able to follow 3 step commands without difficulty. Cranial Nerves: II: Discs flat bilaterally; Visual fields grossly normal, pupils equal, round, reactive to light and accommodation III,IV, VI: ptosis not present, extra-ocular motions intact bilaterally V,VII: right facial droop, facial light touch sensation decreased on the right VIII: hearing normal bilaterally IX,X: gag reflex present XI: bilateral shoulder shrug XII:  midline tongue extension Motor:  Right : Upper extremity   4-/5    Left:     Upper extremity   5/5  Lower extremity   2/5     Lower extremity   5/5 Tone and bulk:normal tone throughout; no atrophy noted Sensory: Pinprick and light touch decreased on the right upper and lower extremity Deep Tendon Reflexes: Symmetric throughout Plantars: Right: mute   Left: mute Cerebellar: Normal finger-to-nose and normal heel-to-shin testing on the left Gait: not tested due to safety concerns   Laboratory Studies:  Basic Metabolic Panel: Recent Labs  Lab 04/18/20 0913  NA 138  K 3.3*  CL 102  CO2 26  GLUCOSE 109*  BUN 13  CREATININE 0.87  CALCIUM 8.9    Liver Function Tests: Recent Labs  Lab 04/18/20 0913  AST 19  ALT 12  ALKPHOS 72  BILITOT 0.8  PROT 7.9  ALBUMIN 4.2   No results for input(s): LIPASE, AMYLASE in the last 168 hours. No results for input(s): AMMONIA in the last 168 hours.  CBC: Recent Labs  Lab 04/18/20 0913  WBC 8.3  NEUTROABS 3.8  HGB 14.4  HCT 41.9  MCV 82.0  PLT 248    Cardiac Enzymes: No results for input(s): CKTOTAL, CKMB, CKMBINDEX, TROPONINI in the last 168 hours.  BNP: Invalid input(s): POCBNP  CBG: Recent Labs  Lab 04/18/20 0916  GLUCAP 99    Microbiology: No results found for this or any previous visit.  Coagulation Studies: Recent Labs    04/18/20 0947  LABPROT 13.6  INR 1.1    Urinalysis: No results for input(s): COLORURINE, LABSPEC, PHURINE, GLUCOSEU, HGBUR, BILIRUBINUR, KETONESUR, PROTEINUR, UROBILINOGEN, NITRITE, LEUKOCYTESUR in the last 168 hours.  Invalid input(s): APPERANCEUR  Lipid Panel: No results found for: CHOL, TRIG, HDL, CHOLHDL, VLDL, LDLCALC  HgbA1C: No results found for: HGBA1C  Urine Drug Screen:  No results found for: LABOPIA, COCAINSCRNUR, LABBENZ, AMPHETMU, THCU, LABBARB  Alcohol Level: No results for input(s): ETH in the last 168 hours.  Other results: EKG: sinus rhythm at 87 bpm with prolonged  QT interval.  Imaging: CT HEAD CODE STROKE WO CONTRAST  Addendum Date: 04/18/2020   ADDENDUM REPORT: 04/18/2020 09:43 ADDENDUM: Study discussed by telephone with Dr. Dorothea Glassman on 04/18/2020 at 0941 hours. Electronically Signed   By: Odessa Fleming M.D.   On: 04/18/2020 09:43   Result Date: 04/18/2020 CLINICAL DATA:  Code stroke. 54 year old female with right side weakness last known well 0830 hours. EXAM: CT HEAD WITHOUT CONTRAST TECHNIQUE: Contiguous axial images were obtained from the base of the skull through the vertex without intravenous contrast. COMPARISON:  Head CT 02/04/2010. FINDINGS: Brain: Cerebral volume remains normal for age. No midline shift, mass effect, or evidence of intracranial mass lesion. No ventriculomegaly. No acute intracranial hemorrhage identified. Patchy posterior hemisphere subcortical white matter hypodensity has increased since 2011. No cortically based acute infarct identified. No cortical encephalomalacia identified. The deep gray nuclei remain within normal limits. Vascular: No suspicious intracranial vascular hyperdensity. Skull: Negative. Sinuses/Orbits: Visualized paranasal sinuses and mastoids are stable and well pneumatized. Other: Visualized orbits and scalp soft tissues are within normal limits. ASPECTS [facility] Stroke Program Early CT Score) Total score (0-10 with 10 being normal): 10 IMPRESSION: 1. No acute cortically based infarct or acute intracranial hemorrhage identified. ASPECTS 10. 2. Progressed nonspecific cerebral white matter changes since 2011. Electronically Signed: By: Odessa Fleming M.D. On: 04/18/2020 09:35    Assessment: 54 y.o. female with a history of tobacco abuse and HTN (noncompliant with  medications) who presents with right sided hemiparesis that started today after awakening.  Initial NIHSS of 7.  Head CT personally reviewed and shows no acute changes.  Contraindications of tPA reviewed.  Risks and benefits of tPA discussed with patient.  Verbal consent  obtained.  Delay in administration of tPA was experienced due to elevated BP that required extensive intervention to obtain BP in the acceptable range for tPA administration.  Once target BP achieved, tPA administered.      Stroke Risk Factors - hypertension and smoking  Plan: 1. HgbA1c, fasting lipid panel 2. MRI of the brain without contrast 3. PT consult, OT consult, Speech consult 4. Echocardiogram 5. CTA of the head and neck to be performed STAT to rule out acute occlusion amenable to thrombectomy 6. Prophylactic therapy-None 7. NPO until RN stroke swallow screen 8. Telemetry monitoring 9. Frequent neuro checks 10. Repeat brain imaging 24 hours after tPA infusion.   11. BP control as outlined in orders.  Patient currently on IV Cleviprex.    This patient is critically ill and at significant risk of neurological worsening, death and care requires constant monitoring of vital signs, hemodynamics,respiratory and cardiac monitoring, neurological assessment, discussion with family, other specialists and medical decision making of high complexity. I spent 90 minutes of neurocritical care time  in the care of  this patient.  Thana Farr, MD Neurology [phone] 04/18/2020, 10:31 AM

## 2020-04-18 NOTE — ED Notes (Signed)
Per Tobi Bastos, RN triage nurse. Pt comes to the ED via POV for R sided weakness, R sided droop and drift in her R arm and leg. Pt speech is clear and denies pain. LKW [id number].

## 2020-04-18 NOTE — ED Triage Notes (Signed)
See previous blank note

## 2020-04-18 NOTE — ED Notes (Signed)
Dr. Darnelle Catalan, EDP cleared pt for CT scan.

## 2020-04-18 NOTE — ED Notes (Signed)
Spoke with Consulting civil engineer in the ICU. Per Consulting civil engineer, pt in unable to come up at this time due to no available beds. Spoke to ED Consulting civil engineer. States to message admitting doctor to try and transfer pt out.

## 2020-04-18 NOTE — ED Notes (Signed)
Pt resting comfortably at this time. RR even and unlabored. Call bell within reach, stretcher locked in lowest position

## 2020-04-18 NOTE — ED Notes (Signed)
Dr. Thad Ranger, Neurologist in CT scan with pt

## 2020-04-18 NOTE — Progress Notes (Addendum)
Ch arrived at room in response to Code Stroke. Pt was returning from CT. RN let Ch know that Pt's daughter was in the ED waiting area. Ch checked with Pt and went to go get daughter to the room. Daughter Toni Amend was pacing in the waiting area, she had told staff that she was uncomfortable being there, and did not want to come in to see her mother. Toni Amend was waiting for her grand father to come to be with her mother. Ch waited with her, and escorted grand father to the room after he was screened. Ch provided pastoral support to Pt's father and was present in the room with Pt as care team was working on Pt. Ch will follow-up later.

## 2020-04-18 NOTE — Progress Notes (Signed)
Pt alert and oriented, no complaints at this time, resting in bed, and in no acute distress.  Pt states "I will be glad when I can eat." Pts urine blood tinged, however pt hemodynamically stable.  Will continue to monitor and assess pt.   Sonda Rumble, AGNP  Pulmonary/Critical Care Pager [phone] (please enter 7 digits) PCCM Consult Pager [phone] (please enter 7 digits)

## 2020-04-18 NOTE — ED Provider Notes (Signed)
[facility] Emergency Department Provider Note   ____________________________________________   First MD Initiated Contact with Patient 04/18/20 [phone]     (approximate)  I have reviewed the triage vital signs and the nursing notes.   HISTORY  Chief Complaint Code Stroke    HPI BREONA CHERUBIN is a 54 y.o. female who reports she got up this morning went to take a shower around about 8:00 noticed after she lay down for bed that she had right-sided weakness.  This is new.  She has never had thing like this before.  Symptoms came on at about 8:00.  She has numbness and weakness on the right side.         Past Medical History:  Diagnosis Date  . Hypertension     Patient Active Problem List   Diagnosis Date Noted  . CVA (cerebral vascular accident) (HCC) 04/18/2020    Past Surgical History:  Procedure Laterality Date  . ABDOMINAL HYSTERECTOMY    . CHOLECYSTECTOMY      Prior to Admission medications   Medication Sig Start Date End Date Taking? Authorizing Provider  aspirin EC 81 MG tablet Take 81 mg by mouth daily. Swallow whole.   Yes [provider]    Allergies Other and Penicillins  History reviewed. No pertinent family history.  Social History Social History   Tobacco Use  . Smoking status: Current Every Day Smoker  Substance Use Topics  . Alcohol use: Yes  . Drug use: Yes    Types: Marijuana    Review of Systems  Constitutional: No fever/chills Eyes: No visual changes. ENT: No sore throat. Cardiovascular: Denies chest pain. Respiratory: Denies shortness of breath. Gastrointestinal: No abdominal pain.  No nausea, no vomiting.  No diarrhea.  No constipation. Genitourinary: Negative for dysuria. Musculoskeletal: Negative for back pain. Skin: Negative for rash. Neurological: Negative for headaches  ____________________________________________   PHYSICAL EXAM:  VITAL SIGNS: ED Triage Vitals [04/18/20 0928]    Enc Vitals Group     BP      Pulse      Resp      Temp      Temp src      SpO2      Weight 169 lb 8.5 oz (76.9 kg)     Height      Head Circumference      Peak Flow      Pain Score      Pain Loc      Pain Edu?      Excl. in GC?     Constitutional: Alert and oriented.  Right-sided weakness Eyes: Conjunctivae are normal. PER. EOMI. Head: Atraumatic. Nose: No congestion/rhinnorhea. Mouth/Throat: Mucous membranes are moist.  Oropharynx non-erythematous. Neck: No stridor.   Cardiovascular: Normal rate, regular rhythm. Grossly normal heart sounds.  Good peripheral circulation. Respiratory: Normal respiratory effort.  No retractions. Lungs CTAB. Gastrointestinal: Soft and nontender. No distention. No abdominal bruits.  Musculoskeletal: No lower extremity tenderness nor edema.  Neurologic:  Normal speech and language.  Right-sided numbness and weakness patient can move her extremities against gravity but not much. Skin:  Skin is warm, dry and intact. No rash noted.   ____________________________________________   LABS (all labs ordered are listed, but only abnormal results are displayed)  Labs Reviewed  COMPREHENSIVE METABOLIC PANEL - Abnormal; Notable for the following components:      Result Value   Potassium 3.3 (*)    Glucose, Bld 109 (*)    All other  components within normal limits  URINE DRUG SCREEN, QUALITATIVE ([facility] ONLY) - Abnormal; Notable for the following components:   Cannabinoid 50 Ng, Ur [place] POSITIVE (*)    All other components within normal limits  SARS CORONAVIRUS 2 BY RT PCR (HOSPITAL ORDER, PERFORMED IN [facility])  CBC  DIFFERENTIAL  GLUCOSE, CAPILLARY  PROTIME-INR  APTT  HIV ANTIBODY (ROUTINE TESTING W REFLEX)  CBG MONITORING, ED  TROPONIN I (HIGH SENSITIVITY)  TROPONIN I (HIGH SENSITIVITY)   ____________________________________________  EKG  EKG read interpreted by me shows normal sinus rhythm rate of 87 normal axis patient has  flipped T waves inferiorly and in V5 and 6 with about a millimeter ST segment depression in V5 and 6 this is new from last EKG done last year. ____________________________________________  RADIOLOGY  ED MD interpretation: CT read by radiology shows no acute findings there is increasing patchy white matter hypodensities.  Official radiology report(s): CT HEAD CODE STROKE WO CONTRAST  Addendum Date: 04/18/2020   ADDENDUM REPORT: 04/18/2020 09:43 ADDENDUM: Study discussed by telephone with Dr. Dorothea Glassman on 04/18/2020 at 0941 hours. Electronically Signed   By: Odessa Fleming M.D.   On: 04/18/2020 09:43   Result Date: 04/18/2020 CLINICAL DATA:  Code stroke. 54 year old female with right side weakness last known well 0830 hours. EXAM: CT HEAD WITHOUT CONTRAST TECHNIQUE: Contiguous axial images were obtained from the base of the skull through the vertex without intravenous contrast. COMPARISON:  Head CT 02/04/2010. FINDINGS: Brain: Cerebral volume remains normal for age. No midline shift, mass effect, or evidence of intracranial mass lesion. No ventriculomegaly. No acute intracranial hemorrhage identified. Patchy posterior hemisphere subcortical white matter hypodensity has increased since 2011. No cortically based acute infarct identified. No cortical encephalomalacia identified. The deep gray nuclei remain within normal limits. Vascular: No suspicious intracranial vascular hyperdensity. Skull: Negative. Sinuses/Orbits: Visualized paranasal sinuses and mastoids are stable and well pneumatized. Other: Visualized orbits and scalp soft tissues are within normal limits. ASPECTS [facility] Stroke Program Early CT Score) Total score (0-10 with 10 being normal): 10 IMPRESSION: 1. No acute cortically based infarct or acute intracranial hemorrhage identified. ASPECTS 10. 2. Progressed nonspecific cerebral white matter changes since 2011. Electronically Signed: By: Odessa Fleming M.D. On: 04/18/2020 09:35   CT ANGIO HEAD CODE  STROKE  Result Date: 04/18/2020 CLINICAL DATA:  54 year old female code stroke presentation with right side symptoms last known well 0830 hours. EXAM: CT ANGIOGRAPHY HEAD AND NECK TECHNIQUE: Multidetector CT imaging of the head and neck was performed using the standard protocol during bolus administration of intravenous contrast. Multiplanar CT image reconstructions and MIPs were obtained to evaluate the vascular anatomy. Carotid stenosis measurements (when applicable) are obtained utilizing NASCET criteria, using the distal internal carotid diameter as the denominator. CONTRAST:  6mL OMNIPAQUE IOHEXOL 350 MG/ML SOLN COMPARISON:  Plain head CT 0924 hours today. FINDINGS: CTA NECK Skeleton: Negative. Upper chest: Negative. Other neck: Mild thyroid enlargement but no discrete thyroid nodule. Otherwise negative CT appearance of the neck soft tissues. Aortic arch: 3 vessel arch configuration with minimal arch atherosclerosis. Right carotid system: Tortuous brachiocephalic artery and proximal right CCA with no plaque or stenosis. Negative right carotid bifurcation. Tortuous right ICA distal to the bulb with a kinked appearance at the C2-C3 level (series 12, image 23), but no plaque or stenosis to the skull base. Left carotid system: Tortuous proximal left CCA. Mildly tortuous left ICA. No plaque or stenosis. Vertebral arteries: Tortuous proximal right subclavian artery with no plaque  or stenosis. Normal right vertebral artery origin. Tortuous right V2 segment, but otherwise normal right vertebral to the skull base. Mildly tortuous proximal left subclavian artery with a normal left vertebral artery origin. Tortuous left V1 and V2 segments. The left vertebral appears mildly dominant and otherwise normal to the skull base. CTA HEAD Posterior circulation: Normal distal vertebral arteries and PICA origins. Normal vertebrobasilar junction. Patent basilar artery. Patent AICA, SCA and left PCA origins. Fetal type right PCA  origin. Left posterior communicating artery diminutive or absent. Bilateral PCA branches are within normal limits, the right posterior communicating artery is mildly tortuous and irregular (series 13, image 18) without stenosis. Anterior circulation: Both ICA siphons are patent. No siphon plaque or stenosis. Normal ophthalmic and left posterior communicating artery origins. Patent carotid termini, MCA and ACA origins. Tortuous ACA A1 segments. Anterior communicating artery and bilateral ACA branches are within normal limits. Left MCA M1 segment is tortuous. Left MCA bifurcates without stenosis. Left MCA branches are within normal limits. Right MCA M1 segment is mildly tortuous. Right MCA trifurcation is patent without stenosis. Right MCA branches are within normal limits. Venous sinuses: Patent. Anatomic variants: Mildly dominant left vertebral artery. Fetal right PCA origin. Review of the MIP images confirms the above findings IMPRESSION: Negative for large vessel occlusion. Generalized arterial tortuosity in the head and neck, but no atherosclerosis or stenosis. Electronically Signed   By: Odessa Fleming M.D.   On: 04/18/2020 10:47   CT ANGIO NECK CODE STROKE  Result Date: 04/18/2020 CLINICAL DATA:  54 year old female code stroke presentation with right side symptoms last known well 0830 hours. EXAM: CT ANGIOGRAPHY HEAD AND NECK TECHNIQUE: Multidetector CT imaging of the head and neck was performed using the standard protocol during bolus administration of intravenous contrast. Multiplanar CT image reconstructions and MIPs were obtained to evaluate the vascular anatomy. Carotid stenosis measurements (when applicable) are obtained utilizing NASCET criteria, using the distal internal carotid diameter as the denominator. CONTRAST:  82mL OMNIPAQUE IOHEXOL 350 MG/ML SOLN COMPARISON:  Plain head CT 0924 hours today. FINDINGS: CTA NECK Skeleton: Negative. Upper chest: Negative. Other neck: Mild thyroid enlargement but no  discrete thyroid nodule. Otherwise negative CT appearance of the neck soft tissues. Aortic arch: 3 vessel arch configuration with minimal arch atherosclerosis. Right carotid system: Tortuous brachiocephalic artery and proximal right CCA with no plaque or stenosis. Negative right carotid bifurcation. Tortuous right ICA distal to the bulb with a kinked appearance at the C2-C3 level (series 12, image 23), but no plaque or stenosis to the skull base. Left carotid system: Tortuous proximal left CCA. Mildly tortuous left ICA. No plaque or stenosis. Vertebral arteries: Tortuous proximal right subclavian artery with no plaque or stenosis. Normal right vertebral artery origin. Tortuous right V2 segment, but otherwise normal right vertebral to the skull base. Mildly tortuous proximal left subclavian artery with a normal left vertebral artery origin. Tortuous left V1 and V2 segments. The left vertebral appears mildly dominant and otherwise normal to the skull base. CTA HEAD Posterior circulation: Normal distal vertebral arteries and PICA origins. Normal vertebrobasilar junction. Patent basilar artery. Patent AICA, SCA and left PCA origins. Fetal type right PCA origin. Left posterior communicating artery diminutive or absent. Bilateral PCA branches are within normal limits, the right posterior communicating artery is mildly tortuous and irregular (series 13, image 18) without stenosis. Anterior circulation: Both ICA siphons are patent. No siphon plaque or stenosis. Normal ophthalmic and left posterior communicating artery origins. Patent carotid termini, MCA and ACA origins.  Tortuous ACA A1 segments. Anterior communicating artery and bilateral ACA branches are within normal limits. Left MCA M1 segment is tortuous. Left MCA bifurcates without stenosis. Left MCA branches are within normal limits. Right MCA M1 segment is mildly tortuous. Right MCA trifurcation is patent without stenosis. Right MCA branches are within normal  limits. Venous sinuses: Patent. Anatomic variants: Mildly dominant left vertebral artery. Fetal right PCA origin. Review of the MIP images confirms the above findings IMPRESSION: Negative for large vessel occlusion. Generalized arterial tortuosity in the head and neck, but no atherosclerosis or stenosis. Electronically Signed   By: Odessa FlemingH  Hall M.D.   On: 04/18/2020 10:47    ____________________________________________   PROCEDURES  Procedure(s) performed (including Critical Care): Critical care time 20 minutes.  This includes examining the patient reviewing her records talking to Dr. Thad Rangereynolds and cardiologist and the intensive care doctor  Procedures   ____________________________________________   INITIAL IMPRESSION / ASSESSMENT AND PLAN / ED COURSE Dr. Thad Rangereynolds, neurology, arrived immediately after CT began treating the patient.  She started Cleviprex for her hypertension.  Plan is to do TPA and get a angiogram.  I am contacting cardiology to get their opinion on the ST-T changes.  We will add a troponin.    ----------------------------------------- 10:07 AM on 04/18/2020 -----------------------------------------  Discussed EKG with Dr. Graciela HusbandsKlein cardiology.  He does not believe that there is any reason not to give TPA.  This EKG could be due to a recent infarct possibly which could have then led to her throwing a clot.  We will continue to investigate the causes of her stroke.   Also discussed patient with the intensivist.  He will admit the patient to the ICU.  Patient's blood pressure is now better we will keep her off the Cleviprex drip unless that changes.       ____________________________________________   FINAL CLINICAL IMPRESSION(S) / ED DIAGNOSES  Final diagnoses:  Acute ischemic stroke [facility](HCC)     ED Discharge Orders    None       Note:  This document was prepared using Dragon voice recognition software and may include unintentional dictation errors.    Arnaldo NatalMalinda,  Brandis Wixted F, MD 04/18/20 [phone]

## 2020-04-18 NOTE — H&P (Signed)
CRITICAL CARE PROGRESS NOTE    Name: Lindsay Guzman MRN: [id number] DOB: 1966/01/18     LOS: 0   SUBJECTIVE FINDINGS & SIGNIFICANT EVENTS    Patient description:  54 yo F w/hx of HTN, active lifelong smoker, came in for facial drooping and upper extermity parasthesias experienced AM day of admission. Patient shares she was in her usual state of health until this am, was unable to move correctly out of bed asked for daughter to help who was in her home at that time. EMS called and patient BIBEMS with s/s of acute CVA. Code stroke activated with tPA protocol.  ICU admission for post tPA care and serial neuro evaluation.   Lines/tubes : PIVx2  Microbiology/Sepsis markers: No results found for this or any previous visit.  Anti-infectives:  Anti-infectives (From admission, onward)   None       Consults: Treatment Team:  Kym Groom, MD     Tests / Events: 04/18/20- S/p TPA    PAST MEDICAL HISTORY   Past Medical History:  Diagnosis Date  . Hypertension      SURGICAL HISTORY   Past Surgical History:  Procedure Laterality Date  . ABDOMINAL HYSTERECTOMY    . CHOLECYSTECTOMY       FAMILY HISTORY   History reviewed. No pertinent family history.   SOCIAL HISTORY   Social History   Tobacco Use  . Smoking status: Current Every Day Smoker  Substance Use Topics  . Alcohol use: Yes  . Drug use: Yes    Types: Marijuana     MEDICATIONS   Current Medication:  Current Facility-Administered Medications:  .  clevidipine (CLEVIPREX) infusion 0.5 mg/mL, 1 mg/hr, Intravenous, Continuous, Thana Farr, MD, Last Rate: 14 mL/hr at 04/18/20 1011, 7 mg/hr at 04/18/20 1011 .  sodium chloride flush (NS) 0.9 % injection 3 mL, 3 mL, Intravenous, Once, Arnaldo Natal, MD  Current  Outpatient Medications:  .  aspirin EC 81 MG tablet, Take 81 mg by mouth daily. Swallow whole., Disp: , Rfl:     ALLERGIES   Other and Penicillins    REVIEW OF SYSTEMS     10 point ROS done and is negative except thirst  PHYSICAL EXAMINATION   Vital Signs: Temp:  [98.1 F (36.7 C)] 98.1 F (36.7 C) (07/05 0950) Pulse Rate:  [78-161] 86 (07/05 1151) Resp:  [11-27] 16 (07/05 1151) BP: (146-214)/(89-141) 152/100 (07/05 1151) SpO2:  [57 %-100 %] 97 % (07/05 1151) Weight:  [76.9 kg] 76.9 kg (07/05 0928)  GENERAL:NAD HEAD: Normocephalic, atraumatic.  EYES: Pupils equal, round, reactive to light.  No scleral icterus.  MOUTH: Moist mucosal membrane. NECK: Supple. No thyromegaly. No nodules. No JVD.  PULMONARY: CTAB CARDIOVASCULAR: S1 and S2. Regular rate and rhythm. No murmurs, rubs, or gallops.  GASTROINTESTINAL: Soft, nontender, non-distended. No masses. Positive bowel sounds. No hepatosplenomegaly.  MUSCULOSKELETAL: No swelling, clubbing, or edema.  NEUROLOGIC: Mild distress due to acute illness SKIN:intact,warm,dry   PERTINENT DATA     Infusions: . clevidipine 7 mg/hr (04/18/20 1011)   Scheduled Medications: . sodium chloride flush  3 mL Intravenous Once   PRN Medications:  Hemodynamic parameters:   Intake/Output: No intake/output data recorded.  Ventilator  Settings:      LAB RESULTS:  Basic Metabolic Panel: Recent Labs  Lab 04/18/20 0913  NA 138  K 3.3*  CL 102  CO2 26  GLUCOSE 109*  BUN 13  CREATININE 0.87  CALCIUM 8.9   Liver Function Tests: Recent Labs  Lab 04/18/20 0913  AST 19  ALT 12  ALKPHOS 72  BILITOT 0.8  PROT 7.9  ALBUMIN 4.2   No results for input(s): LIPASE, AMYLASE in the last 168 hours. No results for input(s): AMMONIA in the last 168 hours. CBC: Recent Labs  Lab 04/18/20 0913  WBC 8.3  NEUTROABS 3.8  HGB 14.4  HCT 41.9  MCV 82.0  PLT 248   Cardiac Enzymes: No results for input(s): CKTOTAL, CKMB,  CKMBINDEX, TROPONINI in the last 168 hours. BNP: Invalid input(s): POCBNP CBG: Recent Labs  Lab 04/18/20 0916  GLUCAP 99       IMAGING RESULTS:  Imaging: CT HEAD CODE STROKE WO CONTRAST  Addendum Date: 04/18/2020   ADDENDUM REPORT: 04/18/2020 09:43 ADDENDUM: Study discussed by telephone with Dr. Dorothea GlassmanPAUL MALINDA on 04/18/2020 at 0941 hours. Electronically Signed   By: Odessa FlemingH  Hall M.D.   On: 04/18/2020 09:43   Result Date: 04/18/2020 CLINICAL DATA:  Code stroke. 54 year old female with right side weakness last known well 0830 hours. EXAM: CT HEAD WITHOUT CONTRAST TECHNIQUE: Contiguous axial images were obtained from the base of the skull through the vertex without intravenous contrast. COMPARISON:  Head CT 02/04/2010. FINDINGS: Brain: Cerebral volume remains normal for age. No midline shift, mass effect, or evidence of intracranial mass lesion. No ventriculomegaly. No acute intracranial hemorrhage identified. Patchy posterior hemisphere subcortical white matter hypodensity has increased since 2011. No cortically based acute infarct identified. No cortical encephalomalacia identified. The deep gray nuclei remain within normal limits. Vascular: No suspicious intracranial vascular hyperdensity. Skull: Negative. Sinuses/Orbits: Visualized paranasal sinuses and mastoids are stable and well pneumatized. Other: Visualized orbits and scalp soft tissues are within normal limits. ASPECTS [facility]([place] Stroke Program Early CT Score) Total score (0-10 with 10 being normal): 10 IMPRESSION: 1. No acute cortically based infarct or acute intracranial hemorrhage identified. ASPECTS 10. 2. Progressed nonspecific cerebral white matter changes since 2011. Electronically Signed: By: Odessa FlemingH  Hall M.D. On: 04/18/2020 09:35   CT ANGIO HEAD CODE STROKE  Result Date: 04/18/2020 CLINICAL DATA:  54 year old female code stroke presentation with right side symptoms last known well 0830 hours. EXAM: CT ANGIOGRAPHY HEAD AND NECK TECHNIQUE:  Multidetector CT imaging of the head and neck was performed using the standard protocol during bolus administration of intravenous contrast. Multiplanar CT image reconstructions and MIPs were obtained to evaluate the vascular anatomy. Carotid stenosis measurements (when applicable) are obtained utilizing NASCET criteria, using the distal internal carotid diameter as the denominator. CONTRAST:  75mL OMNIPAQUE IOHEXOL 350 MG/ML SOLN COMPARISON:  Plain head CT 0924 hours today. FINDINGS: CTA NECK Skeleton: Negative. Upper chest: Negative. Other neck: Mild thyroid enlargement but no discrete thyroid nodule. Otherwise negative CT appearance of the neck soft tissues. Aortic arch: 3 vessel arch configuration with minimal arch atherosclerosis. Right carotid system: Tortuous brachiocephalic artery and proximal right CCA with no plaque or stenosis. Negative right carotid bifurcation. Tortuous right ICA distal to the bulb with a kinked appearance at the C2-C3 level (series 12, image 23), but no plaque or stenosis to the skull base. Left carotid system: Tortuous proximal left CCA. Mildly tortuous left ICA. No plaque or stenosis. Vertebral arteries: Tortuous proximal right subclavian artery with no plaque or stenosis. Normal right vertebral artery origin. Tortuous right V2 segment, but otherwise normal right vertebral to the skull base. Mildly tortuous proximal left subclavian artery with a normal left vertebral artery origin. Tortuous left V1 and V2 segments. The left vertebral appears mildly dominant and otherwise normal to the  skull base. CTA HEAD Posterior circulation: Normal distal vertebral arteries and PICA origins. Normal vertebrobasilar junction. Patent basilar artery. Patent AICA, SCA and left PCA origins. Fetal type right PCA origin. Left posterior communicating artery diminutive or absent. Bilateral PCA branches are within normal limits, the right posterior communicating artery is mildly tortuous and irregular  (series 13, image 18) without stenosis. Anterior circulation: Both ICA siphons are patent. No siphon plaque or stenosis. Normal ophthalmic and left posterior communicating artery origins. Patent carotid termini, MCA and ACA origins. Tortuous ACA A1 segments. Anterior communicating artery and bilateral ACA branches are within normal limits. Left MCA M1 segment is tortuous. Left MCA bifurcates without stenosis. Left MCA branches are within normal limits. Right MCA M1 segment is mildly tortuous. Right MCA trifurcation is patent without stenosis. Right MCA branches are within normal limits. Venous sinuses: Patent. Anatomic variants: Mildly dominant left vertebral artery. Fetal right PCA origin. Review of the MIP images confirms the above findings IMPRESSION: Negative for large vessel occlusion. Generalized arterial tortuosity in the head and neck, but no atherosclerosis or stenosis. Electronically Signed   By: Odessa Fleming M.D.   On: 04/18/2020 10:47   CT ANGIO NECK CODE STROKE  Result Date: 04/18/2020 CLINICAL DATA:  54 year old female code stroke presentation with right side symptoms last known well 0830 hours. EXAM: CT ANGIOGRAPHY HEAD AND NECK TECHNIQUE: Multidetector CT imaging of the head and neck was performed using the standard protocol during bolus administration of intravenous contrast. Multiplanar CT image reconstructions and MIPs were obtained to evaluate the vascular anatomy. Carotid stenosis measurements (when applicable) are obtained utilizing NASCET criteria, using the distal internal carotid diameter as the denominator. CONTRAST:  75mL OMNIPAQUE IOHEXOL 350 MG/ML SOLN COMPARISON:  Plain head CT 0924 hours today. FINDINGS: CTA NECK Skeleton: Negative. Upper chest: Negative. Other neck: Mild thyroid enlargement but no discrete thyroid nodule. Otherwise negative CT appearance of the neck soft tissues. Aortic arch: 3 vessel arch configuration with minimal arch atherosclerosis. Right carotid system: Tortuous  brachiocephalic artery and proximal right CCA with no plaque or stenosis. Negative right carotid bifurcation. Tortuous right ICA distal to the bulb with a kinked appearance at the C2-C3 level (series 12, image 23), but no plaque or stenosis to the skull base. Left carotid system: Tortuous proximal left CCA. Mildly tortuous left ICA. No plaque or stenosis. Vertebral arteries: Tortuous proximal right subclavian artery with no plaque or stenosis. Normal right vertebral artery origin. Tortuous right V2 segment, but otherwise normal right vertebral to the skull base. Mildly tortuous proximal left subclavian artery with a normal left vertebral artery origin. Tortuous left V1 and V2 segments. The left vertebral appears mildly dominant and otherwise normal to the skull base. CTA HEAD Posterior circulation: Normal distal vertebral arteries and PICA origins. Normal vertebrobasilar junction. Patent basilar artery. Patent AICA, SCA and left PCA origins. Fetal type right PCA origin. Left posterior communicating artery diminutive or absent. Bilateral PCA branches are within normal limits, the right posterior communicating artery is mildly tortuous and irregular (series 13, image 18) without stenosis. Anterior circulation: Both ICA siphons are patent. No siphon plaque or stenosis. Normal ophthalmic and left posterior communicating artery origins. Patent carotid termini, MCA and ACA origins. Tortuous ACA A1 segments. Anterior communicating artery and bilateral ACA branches are within normal limits. Left MCA M1 segment is tortuous. Left MCA bifurcates without stenosis. Left MCA branches are within normal limits. Right MCA M1 segment is mildly tortuous. Right MCA trifurcation is patent without stenosis. Right MCA branches  are within normal limits. Venous sinuses: Patent. Anatomic variants: Mildly dominant left vertebral artery. Fetal right PCA origin. Review of the MIP images confirms the above findings IMPRESSION: Negative for large  vessel occlusion. Generalized arterial tortuosity in the head and neck, but no atherosclerosis or stenosis. Electronically Signed   By: Odessa Fleming M.D.   On: 04/18/2020 10:47   @PROBHOSP @ CT HEAD CODE STROKE WO CONTRAST  Addendum Date: 04/18/2020   ADDENDUM REPORT: 04/18/2020 09:43 ADDENDUM: Study discussed by telephone with Dr. 06/19/2020 on 04/18/2020 at 0941 hours. Electronically Signed   By: 06/19/2020 M.D.   On: 04/18/2020 09:43   Result Date: 04/18/2020 CLINICAL DATA:  Code stroke. 54 year old female with right side weakness last known well 0830 hours. EXAM: CT HEAD WITHOUT CONTRAST TECHNIQUE: Contiguous axial images were obtained from the base of the skull through the vertex without intravenous contrast. COMPARISON:  Head CT 02/04/2010. FINDINGS: Brain: Cerebral volume remains normal for age. No midline shift, mass effect, or evidence of intracranial mass lesion. No ventriculomegaly. No acute intracranial hemorrhage identified. Patchy posterior hemisphere subcortical white matter hypodensity has increased since 2011. No cortically based acute infarct identified. No cortical encephalomalacia identified. The deep gray nuclei remain within normal limits. Vascular: No suspicious intracranial vascular hyperdensity. Skull: Negative. Sinuses/Orbits: Visualized paranasal sinuses and mastoids are stable and well pneumatized. Other: Visualized orbits and scalp soft tissues are within normal limits. ASPECTS [facility] Stroke Program Early CT Score) Total score (0-10 with 10 being normal): 10 IMPRESSION: 1. No acute cortically based infarct or acute intracranial hemorrhage identified. ASPECTS 10. 2. Progressed nonspecific cerebral white matter changes since 2011. Electronically Signed: By: 2012 M.D. On: 04/18/2020 09:35   CT ANGIO HEAD CODE STROKE  Result Date: 04/18/2020 CLINICAL DATA:  54 year old female code stroke presentation with right side symptoms last known well 0830 hours. EXAM: CT ANGIOGRAPHY HEAD AND NECK  TECHNIQUE: Multidetector CT imaging of the head and neck was performed using the standard protocol during bolus administration of intravenous contrast. Multiplanar CT image reconstructions and MIPs were obtained to evaluate the vascular anatomy. Carotid stenosis measurements (when applicable) are obtained utilizing NASCET criteria, using the distal internal carotid diameter as the denominator. CONTRAST:  33mL OMNIPAQUE IOHEXOL 350 MG/ML SOLN COMPARISON:  Plain head CT 0924 hours today. FINDINGS: CTA NECK Skeleton: Negative. Upper chest: Negative. Other neck: Mild thyroid enlargement but no discrete thyroid nodule. Otherwise negative CT appearance of the neck soft tissues. Aortic arch: 3 vessel arch configuration with minimal arch atherosclerosis. Right carotid system: Tortuous brachiocephalic artery and proximal right CCA with no plaque or stenosis. Negative right carotid bifurcation. Tortuous right ICA distal to the bulb with a kinked appearance at the C2-C3 level (series 12, image 23), but no plaque or stenosis to the skull base. Left carotid system: Tortuous proximal left CCA. Mildly tortuous left ICA. No plaque or stenosis. Vertebral arteries: Tortuous proximal right subclavian artery with no plaque or stenosis. Normal right vertebral artery origin. Tortuous right V2 segment, but otherwise normal right vertebral to the skull base. Mildly tortuous proximal left subclavian artery with a normal left vertebral artery origin. Tortuous left V1 and V2 segments. The left vertebral appears mildly dominant and otherwise normal to the skull base. CTA HEAD Posterior circulation: Normal distal vertebral arteries and PICA origins. Normal vertebrobasilar junction. Patent basilar artery. Patent AICA, SCA and left PCA origins. Fetal type right PCA origin. Left posterior communicating artery diminutive or absent. Bilateral PCA branches are within normal limits, the right  posterior communicating artery is mildly tortuous and  irregular (series 13, image 18) without stenosis. Anterior circulation: Both ICA siphons are patent. No siphon plaque or stenosis. Normal ophthalmic and left posterior communicating artery origins. Patent carotid termini, MCA and ACA origins. Tortuous ACA A1 segments. Anterior communicating artery and bilateral ACA branches are within normal limits. Left MCA M1 segment is tortuous. Left MCA bifurcates without stenosis. Left MCA branches are within normal limits. Right MCA M1 segment is mildly tortuous. Right MCA trifurcation is patent without stenosis. Right MCA branches are within normal limits. Venous sinuses: Patent. Anatomic variants: Mildly dominant left vertebral artery. Fetal right PCA origin. Review of the MIP images confirms the above findings IMPRESSION: Negative for large vessel occlusion. Generalized arterial tortuosity in the head and neck, but no atherosclerosis or stenosis. Electronically Signed   By: Odessa Fleming M.D.   On: 04/18/2020 10:47   CT ANGIO NECK CODE STROKE  Result Date: 04/18/2020 CLINICAL DATA:  54 year old female code stroke presentation with right side symptoms last known well 0830 hours. EXAM: CT ANGIOGRAPHY HEAD AND NECK TECHNIQUE: Multidetector CT imaging of the head and neck was performed using the standard protocol during bolus administration of intravenous contrast. Multiplanar CT image reconstructions and MIPs were obtained to evaluate the vascular anatomy. Carotid stenosis measurements (when applicable) are obtained utilizing NASCET criteria, using the distal internal carotid diameter as the denominator. CONTRAST:  5mL OMNIPAQUE IOHEXOL 350 MG/ML SOLN COMPARISON:  Plain head CT 0924 hours today. FINDINGS: CTA NECK Skeleton: Negative. Upper chest: Negative. Other neck: Mild thyroid enlargement but no discrete thyroid nodule. Otherwise negative CT appearance of the neck soft tissues. Aortic arch: 3 vessel arch configuration with minimal arch atherosclerosis. Right carotid system:  Tortuous brachiocephalic artery and proximal right CCA with no plaque or stenosis. Negative right carotid bifurcation. Tortuous right ICA distal to the bulb with a kinked appearance at the C2-C3 level (series 12, image 23), but no plaque or stenosis to the skull base. Left carotid system: Tortuous proximal left CCA. Mildly tortuous left ICA. No plaque or stenosis. Vertebral arteries: Tortuous proximal right subclavian artery with no plaque or stenosis. Normal right vertebral artery origin. Tortuous right V2 segment, but otherwise normal right vertebral to the skull base. Mildly tortuous proximal left subclavian artery with a normal left vertebral artery origin. Tortuous left V1 and V2 segments. The left vertebral appears mildly dominant and otherwise normal to the skull base. CTA HEAD Posterior circulation: Normal distal vertebral arteries and PICA origins. Normal vertebrobasilar junction. Patent basilar artery. Patent AICA, SCA and left PCA origins. Fetal type right PCA origin. Left posterior communicating artery diminutive or absent. Bilateral PCA branches are within normal limits, the right posterior communicating artery is mildly tortuous and irregular (series 13, image 18) without stenosis. Anterior circulation: Both ICA siphons are patent. No siphon plaque or stenosis. Normal ophthalmic and left posterior communicating artery origins. Patent carotid termini, MCA and ACA origins. Tortuous ACA A1 segments. Anterior communicating artery and bilateral ACA branches are within normal limits. Left MCA M1 segment is tortuous. Left MCA bifurcates without stenosis. Left MCA branches are within normal limits. Right MCA M1 segment is mildly tortuous. Right MCA trifurcation is patent without stenosis. Right MCA branches are within normal limits. Venous sinuses: Patent. Anatomic variants: Mildly dominant left vertebral artery. Fetal right PCA origin. Review of the MIP images confirms the above findings IMPRESSION: Negative  for large vessel occlusion. Generalized arterial tortuosity in the head and neck, but no atherosclerosis or stenosis.  Electronically Signed   By: Odessa Fleming M.D.   On: 04/18/2020 10:47     ASSESSMENT AND PLAN    -Multidisciplinary rounds held today  Acute CVA  - s/p tPA  - neurology on case - appreciate input - PT/OT/speech, NPO, repeat brain imaging  -neuro checks per post tPA protocol for 24h  -CT head reviewed -statin 40Lipitor qhs   Tobacco/thc smoking  -Counseling provided for smoking cessation  ICU monitoring -patient declines nicotine replacement    Accelerated hypertension    - permissive hypertension post CVA    - goal 160-180sbp -follow chem 7 -follow UO -continue Foley Catheter-assess need daily   Hypokalemia  - replete Mg and K   ID -continue IV abx as prescibed -follow up cultures  GI/Nutrition GI PROPHYLAXIS as indicated DIET-->TF's as tolerated Constipation protocol as indicated  ENDO - ICU hypoglycemic\Hyperglycemia protocol -check FSBS per protocol   ELECTROLYTES -follow labs as needed -replace as needed -pharmacy consultation   DVT/GI PRX ordered -SCDs  TRANSFUSIONS AS NEEDED MONITOR FSBS ASSESS the need for LABS as needed   Critical care provider statement:    Critical care time (minutes):  34   Critical care time was exclusive of:  Separately billable procedures and treating other patients   Critical care was necessary to treat or prevent imminent or life-threatening deterioration of the following conditions:  Acute CVA, accelearted htn , electrolyte derrangement   Critical care was time spent personally by me on the following activities:  Development of treatment plan with patient or surrogate, discussions with consultants, evaluation of patient's response to treatment, examination of patient, obtaining history from patient or surrogate, ordering and performing treatments and interventions, ordering and review of laboratory studies  and re-evaluation of patient's condition.  I assumed direction of critical care for this patient from another provider in my specialty: no    This document was prepared using Dragon voice recognition software and may include unintentional dictation errors.    Vida Rigger, M.D.  Division of Pulmonary & Critical Care Medicine  [facility]

## 2020-04-18 NOTE — ED Notes (Addendum)
Pt on purewick at this time, pt noted to have output of urine that is red in color. Dr. Karna Christmas messaged via secure chat to notify. Per Dr Karna Christmas, monitor vitals and neuro checks .

## 2020-04-19 ENCOUNTER — Encounter: Payer: Self-pay | Admitting: Pulmonary Disease

## 2020-04-19 ENCOUNTER — Inpatient Hospital Stay: Payer: Self-pay

## 2020-04-19 ENCOUNTER — Inpatient Hospital Stay
Admit: 2020-04-19 | Discharge: 2020-04-19 | Disposition: A | Discharge status: Home / Self Care | Payer: Self-pay | Attending: Neurology | Admitting: Neurology

## 2020-04-19 DIAGNOSIS — I639 Cerebral infarction, unspecified: Principal | ICD-10-CM

## 2020-04-19 LAB — HEMOGLOBIN A1C
Hgb A1c MFr Bld: 5.6 % (ref 4.8–5.6)
Mean Plasma Glucose: 114 mg/dL

## 2020-04-19 LAB — BASIC METABOLIC PANEL
Anion gap: 13 (ref 5–15)
BUN: 16 mg/dL (ref 6–20)
CO2: 24 mmol/L (ref 22–32)
Calcium: 8.8 mg/dL — ABNORMAL LOW (ref 8.9–10.3)
Chloride: 100 mmol/L (ref 98–111)
Creatinine, Ser: 0.91 mg/dL (ref 0.44–1.00)
GFR calc Af Amer: >60 mL/min (ref >60)
GFR calc non Af Amer: >60 mL/min (ref >60)
Glucose, Bld: 122 mg/dL — ABNORMAL HIGH (ref 70–99)
Potassium: 3.1 mmol/L — ABNORMAL LOW (ref 3.5–5.1)
Sodium: 137 mmol/L (ref 135–145)

## 2020-04-19 LAB — CBC
HCT: 41.8 % (ref 36.0–46.0)
Hemoglobin: 14.1 g/dL (ref 12.0–15.0)
MCH: 28.4 pg (ref 26.0–34.0)
MCHC: 33.7 g/dL (ref 30.0–36.0)
MCV: 84.1 fL (ref 80.0–100.0)
Platelets: 246 10*3/uL (ref 150–400)
RBC: 4.97 MIL/uL (ref 3.87–5.11)
RDW: 13.6 % (ref 11.5–15.5)
WBC: 8.9 10*3/uL (ref 4.0–10.5)
nRBC: 0 % (ref 0.0–0.2)

## 2020-04-19 LAB — GLUCOSE, CAPILLARY: Glucose-Capillary: 78 mg/dL (ref 70–99)

## 2020-04-19 LAB — MAGNESIUM: Magnesium: 2.1 mg/dL (ref 1.7–2.4)

## 2020-04-19 LAB — HIV ANTIBODY (ROUTINE TESTING W REFLEX): HIV Screen 4th Generation wRfx: NONREACTIVE

## 2020-04-19 LAB — MRSA PCR SCREENING: MRSA by PCR: NEGATIVE

## 2020-04-19 IMAGING — MR MR HEAD W/O CM
12 series · 46 of 48 positions shown · non-contrast
Comparison: Head CT and CTA from yesterday

CLINICAL DATA: Stroke follow-up after tPA

EXAM:
MRI HEAD WITHOUT CONTRAST
TECHNIQUE: Multiplanar, multiecho pulse sequences of the brain and surrounding
structures were obtained without intravenous contrast.

[Series 5: ax dwi_tracew · axial · 3.0mm · 0.60mm/px · z∈[-38,+117]mm · 4 of 48 slices shown]
[im 1/48]
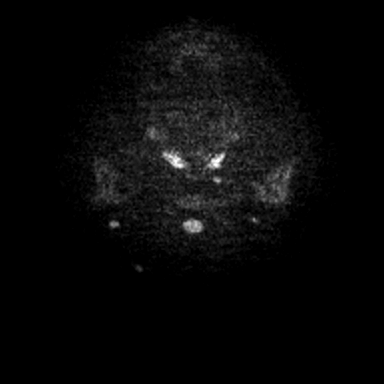
[im 16/48]
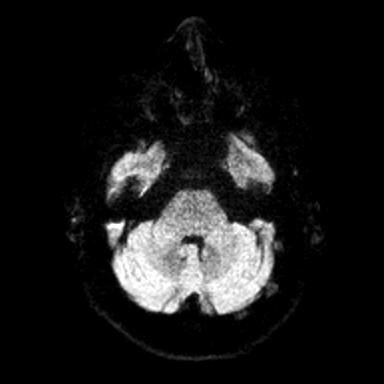
[im 32/48]
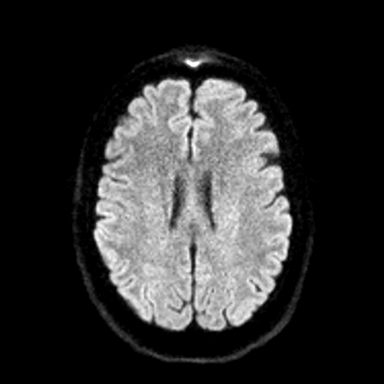
[im 48/48]
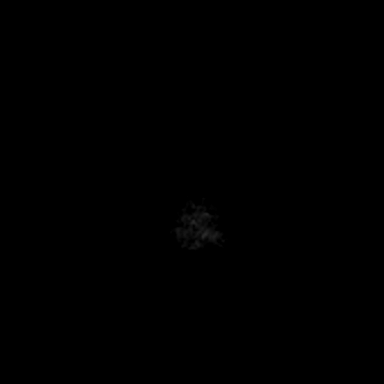

[Series 6: ax dwi_adc · axial · 3.0mm · 0.60mm/px · z∈[-38,+113]mm · 3 of 47 slices shown]
[im 1/47]
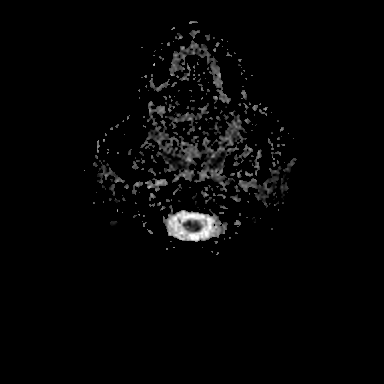
[im 24/47]
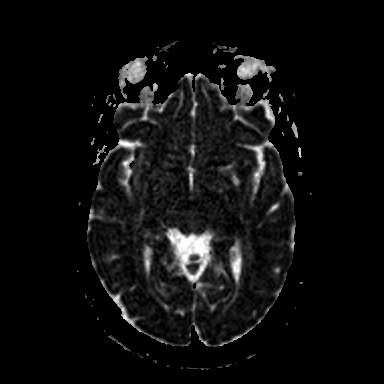
[im 47/47]
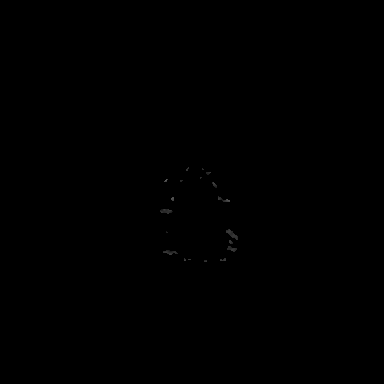

[Series 7: cor dwi_tracew · coronal · 5.0mm · 0.60mm/px · 3 of 40 slices shown]
[im 1/40]
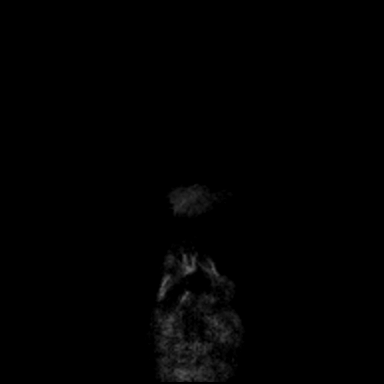
[im 20/40]
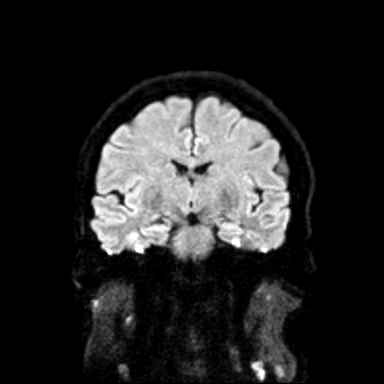
[im 40/40]
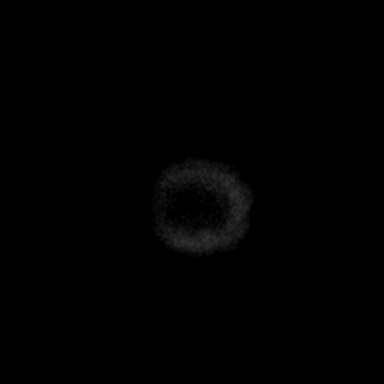

[Series 8: cor dwi_adc · coronal · 5.0mm · 0.60mm/px · 3 of 40 slices shown]
[im 1/40]
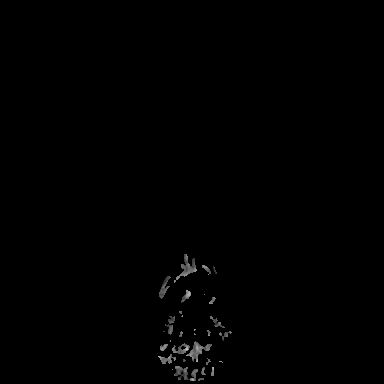
[im 20/40]
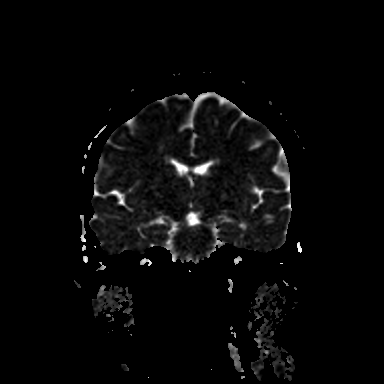
[im 40/40]
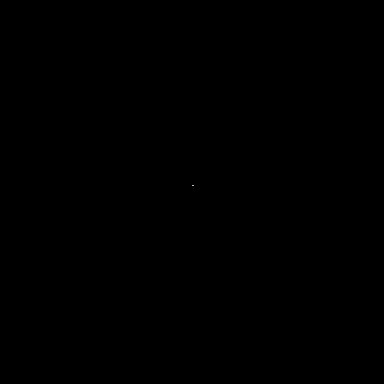

[Series 9: T1 · sagittal · 5.0mm · 0.62mm/px · 2 of 25 slices shown (1 of 2)]
[im 1/25]
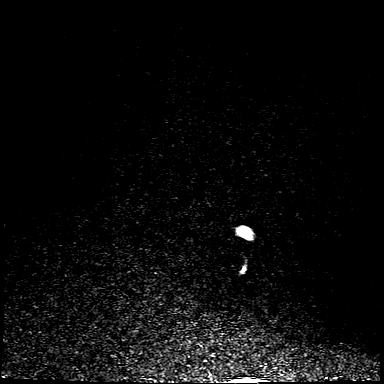
[im 25/25]
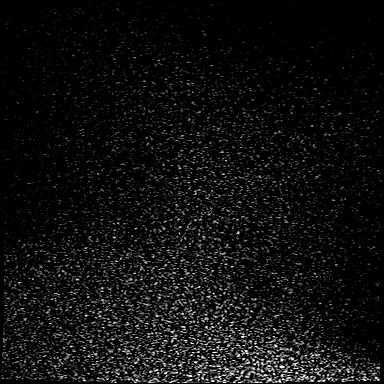

[Series 10: T2 · axial · 5.0mm · 0.53mm/px · z∈[-31,+113]mm · 2 of 25 slices shown (1 of 2)]
[im 1/25]
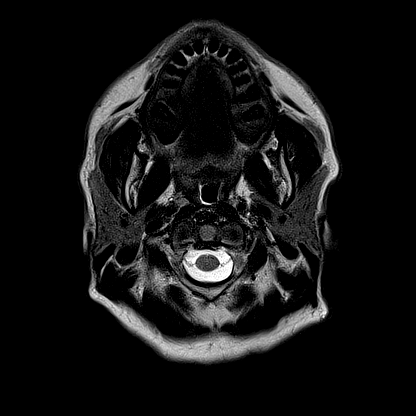
[im 25/25]
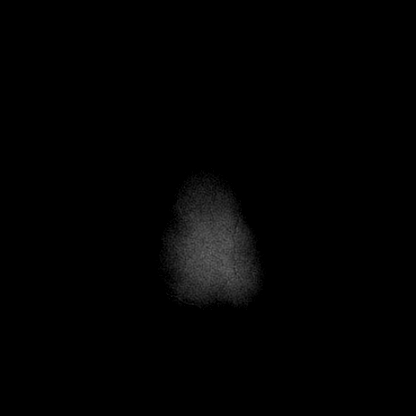

[Series 11: mag_images · axial · 3.0mm · 0.90mm/px · z∈[-48,+129]mm · 4 of 60 slices shown]
[im 1/60]
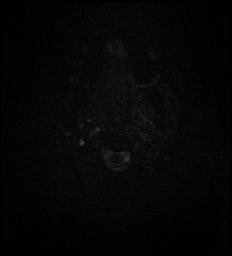
[im 20/60]
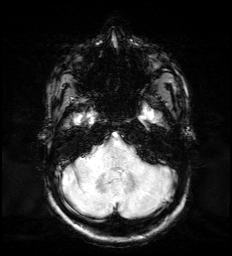
[im 40/60]
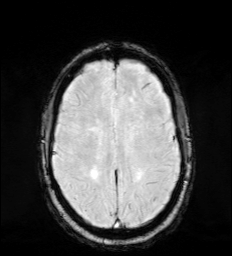
[im 60/60]
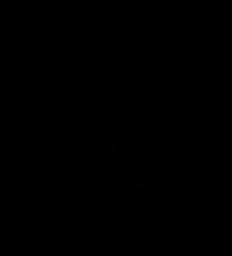

[Series 12: pha_images · axial · 3.0mm · 0.90mm/px · z∈[-48,+123]mm · 4 of 58 slices shown]
[im 1/58]
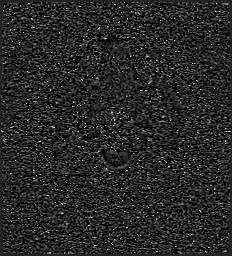
[im 20/58]
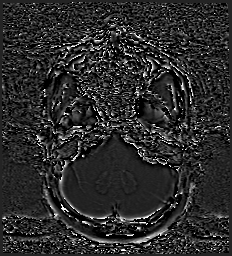
[im 39/58]
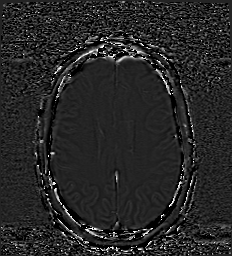
[im 58/58]
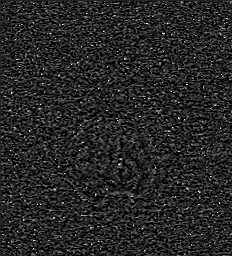

[Series 13: swi_images · axial · 3.0mm · 0.90mm/px · z∈[-48,+129]mm · 4 of 60 slices shown]
[im 1/60]
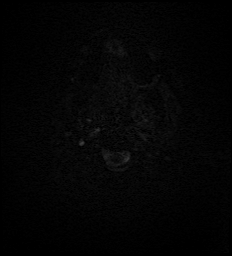
[im 20/60]
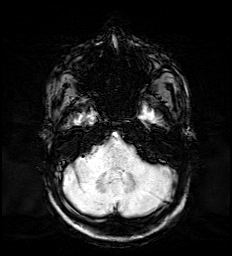
[im 40/60]
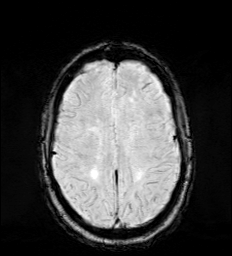
[im 60/60]
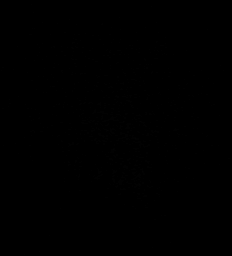

[Series 15: FLAIR · axial · 3.0mm · 0.53mm/px · z∈[-39,+122]mm · 4 of 55 slices shown]
[im 1/55]
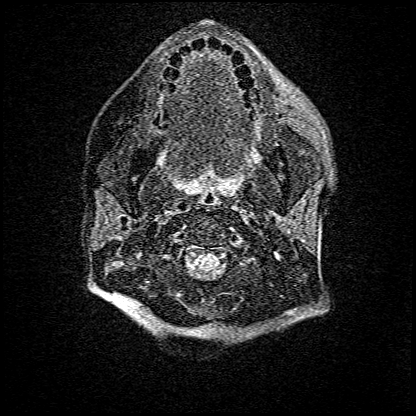
[im 19/55]
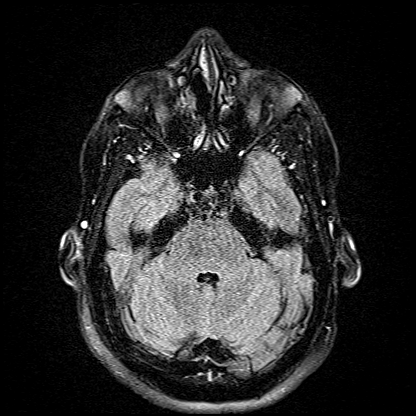
[im 37/55]
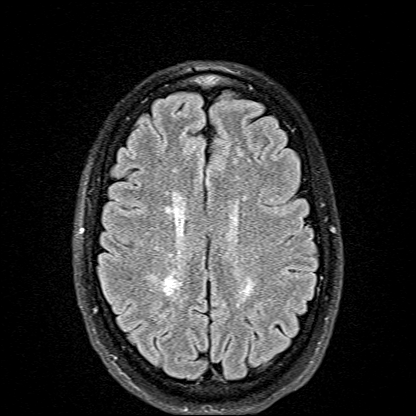
[im 55/55]
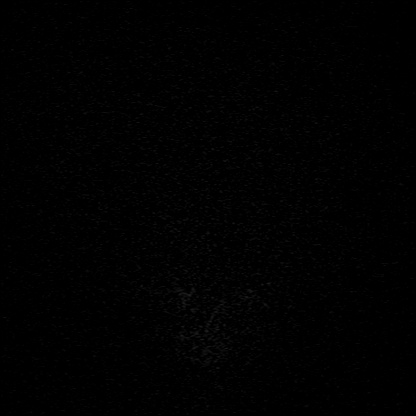

[Series 16: T1 · axial · 1.0mm · 0.98mm/px · z∈[-40,+134]mm · 11 of 176 slices shown (2 of 2)]
[im 1/176]
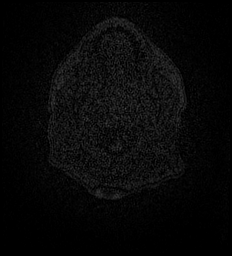
[im 15/176]
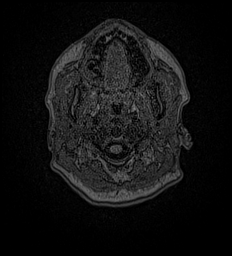
[im 30/176]
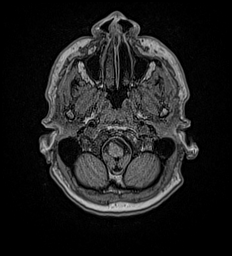
[im 44/176]
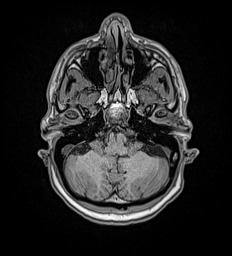
[im 59/176]
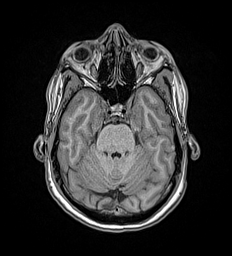
[im 73/176]
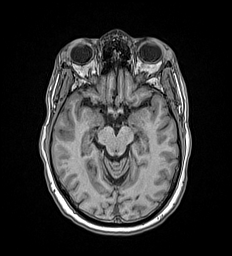
[im 88/176]
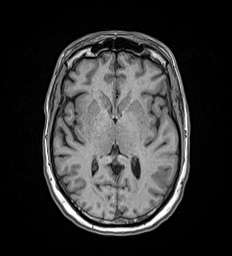
[im 103/176]
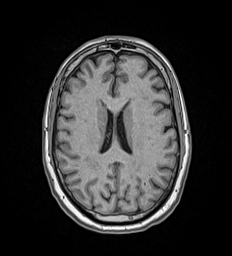
[im 117/176]
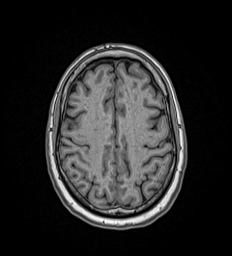
[im 146/176]
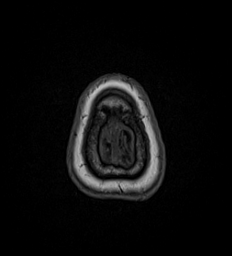
[im 176/176]
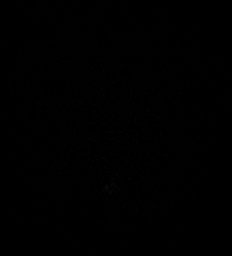

[Series 17: T2 · coronal · 5.0mm · 0.57mm/px · 2 of 29 slices shown (2 of 2)]
[im 1/29]
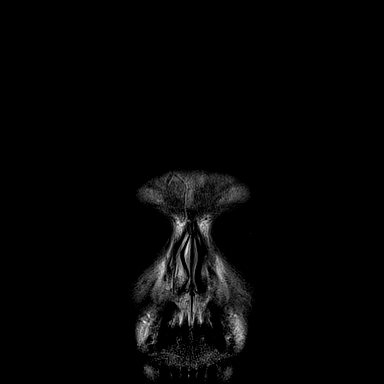
[im 29/29]
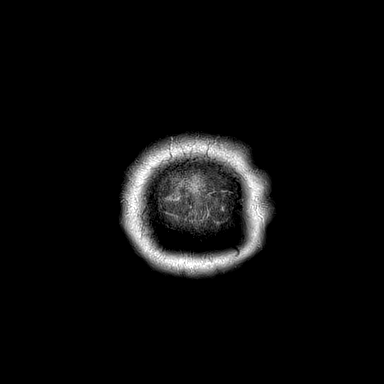

[46 of 48 positions shown; findings below may reference images not displayed]

FINDINGS: Brain: Subcentimeter weakly restricted diffusion at the body of the
left putamen. No hemorrhage, hydrocephalus, or masslike finding.
FLAIR hyperintensity in the cerebral white matter attributed to
chronic small vessel ischemia given history of hypertension. Normal
brain volume.

Vascular: Normal flow voids.

Skull and upper cervical spine: Normal marrow signal

Sinuses/Orbits: Negative.
IMPRESSION: 1. Acute or subacute infarct at the left caudate body.
2. Presumed chronic small vessel ischemia in the cerebral white
matter.

## 2020-04-19 MED ORDER — ASPIRIN EC 81 MG PO TBEC
81.0000 mg | DELAYED_RELEASE_TABLET | Freq: Every day | ORAL | Status: DC
  Administered 2020-04-19 – 2020-04-21 (×3): 81 mg via ORAL
  Filled 2020-04-19 (×3): qty 1

## 2020-04-19 MED ORDER — AMLODIPINE BESYLATE 5 MG PO TABS
5.0000 mg | ORAL_TABLET | Freq: Two times a day (BID) | ORAL | Status: DC
  Administered 2020-04-19 – 2020-04-20 (×3): 5 mg via ORAL
  Filled 2020-04-19 (×3): qty 1

## 2020-04-19 MED ORDER — CHLORHEXIDINE GLUCONATE CLOTH 2 % EX PADS: 6.0000 | MEDICATED_PAD | Freq: Every day | CUTANEOUS | Status: DC

## 2020-04-19 MED ORDER — POTASSIUM CHLORIDE CRYS ER 20 MEQ PO TBCR
40.0000 meq | EXTENDED_RELEASE_TABLET | Freq: Two times a day (BID) | ORAL | Status: AC
  Administered 2020-04-19 (×2): 40 meq via ORAL
  Filled 2020-04-19 (×2): qty 2

## 2020-04-19 NOTE — Progress Notes (Signed)
Brief note for transfer of care  54 yo F w/hx of HTN, active lifelong smoker, came in for facial drooping and upper extermity parasthesias experienced AM day of admission. Patient shares she was in her usual state of health until this am, was unable to move correctly out of bed asked for daughter to help who was in her home at that time. EMS called and patient BIBEMS with s/s of acute CVA. Code stroke activated with tPA protocol.  ICU admission for post tPA care and serial neuro evaluation.   Remains stable in ICU stay post-tPA, no complications.    [facility] will assume care of patient tomorrow 04/20/20.

## 2020-04-19 NOTE — Progress Notes (Signed)
*  PRELIMINARY RESULTS* Echocardiogram 2D Echocardiogram has been performed.  Lindsay Guzman 04/19/2020, 2:45 PM

## 2020-04-19 NOTE — Consult Note (Signed)
PHARMACY CONSULT NOTE  Pharmacy Consult for Electrolyte Monitoring and Replacement   Recent Labs: Potassium (mmol/L)  Date Value  04/19/2020 3.1 (L)   Calcium (mg/dL)  Date Value  55/73/2202 8.8 (L)   Albumin (g/dL)  Date Value  54/27/0623 4.2   Sodium (mmol/L)  Date Value  04/19/2020 137   Add-On magnesium: 2.1 mg/dL  Assessment: Lindsay Guzman is a 54 y.o. female presenting w/ CVA now s/p tPA 04/18/20  Goal of Therapy:  Electrolytes WNL  Plan:   40 mEq oral KCl x 2  Re-check electrolytes am 7/7  [name] ,PharmD Clinical Pharmacist 04/19/2020 12:26 PM

## 2020-04-19 NOTE — Progress Notes (Signed)
PT Cancellation Note  Patient Details Name: Lindsay Guzman MRN: [id number] DOB: July 18, 1966   Cancelled Treatment:    Reason Eval/Treat Not Completed: PT screened, no needs identified, will sign off   PT screened, no needs identified, will sign off. Order received, chart reviewed. Pt lying in bed on arrival, pleasant and agreeable and reports that she is at her functional baseline. During screen, pt able to demonstrate good coordination bilaterally with finger to nose and alternating supination/pronation as well as BLE movement without noted deficits. Pt witnessed ambulating to transport chair for room change and noted good standing balance and no deficits with transfers or ambulation. No acute PT needs identified at this time. Please re-consult if additional needs arise.   Frederich Chick 04/19/2020, 3:50 PM

## 2020-04-19 NOTE — Progress Notes (Signed)
OT Cancellation Note  Patient Details Name: Lindsay Guzman MRN: [id number] DOB: [birth year]   Cancelled Treatment:    Reason Eval/Treat Not Completed: Patient not medically ready. Order received, chart reviewed. Patient admitted to ICU for CVA workup with tPA infusion (ending 7/5, 11:13a). Per guidelines, pt to be on strict bedrest x24 hours post infusion. Will continue to follow and initiate as medically appropriate after imaging has been completed and pending plan of care.   Maurilio Lovely, OTS 04/19/20, 11:16 AM

## 2020-04-19 NOTE — Progress Notes (Signed)
Subjective: Patient reports no numbness or weakness today.  Feels back to baseline.  Objective: Current vital signs: BP (!) 154/119   Pulse (!) 59   Temp 98.5 F (36.9 C) (Oral)   Resp 19   Ht  (1.651 m)   Wt 74.5 kg   SpO2 99%   BMI 27.33 kg/m  Vital signs in last 24 hours: Temp:  [97.8 F (36.6 C)-98.5 F (36.9 C)] 98.5 F (36.9 C) (07/06 0813) Pulse Rate:  [58-83] 59 (07/06 1100) Resp:  [10-26] 19 (07/06 1225) BP: (126-177)/(86-132) 154/119 (07/06 1225) SpO2:  [93 %-100 %] 99 % (07/06 1100) Weight:  [74.5 kg] 74.5 kg (07/06 0100)  Intake/Output from previous day: 07/05 0701 - 07/06 0700 In: 174.4 [I.V.:174.4] Out: -  Intake/Output this shift: No intake/output data recorded. Nutritional status:  Diet Order            Diet 2 gram sodium Room service appropriate? Yes; Fluid consistency: Thin  Diet effective now                 Neurologic Exam: Mental Status: Alert, oriented, thought content appropriate.  Speech fluent without evidence of aphasia.  Able to follow 3 step commands without difficulty. Cranial Nerves: II: Discs flat bilaterally; Visual fields grossly normal, pupils equal, round, reactive to light and accommodation III,IV, VI: ptosis not present, extra-ocular motions intact bilaterally V,VII: smile symmetric, facial light touch sensation normal bilaterally VIII: hearing normal bilaterally IX,X: gag reflex present XI: bilateral shoulder shrug XII: midline tongue extension Motor: Right : Upper extremity   5/5    Left:     Upper extremity   5/5  Lower extremity   5/5     Lower extremity   5/5 Tone and bulk:normal tone throughout; no atrophy noted Sensory: Pinprick and light touch intact throughout, bilaterally Cerebellar: normal finger-to-nose, normal rapid alternating movements and normal heel-to-shin test   Lab Results: Basic Metabolic Panel: Recent Labs  Lab 04/18/20 0913 04/19/20 0857  NA 138 137  K 3.3* 3.1*  CL 102 100  CO2 26  24  GLUCOSE 109* 122*  BUN 13 16  CREATININE 0.87 0.91  CALCIUM 8.9 8.8*  MG  --  2.1    Liver Function Tests: Recent Labs  Lab 04/18/20 0913  AST 19  ALT 12  ALKPHOS 72  BILITOT 0.8  PROT 7.9  ALBUMIN 4.2   No results for input(s): LIPASE, AMYLASE in the last 168 hours. No results for input(s): AMMONIA in the last 168 hours.  CBC: Recent Labs  Lab 04/18/20 0913 04/19/20 0857  WBC 8.3 8.9  NEUTROABS 3.8  --   HGB 14.4 14.1  HCT 41.9 41.8  MCV 82.0 84.1  PLT 248 246    Cardiac Enzymes: No results for input(s): CKTOTAL, CKMB, CKMBINDEX, TROPONINI in the last 168 hours.  Lipid Panel: No results for input(s): CHOL, TRIG, HDL, CHOLHDL, VLDL, LDLCALC in the last 168 hours.  CBG: Recent Labs  Lab 04/18/20 0916 04/19/20 0047  GLUCAP 99 78    Microbiology: Results for orders placed or performed during the hospital encounter of 04/18/20  SARS Coronavirus 2 by RT PCR (hospital order, performed in [facility] hospital lab) Nasopharyngeal Nasopharyngeal Swab     Status: None   Collection Time: 04/18/20 10:55 AM   Specimen: Nasopharyngeal Swab  Result Value Ref Range Status   SARS Coronavirus 2 NEGATIVE NEGATIVE Final    Comment: (NOTE) SARS-CoV-2 target nucleic acids are NOT DETECTED.  The SARS-CoV-2  RNA is generally detectable in upper and lower respiratory specimens during the acute phase of infection. The lowest concentration of SARS-CoV-2 viral copies this assay can detect is 250 copies / mL. A negative result does not preclude SARS-CoV-2 infection and should not be used as the sole basis for treatment or other patient management decisions.  A negative result may occur with improper specimen collection / handling, submission of specimen other than nasopharyngeal swab, presence of viral mutation(s) within the areas targeted by this assay, and inadequate number of viral copies (<250 copies / mL). A negative result must be combined with clinical observations,  patient history, and epidemiological information.  Fact Sheet for Patients:   BoilerBrush.com.cy  Fact Sheet for Healthcare Providers: https://pope.com/  This test is not yet approved or  cleared by the [place] FDA and has been authorized for detection and/or diagnosis of SARS-CoV-2 by FDA under an Emergency Use Authorization (EUA).  This EUA will remain in effect (meaning this test can be used) for the duration of the COVID-19 declaration under Section 564(b)(1) of the Act, 21 U.S.C. section 360bbb-3(b)(1), unless the authorization is terminated or revoked sooner.  Performed at [facility], [address] [place], [place] [address]   MRSA PCR Screening     Status: None   Collection Time: 04/19/20 12:45 AM   Specimen: Nasopharyngeal  Result Value Ref Range Status   MRSA by PCR NEGATIVE NEGATIVE Final    Comment:        The GeneXpert MRSA Assay (FDA approved for NASAL specimens only), is one component of a comprehensive MRSA colonization surveillance program. It is not intended to diagnose MRSA infection nor to guide or monitor treatment for MRSA infections. Performed at [facility], [address] [place], [place] [address]     Coagulation Studies: Recent Labs    04/18/20 0947  LABPROT 13.6  INR 1.1    Imaging: MR BRAIN WO CONTRAST  Result Date: 04/19/2020 CLINICAL DATA:  Stroke follow-up after tPA EXAM: MRI HEAD WITHOUT CONTRAST TECHNIQUE: Multiplanar, multiecho pulse sequences of the brain and surrounding structures were obtained without intravenous contrast. COMPARISON:  Head CT and CTA from yesterday FINDINGS: Brain: Subcentimeter weakly restricted diffusion at the body of the left putamen. No hemorrhage, hydrocephalus, or masslike finding. FLAIR hyperintensity in the cerebral white matter attributed to chronic small vessel ischemia given history of hypertension. Normal brain volume.  Vascular: Normal flow voids. Skull and upper cervical spine: Normal marrow signal Sinuses/Orbits: Negative. IMPRESSION: 1. 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 2. Presumed chronic small vessel ischemia in the cerebral white matter. Electronically Signed   By: Marnee Spring M.D.   On: 04/19/2020 12:03   CT HEAD CODE STROKE WO CONTRAST  Addendum Date: 04/18/2020   ADDENDUM REPORT: 04/18/2020 09:43 ADDENDUM: Study discussed by telephone with Dr. Dorothea Glassman on 04/18/2020 at 0941 hours. Electronically Signed   By: Odessa Fleming M.D.   On: 04/18/2020 09:43   Result Date: 04/18/2020 CLINICAL DATA:  Code stroke. 54 year old female with right side weakness last known well 0830 hours. EXAM: CT HEAD WITHOUT CONTRAST TECHNIQUE: Contiguous axial images were obtained from the base of the skull through the vertex without intravenous contrast. COMPARISON:  Head CT 02/04/2010. FINDINGS: Brain: Cerebral volume remains normal for age. No midline shift, mass effect, or evidence of intracranial mass lesion. No ventriculomegaly. No acute intracranial hemorrhage identified. Patchy posterior hemisphere subcortical white matter hypodensity has increased since 2011. No cortically based acute infarct  identified. No cortical encephalomalacia identified. The deep gray nuclei remain within normal limits. Vascular: No suspicious intracranial vascular hyperdensity. Skull: Negative. Sinuses/Orbits: Visualized paranasal sinuses and mastoids are stable and well pneumatized. Other: Visualized orbits and scalp soft tissues are within normal limits. ASPECTS [facility] Stroke Program Early CT Score) Total score (0-10 with 10 being normal): 10 IMPRESSION: 1. No acute cortically based infarct or acute intracranial hemorrhage identified. ASPECTS 10. 2. Progressed nonspecific cerebral white matter changes since 2011. Electronically Signed: By: Odessa Fleming M.D. On: 04/18/2020 09:35   CT ANGIO HEAD CODE STROKE  Result Date: 04/18/2020 CLINICAL  DATA:  54 year old female code stroke presentation with right side symptoms last known well 0830 hours. EXAM: CT ANGIOGRAPHY HEAD AND NECK TECHNIQUE: Multidetector CT imaging of the head and neck was performed using the standard protocol during bolus administration of intravenous contrast. Multiplanar CT image reconstructions and MIPs were obtained to evaluate the vascular anatomy. Carotid stenosis measurements (when applicable) are obtained utilizing NASCET criteria, using the distal internal carotid diameter as the denominator. CONTRAST:  14mL OMNIPAQUE IOHEXOL 350 MG/ML SOLN COMPARISON:  Plain head CT 0924 hours today. FINDINGS: CTA NECK Skeleton: Negative. Upper chest: Negative. Other neck: Mild thyroid enlargement but no discrete thyroid nodule. Otherwise negative CT appearance of the neck soft tissues. Aortic arch: 3 vessel arch configuration with minimal arch atherosclerosis. Right carotid system: Tortuous brachiocephalic artery and proximal right CCA with no plaque or stenosis. Negative right carotid bifurcation. Tortuous right ICA distal to the bulb with a kinked appearance at the C2-C3 level (series 12, image 23), but no plaque or stenosis to the skull base. Left carotid system: Tortuous proximal left CCA. Mildly tortuous left ICA. No plaque or stenosis. Vertebral arteries: Tortuous proximal right subclavian artery with no plaque or stenosis. Normal right vertebral artery origin. Tortuous right V2 segment, but otherwise normal right vertebral to the skull base. Mildly tortuous proximal left subclavian artery with a normal left vertebral artery origin. Tortuous left V1 and V2 segments. The left vertebral appears mildly dominant and otherwise normal to the skull base. CTA HEAD Posterior circulation: Normal distal vertebral arteries and PICA origins. Normal vertebrobasilar junction. Patent basilar artery. Patent AICA, SCA and left PCA origins. Fetal type right PCA origin. Left posterior communicating artery  diminutive or absent. Bilateral PCA branches are within normal limits, the right posterior communicating artery is mildly tortuous and irregular (series 13, image 18) without stenosis. Anterior circulation: Both ICA siphons are patent. No siphon plaque or stenosis. Normal ophthalmic and left posterior communicating artery origins. Patent carotid termini, MCA and ACA origins. Tortuous ACA A1 segments. Anterior communicating artery and bilateral ACA branches are within normal limits. Left MCA M1 segment is tortuous. Left MCA bifurcates without stenosis. Left MCA branches are within normal limits. Right MCA M1 segment is mildly tortuous. Right MCA trifurcation is patent without stenosis. Right MCA branches are within normal limits. Venous sinuses: Patent. Anatomic variants: Mildly dominant left vertebral artery. Fetal right PCA origin. Review of the MIP images confirms the above findings IMPRESSION: Negative for large vessel occlusion. Generalized arterial tortuosity in the head and neck, but no atherosclerosis or stenosis. Electronically Signed   By: Odessa Fleming M.D.   On: 04/18/2020 10:47   CT ANGIO NECK CODE STROKE  Result Date: 04/18/2020 CLINICAL DATA:  54 year old female code stroke presentation with right side symptoms last known well 0830 hours. EXAM: CT ANGIOGRAPHY HEAD AND NECK TECHNIQUE: Multidetector CT imaging of the head and neck was performed using the  standard protocol during bolus administration of intravenous contrast. Multiplanar CT image reconstructions and MIPs were obtained to evaluate the vascular anatomy. Carotid stenosis measurements (when applicable) are obtained utilizing NASCET criteria, using the distal internal carotid diameter as the denominator. CONTRAST:  75mL OMNIPAQUE IOHEXOL 350 MG/ML SOLN COMPARISON:  Plain head CT 0924 hours today. FINDINGS: CTA NECK Skeleton: Negative. Upper chest: Negative. Other neck: Mild thyroid enlargement but no discrete thyroid nodule. Otherwise negative CT  appearance of the neck soft tissues. Aortic arch: 3 vessel arch configuration with minimal arch atherosclerosis. Right carotid system: Tortuous brachiocephalic artery and proximal right CCA with no plaque or stenosis. Negative right carotid bifurcation. Tortuous right ICA distal to the bulb with a kinked appearance at the C2-C3 level (series 12, image 23), but no plaque or stenosis to the skull base. Left carotid system: Tortuous proximal left CCA. Mildly tortuous left ICA. No plaque or stenosis. Vertebral arteries: Tortuous proximal right subclavian artery with no plaque or stenosis. Normal right vertebral artery origin. Tortuous right V2 segment, but otherwise normal right vertebral to the skull base. Mildly tortuous proximal left subclavian artery with a normal left vertebral artery origin. Tortuous left V1 and V2 segments. The left vertebral appears mildly dominant and otherwise normal to the skull base. CTA HEAD Posterior circulation: Normal distal vertebral arteries and PICA origins. Normal vertebrobasilar junction. Patent basilar artery. Patent AICA, SCA and left PCA origins. Fetal type right PCA origin. Left posterior communicating artery diminutive or absent. Bilateral PCA branches are within normal limits, the right posterior communicating artery is mildly tortuous and irregular (series 13, image 18) without stenosis. Anterior circulation: Both ICA siphons are patent. No siphon plaque or stenosis. Normal ophthalmic and left posterior communicating artery origins. Patent carotid termini, MCA and ACA origins. Tortuous ACA A1 segments. Anterior communicating artery and bilateral ACA branches are within normal limits. Left MCA M1 segment is tortuous. Left MCA bifurcates without stenosis. Left MCA branches are within normal limits. Right MCA M1 segment is mildly tortuous. Right MCA trifurcation is patent without stenosis. Right MCA branches are within normal limits. Venous sinuses: Patent. Anatomic variants:  Mildly dominant left vertebral artery. Fetal right PCA origin. Review of the MIP images confirms the above findings IMPRESSION: Negative for large vessel occlusion. Generalized arterial tortuosity in the head and neck, but no atherosclerosis or stenosis. Electronically Signed   By: Odessa FlemingH  Hall M.D.   On: 04/18/2020 10:47    Medications:  I have reviewed the patient's current medications. Scheduled: . amLODipine  5 mg Oral BID  . aspirin EC  81 mg Oral Daily  . atorvastatin  40 mg Oral Daily  . Chlorhexidine Gluconate Cloth  6 each Topical Daily  . potassium chloride  40 mEq Oral BID    Assessment/Plan: 54 y.o. female with a history of tobacco abuse and HTN (noncompliant with medications) who presented with right sided hemiparesis that started after awakening.  Initial NIHSS of 7.  Head CT personally reviewed and showed no acute changes.  Once target BP achieved, tPA administered.     Patient noted to have some hematuria but otherwise no complications overnight.  Reports being at baseline today.  NIHSS of 0.  MRI of the brain personally reviewed and shows an acute left caudate infarct.  Etiology likely small vessel disease.  CTA of the head and neck shows no hemodynamically significant stenosis.  Echocardiogram is pending.  A1c, LDL are pending.  Stroke Risk Factors - hypertension and smoking  Plan: 1. HgbA1c, fasting  lipid panel pending 2. PT consult, OT consult, Speech consult 3. Echocardiogram with bubble study pending 4. Repeat imaging today shows no evidence of hemorrhage.  ASA started today.  On tomorrow would start Plavix as well and continue DUAP for 3 weeks before changing to ASA 81mg  daily alone to be continued.  5. Telemetry monitoring 6. Frequent neuro checks 7. BP control  8. Smoking cessation counseling   LOS: 1 day   , MD Neurology [phone] 04/19/2020  12:54 PM

## 2020-04-19 NOTE — Progress Notes (Signed)
CRITICAL CARE PROGRESS NOTE    Name: Lindsay PaciniMichele W Ellenberger MRN: [id number] DOB: 01/16/1966     LOS: 1   SUBJECTIVE FINDINGS & SIGNIFICANT EVENTS    Patient description:  54 yo F w/hx of HTN, active lifelong smoker, came in for facial drooping and upper extermity parasthesias experienced AM day of admission. Patient shares she was in her usual state of health until this am, was unable to move correctly out of bed asked for daughter to help who was in her home at that time. EMS called and patient BIBEMS with s/s of acute CVA. Code stroke activated with tPA protocol.  ICU admission for post tPA care and serial neuro evaluation.   Lines/tubes : PIVx2  Microbiology/Sepsis markers: Results for orders placed or performed during the hospital encounter of 04/18/20  SARS Coronavirus 2 by RT PCR (hospital order, performed in [facility] hospital lab) Nasopharyngeal Nasopharyngeal Swab     Status: None   Collection Time: 04/18/20 10:55 AM   Specimen: Nasopharyngeal Swab  Result Value Ref Range Status   SARS Coronavirus 2 NEGATIVE NEGATIVE Final    Comment: (NOTE) SARS-CoV-2 target nucleic acids are NOT DETECTED.  The SARS-CoV-2 RNA is generally detectable in upper and lower respiratory specimens during the acute phase of infection. The lowest concentration of SARS-CoV-2 viral copies this assay can detect is 250 copies / mL. A negative result does not preclude SARS-CoV-2 infection and should not be used as the sole basis for treatment or other patient management decisions.  A negative result may occur with improper specimen collection / handling, submission of specimen other than nasopharyngeal swab, presence of viral mutation(s) within the areas targeted by this assay, and inadequate number of viral copies (<250 copies / mL).  A negative result must be combined with clinical observations, patient history, and epidemiological information.  Fact Sheet for Patients:   BoilerBrush.com.cyhttps://www.fda.gov/media/136312/download  Fact Sheet for Healthcare Providers: https://pope.com/https://www.fda.gov/media/136313/download  This test is not yet approved or  cleared by the [place] FDA and has been authorized for detection and/or diagnosis of SARS-CoV-2 by FDA under an Emergency Use Authorization (EUA).  This EUA will remain in effect (meaning this test can be used) for the duration of the COVID-19 declaration under Section 564(b)(1) of the Act, 21 U.S.C. section 360bbb-3(b)(1), unless the authorization is terminated or revoked sooner.  Performed at [facility], [address] [place], [place] [address]   MRSA PCR Screening     Status: None   Collection Time: 04/19/20 12:45 AM   Specimen: Nasopharyngeal  Result Value Ref Range Status   MRSA by PCR NEGATIVE NEGATIVE Final    Comment:        The GeneXpert MRSA Assay (FDA approved for NASAL specimens only), is one component of a comprehensive MRSA colonization surveillance program. It is not intended to diagnose MRSA infection nor to guide or monitor treatment for MRSA infections. Performed at [facility], [address] [place], [place] [address]     Anti-infectives:  Anti-infectives (From admission, onward)   None       Consults: Treatment Team:  [facility], Neuro1, MD     Tests / Events: 04/18/20- S/p TPA 04/19/20- patient without overnight events, will optimize [facility].    PAST MEDICAL HISTORY   Past Medical History:  Diagnosis Date  . Hypertension      SURGICAL HISTORY   Past Surgical History:  Procedure Laterality Date  . ABDOMINAL HYSTERECTOMY    .  CHOLECYSTECTOMY       FAMILY HISTORY   History reviewed. No pertinent family history.   SOCIAL HISTORY   Social History   Tobacco Use  . Smoking status:  Current Every Day Smoker  Substance Use Topics  . Alcohol use: Yes  . Drug use: Yes    Types: Marijuana     MEDICATIONS   Current Medication:  Current Facility-Administered Medications:  .  amLODipine (NORVASC) tablet 5 mg, 5 mg, Oral, BID, Karna Christmas, Stafford Riviera, MD .  aspirin EC tablet 81 mg, 81 mg, Oral, Daily, Addalyn Speedy, MD .  atorvastatin (LIPITOR) tablet 40 mg, 40 mg, Oral, Daily, Karna Christmas, Bereket Gernert, MD, 40 mg at 04/19/20 0910 .  Chlorhexidine Gluconate Cloth 2 % PADS 6 each, 6 each, Topical, Daily, Karna Christmas, Aryia Delira, MD .  docusate sodium (COLACE) capsule 100 mg, 100 mg, Oral, BID PRN, Karna Christmas, Iziah Cates, MD .  polyethylene glycol (MIRALAX / GLYCOLAX) packet 17 g, 17 g, Oral, Daily PRN, Vida Rigger, MD    ALLERGIES   Other and Penicillins    REVIEW OF SYSTEMS     10 point ROS done and is negative except thirst  PHYSICAL EXAMINATION   Vital Signs: Temp:  [97.8 F (36.6 C)-98.5 F (36.9 C)] 98.5 F (36.9 C) (07/06 0813) Pulse Rate:  [57-104] 62 (07/06 1000) Resp:  [10-26] 15 (07/06 1000) BP: (126-185)/(86-133) 157/98 (07/06 1000) SpO2:  [93 %-100 %] 99 % (07/06 1000) Weight:  [74.5 kg] 74.5 kg (07/06 0100)  GENERAL:NAD HEAD: Normocephalic, atraumatic.  EYES: Pupils equal, round, reactive to light.  No scleral icterus.  MOUTH: Moist mucosal membrane. NECK: Supple. No thyromegaly. No nodules. No JVD.  PULMONARY: CTAB CARDIOVASCULAR: S1 and S2. Regular rate and rhythm. No murmurs, rubs, or gallops.  GASTROINTESTINAL: Soft, nontender, non-distended. No masses. Positive bowel sounds. No hepatosplenomegaly.  MUSCULOSKELETAL: No swelling, clubbing, or edema.  NEUROLOGIC: Mild distress due to acute illness SKIN:intact,warm,dry   PERTINENT DATA     Infusions:  Scheduled Medications: . amLODipine  5 mg Oral BID  . aspirin EC  81 mg Oral Daily  . atorvastatin  40 mg Oral Daily  . Chlorhexidine Gluconate Cloth  6 each Topical Daily   PRN  Medications:  Hemodynamic parameters:   Intake/Output: 07/05 0701 - 07/06 0700 In: 174.4 [I.V.:174.4] Out: -   Ventilator  Settings:      LAB RESULTS:  Basic Metabolic Panel: Recent Labs  Lab 04/18/20 0913 04/19/20 0857  NA 138 137  K 3.3* 3.1*  CL 102 100  CO2 26 24  GLUCOSE 109* 122*  BUN 13 16  CREATININE 0.87 0.91  CALCIUM 8.9 8.8*   Liver Function Tests: Recent Labs  Lab 04/18/20 0913  AST 19  ALT 12  ALKPHOS 72  BILITOT 0.8  PROT 7.9  ALBUMIN 4.2   No results for input(s): LIPASE, AMYLASE in the last 168 hours. No results for input(s): AMMONIA in the last 168 hours. CBC: Recent Labs  Lab 04/18/20 0913 04/19/20 0857  WBC 8.3 8.9  NEUTROABS 3.8  --   HGB 14.4 14.1  HCT 41.9 41.8  MCV 82.0 84.1  PLT 248 246   Cardiac Enzymes: No results for input(s): CKTOTAL, CKMB, CKMBINDEX, TROPONINI in the last 168 hours. BNP: Invalid input(s): POCBNP CBG: Recent Labs  Lab 04/18/20 0916 04/19/20 0047  GLUCAP 99 78       IMAGING RESULTS:  Imaging: CT HEAD CODE STROKE WO CONTRAST  Addendum Date: 04/18/2020   ADDENDUM REPORT: 04/18/2020 09:43 ADDENDUM: Sarita Bottom  discussed by telephone with Dr. Dorothea Glassman on 04/18/2020 at 0941 hours. Electronically Signed   By: Odessa Fleming M.D.   On: 04/18/2020 09:43   Result Date: 04/18/2020 CLINICAL DATA:  Code stroke. 53 year old female with right side weakness last known well 0830 hours. EXAM: CT HEAD WITHOUT CONTRAST TECHNIQUE: Contiguous axial images were obtained from the base of the skull through the vertex without intravenous contrast. COMPARISON:  Head CT 02/04/2010. FINDINGS: Brain: Cerebral volume remains normal for age. No midline shift, mass effect, or evidence of intracranial mass lesion. No ventriculomegaly. No acute intracranial hemorrhage identified. Patchy posterior hemisphere subcortical white matter hypodensity has increased since 2011. No cortically based acute infarct identified. No cortical encephalomalacia  identified. The deep gray nuclei remain within normal limits. Vascular: No suspicious intracranial vascular hyperdensity. Skull: Negative. Sinuses/Orbits: Visualized paranasal sinuses and mastoids are stable and well pneumatized. Other: Visualized orbits and scalp soft tissues are within normal limits. ASPECTS [facility] Stroke Program Early CT Score) Total score (0-10 with 10 being normal): 10 IMPRESSION: 1. No acute cortically based infarct or acute intracranial hemorrhage identified. ASPECTS 10. 2. Progressed nonspecific cerebral white matter changes since 2011. Electronically Signed: By: Odessa Fleming M.D. On: 04/18/2020 09:35   CT ANGIO HEAD CODE STROKE  Result Date: 04/18/2020 CLINICAL DATA:  54 year old female code stroke presentation with right side symptoms last known well 0830 hours. EXAM: CT ANGIOGRAPHY HEAD AND NECK TECHNIQUE: Multidetector CT imaging of the head and neck was performed using the standard protocol during bolus administration of intravenous contrast. Multiplanar CT image reconstructions and MIPs were obtained to evaluate the vascular anatomy. Carotid stenosis measurements (when applicable) are obtained utilizing NASCET criteria, using the distal internal carotid diameter as the denominator. CONTRAST:  68mL OMNIPAQUE IOHEXOL 350 MG/ML SOLN COMPARISON:  Plain head CT 0924 hours today. FINDINGS: CTA NECK Skeleton: Negative. Upper chest: Negative. Other neck: Mild thyroid enlargement but no discrete thyroid nodule. Otherwise negative CT appearance of the neck soft tissues. Aortic arch: 3 vessel arch configuration with minimal arch atherosclerosis. Right carotid system: Tortuous brachiocephalic artery and proximal right CCA with no plaque or stenosis. Negative right carotid bifurcation. Tortuous right ICA distal to the bulb with a kinked appearance at the C2-C3 level (series 12, image 23), but no plaque or stenosis to the skull base. Left carotid system: Tortuous proximal left CCA. Mildly tortuous  left ICA. No plaque or stenosis. Vertebral arteries: Tortuous proximal right subclavian artery with no plaque or stenosis. Normal right vertebral artery origin. Tortuous right V2 segment, but otherwise normal right vertebral to the skull base. Mildly tortuous proximal left subclavian artery with a normal left vertebral artery origin. Tortuous left V1 and V2 segments. The left vertebral appears mildly dominant and otherwise normal to the skull base. CTA HEAD Posterior circulation: Normal distal vertebral arteries and PICA origins. Normal vertebrobasilar junction. Patent basilar artery. Patent AICA, SCA and left PCA origins. Fetal type right PCA origin. Left posterior communicating artery diminutive or absent. Bilateral PCA branches are within normal limits, the right posterior communicating artery is mildly tortuous and irregular (series 13, image 18) without stenosis. Anterior circulation: Both ICA siphons are patent. No siphon plaque or stenosis. Normal ophthalmic and left posterior communicating artery origins. Patent carotid termini, MCA and ACA origins. Tortuous ACA A1 segments. Anterior communicating artery and bilateral ACA branches are within normal limits. Left MCA M1 segment is tortuous. Left MCA bifurcates without stenosis. Left MCA branches are within normal limits. Right MCA M1 segment is mildly tortuous.  Right MCA trifurcation is patent without stenosis. Right MCA branches are within normal limits. Venous sinuses: Patent. Anatomic variants: Mildly dominant left vertebral artery. Fetal right PCA origin. Review of the MIP images confirms the above findings IMPRESSION: Negative for large vessel occlusion. Generalized arterial tortuosity in the head and neck, but no atherosclerosis or stenosis. Electronically Signed   By: Odessa Fleming M.D.   On: 04/18/2020 10:47   CT ANGIO NECK CODE STROKE  Result Date: 04/18/2020 CLINICAL DATA:  54 year old female code stroke presentation with right side symptoms last known  well 0830 hours. EXAM: CT ANGIOGRAPHY HEAD AND NECK TECHNIQUE: Multidetector CT imaging of the head and neck was performed using the standard protocol during bolus administration of intravenous contrast. Multiplanar CT image reconstructions and MIPs were obtained to evaluate the vascular anatomy. Carotid stenosis measurements (when applicable) are obtained utilizing NASCET criteria, using the distal internal carotid diameter as the denominator. CONTRAST:  75mL OMNIPAQUE IOHEXOL 350 MG/ML SOLN COMPARISON:  Plain head CT 0924 hours today. FINDINGS: CTA NECK Skeleton: Negative. Upper chest: Negative. Other neck: Mild thyroid enlargement but no discrete thyroid nodule. Otherwise negative CT appearance of the neck soft tissues. Aortic arch: 3 vessel arch configuration with minimal arch atherosclerosis. Right carotid system: Tortuous brachiocephalic artery and proximal right CCA with no plaque or stenosis. Negative right carotid bifurcation. Tortuous right ICA distal to the bulb with a kinked appearance at the C2-C3 level (series 12, image 23), but no plaque or stenosis to the skull base. Left carotid system: Tortuous proximal left CCA. Mildly tortuous left ICA. No plaque or stenosis. Vertebral arteries: Tortuous proximal right subclavian artery with no plaque or stenosis. Normal right vertebral artery origin. Tortuous right V2 segment, but otherwise normal right vertebral to the skull base. Mildly tortuous proximal left subclavian artery with a normal left vertebral artery origin. Tortuous left V1 and V2 segments. The left vertebral appears mildly dominant and otherwise normal to the skull base. CTA HEAD Posterior circulation: Normal distal vertebral arteries and PICA origins. Normal vertebrobasilar junction. Patent basilar artery. Patent AICA, SCA and left PCA origins. Fetal type right PCA origin. Left posterior communicating artery diminutive or absent. Bilateral PCA branches are within normal limits, the right  posterior communicating artery is mildly tortuous and irregular (series 13, image 18) without stenosis. Anterior circulation: Both ICA siphons are patent. No siphon plaque or stenosis. Normal ophthalmic and left posterior communicating artery origins. Patent carotid termini, MCA and ACA origins. Tortuous ACA A1 segments. Anterior communicating artery and bilateral ACA branches are within normal limits. Left MCA M1 segment is tortuous. Left MCA bifurcates without stenosis. Left MCA branches are within normal limits. Right MCA M1 segment is mildly tortuous. Right MCA trifurcation is patent without stenosis. Right MCA branches are within normal limits. Venous sinuses: Patent. Anatomic variants: Mildly dominant left vertebral artery. Fetal right PCA origin. Review of the MIP images confirms the above findings IMPRESSION: Negative for large vessel occlusion. Generalized arterial tortuosity in the head and neck, but no atherosclerosis or stenosis. Electronically Signed   By: Odessa Fleming M.D.   On: 04/18/2020 10:47   @ CT ANGIO HEAD CODE STROKE  Result Date: 04/18/2020 CLINICAL DATA:  54 year old female code stroke presentation with right side symptoms last known well 0830 hours. EXAM: CT ANGIOGRAPHY HEAD AND NECK TECHNIQUE: Multidetector CT imaging of the head and neck was performed using the standard protocol during bolus administration of intravenous contrast. Multiplanar CT image reconstructions and MIPs were obtained to evaluate the vascular  anatomy. Carotid stenosis measurements (when applicable) are obtained utilizing NASCET criteria, using the distal internal carotid diameter as the denominator. CONTRAST:  4mL OMNIPAQUE IOHEXOL 350 MG/ML SOLN COMPARISON:  Plain head CT 0924 hours today. FINDINGS: CTA NECK Skeleton: Negative. Upper chest: Negative. Other neck: Mild thyroid enlargement but no discrete thyroid nodule. Otherwise negative CT appearance of the neck soft tissues. Aortic arch: 3 vessel arch  configuration with minimal arch atherosclerosis. Right carotid system: Tortuous brachiocephalic artery and proximal right CCA with no plaque or stenosis. Negative right carotid bifurcation. Tortuous right ICA distal to the bulb with a kinked appearance at the C2-C3 level (series 12, image 23), but no plaque or stenosis to the skull base. Left carotid system: Tortuous proximal left CCA. Mildly tortuous left ICA. No plaque or stenosis. Vertebral arteries: Tortuous proximal right subclavian artery with no plaque or stenosis. Normal right vertebral artery origin. Tortuous right V2 segment, but otherwise normal right vertebral to the skull base. Mildly tortuous proximal left subclavian artery with a normal left vertebral artery origin. Tortuous left V1 and V2 segments. The left vertebral appears mildly dominant and otherwise normal to the skull base. CTA HEAD Posterior circulation: Normal distal vertebral arteries and PICA origins. Normal vertebrobasilar junction. Patent basilar artery. Patent AICA, SCA and left PCA origins. Fetal type right PCA origin. Left posterior communicating artery diminutive or absent. Bilateral PCA branches are within normal limits, the right posterior communicating artery is mildly tortuous and irregular (series 13, image 18) without stenosis. Anterior circulation: Both ICA siphons are patent. No siphon plaque or stenosis. Normal ophthalmic and left posterior communicating artery origins. Patent carotid termini, MCA and ACA origins. Tortuous ACA A1 segments. Anterior communicating artery and bilateral ACA branches are within normal limits. Left MCA M1 segment is tortuous. Left MCA bifurcates without stenosis. Left MCA branches are within normal limits. Right MCA M1 segment is mildly tortuous. Right MCA trifurcation is patent without stenosis. Right MCA branches are within normal limits. Venous sinuses: Patent. Anatomic variants: Mildly dominant left vertebral artery. Fetal right PCA origin.  Review of the MIP images confirms the above findings IMPRESSION: Negative for large vessel occlusion. Generalized arterial tortuosity in the head and neck, but no atherosclerosis or stenosis. Electronically Signed   By: Odessa Fleming M.D.   On: 04/18/2020 10:47   CT ANGIO NECK CODE STROKE  Result Date: 04/18/2020 CLINICAL DATA:  54 year old female code stroke presentation with right side symptoms last known well 0830 hours. EXAM: CT ANGIOGRAPHY HEAD AND NECK TECHNIQUE: Multidetector CT imaging of the head and neck was performed using the standard protocol during bolus administration of intravenous contrast. Multiplanar CT image reconstructions and MIPs were obtained to evaluate the vascular anatomy. Carotid stenosis measurements (when applicable) are obtained utilizing NASCET criteria, using the distal internal carotid diameter as the denominator. CONTRAST:  81mL OMNIPAQUE IOHEXOL 350 MG/ML SOLN COMPARISON:  Plain head CT 0924 hours today. FINDINGS: CTA NECK Skeleton: Negative. Upper chest: Negative. Other neck: Mild thyroid enlargement but no discrete thyroid nodule. Otherwise negative CT appearance of the neck soft tissues. Aortic arch: 3 vessel arch configuration with minimal arch atherosclerosis. Right carotid system: Tortuous brachiocephalic artery and proximal right CCA with no plaque or stenosis. Negative right carotid bifurcation. Tortuous right ICA distal to the bulb with a kinked appearance at the C2-C3 level (series 12, image 23), but no plaque or stenosis to the skull base. Left carotid system: Tortuous proximal left CCA. Mildly tortuous left ICA. No plaque or stenosis. Vertebral arteries: Tortuous  proximal right subclavian artery with no plaque or stenosis. Normal right vertebral artery origin. Tortuous right V2 segment, but otherwise normal right vertebral to the skull base. Mildly tortuous proximal left subclavian artery with a normal left vertebral artery origin. Tortuous left V1 and V2 segments. The  left vertebral appears mildly dominant and otherwise normal to the skull base. CTA HEAD Posterior circulation: Normal distal vertebral arteries and PICA origins. Normal vertebrobasilar junction. Patent basilar artery. Patent AICA, SCA and left PCA origins. Fetal type right PCA origin. Left posterior communicating artery diminutive or absent. Bilateral PCA branches are within normal limits, the right posterior communicating artery is mildly tortuous and irregular (series 13, image 18) without stenosis. Anterior circulation: Both ICA siphons are patent. No siphon plaque or stenosis. Normal ophthalmic and left posterior communicating artery origins. Patent carotid termini, MCA and ACA origins. Tortuous ACA A1 segments. Anterior communicating artery and bilateral ACA branches are within normal limits. Left MCA M1 segment is tortuous. Left MCA bifurcates without stenosis. Left MCA branches are within normal limits. Right MCA M1 segment is mildly tortuous. Right MCA trifurcation is patent without stenosis. Right MCA branches are within normal limits. Venous sinuses: Patent. Anatomic variants: Mildly dominant left vertebral artery. Fetal right PCA origin. Review of the MIP images confirms the above findings IMPRESSION: Negative for large vessel occlusion. Generalized arterial tortuosity in the head and neck, but no atherosclerosis or stenosis. Electronically Signed   By: Odessa Fleming M.D.   On: 04/18/2020 10:47     ASSESSMENT AND PLAN    -Multidisciplinary rounds held today  Acute CVA  - s/p tPA  - neurology on case - appreciate input - PT/OT/speech, NPO, repeat brain imaging  -neuro checks per post tPA protocol for 24h  -CT head reviewed -statin 40Lipitor qhs   Tobacco/thc smoking  -Counseling provided for smoking cessation  ICU monitoring -patient declines nicotine replacement    Accelerated hypertension    - permissive hypertension post CVA    - goal 160-180sbp -follow chem 7 -follow UO -continue  Foley Catheter-assess need daily   Hypokalemia  - replete Mg and K   ID -continue IV abx as prescibed -follow up cultures  GI/Nutrition GI PROPHYLAXIS as indicated DIET-->TF's as tolerated Constipation protocol as indicated  ENDO - ICU hypoglycemic\Hyperglycemia protocol -check FSBS per protocol   ELECTROLYTES -follow labs as needed -replace as needed -pharmacy consultation   DVT/GI PRX ordered -SCDs  TRANSFUSIONS AS NEEDED MONITOR FSBS ASSESS the need for LABS as needed   Critical care provider statement:    Critical care time (minutes):  34   Critical care time was exclusive of:  Separately billable procedures and treating other patients   Critical care was necessary to treat or prevent imminent or life-threatening deterioration of the following conditions:  Acute CVA, accelearted htn , electrolyte derrangement   Critical care was time spent personally by me on the following activities:  Development of treatment plan with patient or surrogate, discussions with consultants, evaluation of patient's response to treatment, examination of patient, obtaining history from patient or surrogate, ordering and performing treatments and interventions, ordering and review of laboratory studies and re-evaluation of patient's condition.  I assumed direction of critical care for this patient from another provider in my specialty: no    This document was prepared using Dragon voice recognition software and may include unintentional dictation errors.    Vida Rigger, M.D.  Division of Pulmonary & Critical Care Medicine  [facility]

## 2020-04-19 NOTE — Progress Notes (Signed)
OT Cancellation Note  Patient Details Name: Lindsay Guzman MRN: [id number] DOB: 1966-07-12   Cancelled Treatment:    Reason Eval/Treat Not Completed: OT screened, no needs identified, will sign off. Order received, chart reviewed. Pt lying in bed on arrival, pleasant and agreeable, reporting that she is at her functional baseline. During neuro screen, pt reported no changes in vision, equal sensation bilaterally and demonstrated equal bilateral RAM, FMC and gross motor movement without noted deficits. Pt independently donned socks at EOB and demonstrated good dynamic sitting and standing balance. No acute OT needs identified at this time. Please re-consult if additional needs arise.  Maurilio Lovely, OTS 04/19/20, 3:01 PM

## 2020-04-19 NOTE — Progress Notes (Signed)
Patient voided for the first time this shift. Urine was dark amber with pink tinted streaks. Urine was also foul smelling. Alerted Dr. Karna Christmas to my findings, no further orders at this time. Will continue to monitor.

## 2020-04-20 DIAGNOSIS — I10 Essential (primary) hypertension: Secondary | ICD-10-CM | POA: Diagnosis present

## 2020-04-20 LAB — PROTEIN / CREATININE RATIO, URINE
Creatinine, Urine: 32 mg/dL
Protein Creatinine Ratio: 0.25 mg/mg{Cre} — ABNORMAL HIGH (ref 0.00–0.15)
Total Protein, Urine: 8 mg/dL

## 2020-04-20 LAB — LIPID PANEL
Cholesterol: 195 mg/dL (ref 0–200)
HDL: 36 mg/dL — ABNORMAL LOW (ref >40)
LDL Cholesterol: 137 mg/dL — ABNORMAL HIGH (ref 0–99)
Total CHOL/HDL Ratio: 5.4 RATIO
Triglycerides: 111 mg/dL (ref <150)
VLDL: 22 mg/dL (ref 0–40)

## 2020-04-20 LAB — CBC
HCT: 40.1 % (ref 36.0–46.0)
Hemoglobin: 13.9 g/dL (ref 12.0–15.0)
MCH: 28.5 pg (ref 26.0–34.0)
MCHC: 34.7 g/dL (ref 30.0–36.0)
MCV: 82.2 fL (ref 80.0–100.0)
Platelets: 238 10*3/uL (ref 150–400)
RBC: 4.88 MIL/uL (ref 3.87–5.11)
RDW: 13.5 % (ref 11.5–15.5)
WBC: 7 10*3/uL (ref 4.0–10.5)
nRBC: 0 % (ref 0.0–0.2)

## 2020-04-20 LAB — RENAL FUNCTION PANEL
Albumin: 3.6 g/dL (ref 3.5–5.0)
Anion gap: 8 (ref 5–15)
BUN: 13 mg/dL (ref 6–20)
CO2: 27 mmol/L (ref 22–32)
Calcium: 8.8 mg/dL — ABNORMAL LOW (ref 8.9–10.3)
Chloride: 104 mmol/L (ref 98–111)
Creatinine, Ser: 0.83 mg/dL (ref 0.44–1.00)
GFR calc Af Amer: >60 mL/min (ref >60)
GFR calc non Af Amer: >60 mL/min (ref >60)
Glucose, Bld: 97 mg/dL (ref 70–99)
Phosphorus: 3 mg/dL (ref 2.5–4.6)
Potassium: 3.8 mmol/L (ref 3.5–5.1)
Sodium: 139 mmol/L (ref 135–145)

## 2020-04-20 LAB — MAGNESIUM: Magnesium: 1.9 mg/dL (ref 1.7–2.4)

## 2020-04-20 MED ORDER — TRIAMTERENE-HCTZ 37.5-25 MG PO TABS
1.0000 | ORAL_TABLET | Freq: Every day | ORAL | Status: DC
  Administered 2020-04-20: 1 via ORAL
  Filled 2020-04-20 (×2): qty 1

## 2020-04-20 MED ORDER — AMLODIPINE BESYLATE 5 MG PO TABS
5.0000 mg | ORAL_TABLET | Freq: Once | ORAL | Status: AC
  Administered 2020-04-20: 10:00:00 5 mg via ORAL
  Filled 2020-04-20: qty 1

## 2020-04-20 MED ORDER — HYDROCHLOROTHIAZIDE 25 MG PO TABS
25.0000 mg | ORAL_TABLET | Freq: Every day | ORAL | Status: DC
  Administered 2020-04-21: 25 mg via ORAL
  Filled 2020-04-20: qty 1

## 2020-04-20 MED ORDER — AMLODIPINE BESYLATE 10 MG PO TABS: 10.0000 mg | ORAL_TABLET | Freq: Two times a day (BID) | ORAL | Status: DC

## 2020-04-20 MED ORDER — LABETALOL HCL 5 MG/ML IV SOLN
20.0000 mg | INTRAVENOUS | Status: DC | PRN
  Administered 2020-04-21: 07:00:00 20 mg via INTRAVENOUS
  Filled 2020-04-20: qty 4

## 2020-04-20 MED ORDER — AMLODIPINE BESYLATE 10 MG PO TABS: 10.0000 mg | ORAL_TABLET | Freq: Every day | ORAL | Status: DC

## 2020-04-20 MED ORDER — MAGNESIUM OXIDE 400 (241.3 MG) MG PO TABS
400.0000 mg | ORAL_TABLET | Freq: Two times a day (BID) | ORAL | Status: AC
  Administered 2020-04-20 (×2): 400 mg via ORAL
  Filled 2020-04-20 (×2): qty 1

## 2020-04-20 MED ORDER — HYDRALAZINE HCL 50 MG PO TABS
75.0000 mg | ORAL_TABLET | Freq: Three times a day (TID) | ORAL | Status: DC
  Administered 2020-04-20 – 2020-04-21 (×2): 75 mg via ORAL
  Filled 2020-04-20 (×2): qty 2

## 2020-04-20 MED ORDER — HYDRALAZINE HCL 50 MG PO TABS
50.0000 mg | ORAL_TABLET | Freq: Three times a day (TID) | ORAL | Status: DC
  Administered 2020-04-20: 13:00:00 50 mg via ORAL
  Filled 2020-04-20: qty 1

## 2020-04-20 MED ORDER — LOSARTAN POTASSIUM 50 MG PO TABS
50.0000 mg | ORAL_TABLET | Freq: Every day | ORAL | Status: DC
  Administered 2020-04-21: 08:00:00 50 mg via ORAL
  Filled 2020-04-20: qty 1

## 2020-04-20 MED ORDER — AMLODIPINE BESYLATE 10 MG PO TABS
10.0000 mg | ORAL_TABLET | Freq: Every day | ORAL | Status: DC
  Administered 2020-04-21: 10 mg via ORAL
  Filled 2020-04-20: qty 1

## 2020-04-20 NOTE — Progress Notes (Signed)
Subjective: No new neurological complaints.  Remains at baseline.  Objective: Current vital signs: BP (!) 159/115 (BP Location: Right Arm)   Pulse 70   Temp 98.3 F (36.8 C) (Oral)   Resp 20   Ht  (1.651 m)   Wt 74.5 kg   SpO2 100%   BMI 27.33 kg/m  Vital signs in last 24 hours: Temp:  [97.9 F (36.6 C)-98.6 F (37 C)] 98.3 F (36.8 C) (07/07 0809) Pulse Rate:  [59-70] 70 (07/07 0809) Resp:  [12-20] 20 (07/07 0809) BP: (132-178)/(81-119) 159/115 (07/07 0809) SpO2:  [98 %-100 %] 100 % (07/07 0809)  Intake/Output from previous day: No intake/output data recorded. Intake/Output this shift: No intake/output data recorded. Nutritional status:  Diet Order            Diet 2 gram sodium Room service appropriate? Yes; Fluid consistency: Thin  Diet effective now                 Neurologic Exam: Mental Status: Alert, oriented, thought content appropriate.  Speech fluent without evidence of aphasia.  Able to follow 3 step commands without difficulty. Cranial Nerves: II: Discs flat bilaterally; Visual fields grossly normal, pupils equal, round, reactive to light and accommodation III,IV, VI: ptosis not present, extra-ocular motions intact bilaterally V,VII: smile symmetric, facial light touch sensation normal bilaterally VIII: hearing normal bilaterally IX,X: gag reflex present XI: bilateral shoulder shrug XII: midline tongue extension Motor: 5/5 throughout Sensory: Pinprick and light touch intact throughout, bilaterally   Lab Results: Basic Metabolic Panel: Recent Labs  Lab 04/18/20 0913 04/19/20 0857 04/20/20 0438  NA 138 137 139  K 3.3* 3.1* 3.8  CL 102 100 104  CO2 GLUCOSE 109* 122* 97  BUN CREATININE 0.87 0.91 0.83  CALCIUM 8.9 8.8* 8.8*  MG  --  2.1 1.9  PHOS  --   --  3.0    Liver Function Tests: Recent Labs  Lab 04/18/20 0913 04/20/20 0438  AST 19  --   ALT 12  --   ALKPHOS 72  --   BILITOT 0.8  --   PROT 7.9  --    ALBUMIN 4.2 3.6   No results for input(s): LIPASE, AMYLASE in the last 168 hours. No results for input(s): AMMONIA in the last 168 hours.  CBC: Recent Labs  Lab 04/18/20 0913 04/19/20 0857 04/20/20 0438  WBC 8.3 8.9 7.0  NEUTROABS 3.8  --   --   HGB 14.4 14.1 13.9  HCT 41.9 41.8 40.1  MCV 82.0 84.1 82.2  PLT 248 246 238    Cardiac Enzymes: No results for input(s): CKTOTAL, CKMB, CKMBINDEX, TROPONINI in the last 168 hours.  Lipid Panel: Recent Labs  Lab 04/20/20 0438  CHOL 195  TRIG 111  HDL 36*  CHOLHDL 5.4  VLDL 22  LDLCALC 284*    CBG: Recent Labs  Lab 04/18/20 0916 04/19/20 0047  GLUCAP 99 78    Microbiology: Results for orders placed or performed during the hospital encounter of 04/18/20  SARS Coronavirus 2 by RT PCR (hospital order, performed in [facility] hospital lab) Nasopharyngeal Nasopharyngeal Swab     Status: None   Collection Time: 04/18/20 10:55 AM   Specimen: Nasopharyngeal Swab  Result Value Ref Range Status   SARS Coronavirus 2 NEGATIVE NEGATIVE Final    Comment: (NOTE) SARS-CoV-2 target nucleic acids are NOT DETECTED.  The SARS-CoV-2 RNA is generally detectable in upper and lower respiratory  specimens during the acute phase of infection. The lowest concentration of SARS-CoV-2 viral copies this assay can detect is 250 copies / mL. A negative result does not preclude SARS-CoV-2 infection and should not be used as the sole basis for treatment or other patient management decisions.  A negative result may occur with improper specimen collection / handling, submission of specimen other than nasopharyngeal swab, presence of viral mutation(s) within the areas targeted by this assay, and inadequate number of viral copies (<250 copies / mL). A negative result must be combined with clinical observations, patient history, and epidemiological information.  Fact Sheet for Patients:   BoilerBrush.com.cyhttps://www.fda.gov/media/136312/download  Fact Sheet for  Healthcare Providers: https://pope.com/https://www.fda.gov/media/136313/download  This test is not yet approved or  cleared by the [place] FDA and has been authorized for detection and/or diagnosis of SARS-CoV-2 by FDA under an Emergency Use Authorization (EUA).  This EUA will remain in effect (meaning this test can be used) for the duration of the COVID-19 declaration under Section 564(b)(1) of the Act, 21 U.S.C. section 360bbb-3(b)(1), unless the authorization is terminated or revoked sooner.  Performed at [facility], [address] [place], [place] [address]   MRSA PCR Screening     Status: None   Collection Time: 04/19/20 12:45 AM   Specimen: Nasopharyngeal  Result Value Ref Range Status   MRSA by PCR NEGATIVE NEGATIVE Final    Comment:        The GeneXpert MRSA Assay (FDA approved for NASAL specimens only), is one component of a comprehensive MRSA colonization surveillance program. It is not intended to diagnose MRSA infection nor to guide or monitor treatment for MRSA infections. Performed at [facility], [address] [place], [place] [address]     Coagulation Studies: Recent Labs    04/18/20 0947  LABPROT 13.6  INR 1.1    Imaging: MR BRAIN WO CONTRAST  Result Date: 04/19/2020 CLINICAL DATA:  Stroke follow-up after tPA EXAM: MRI HEAD WITHOUT CONTRAST TECHNIQUE: Multiplanar, multiecho pulse sequences of the brain and surrounding structures were obtained without intravenous contrast. COMPARISON:  Head CT and CTA from yesterday FINDINGS: Brain: Subcentimeter weakly restricted diffusion at the body of the left putamen. No hemorrhage, hydrocephalus, or masslike finding. FLAIR hyperintensity in the cerebral white matter attributed to chronic small vessel ischemia given history of hypertension. Normal brain volume. Vascular: Normal flow voids. Skull and upper cervical spine: Normal marrow signal Sinuses/Orbits: Negative. IMPRESSION: 1. 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 2. Presumed chronic small vessel ischemia in the cerebral white matter. Electronically Signed   By: Marnee SpringJonathon  Watts M.D.   On: 04/19/2020 12:03   ECHOCARDIOGRAM COMPLETE BUBBLE STUDY  Result Date: 04/19/2020    ECHOCARDIOGRAM REPORT   Patient Name:   Lindsay Guzman Date of Exam: 04/19/2020 Medical Rec #:  [id number]       Height:       65.0 in Accession #:    [id number]      Weight:       164.2 lb Date of Birth:  01/17/1966        BSA:          1.819 m Patient Age:    54 years        BP:           154/119 mmHg Patient Gender: F               HR:  61 bpm. Exam Location:  [facility] Procedure: 2D Echo, Cardiac Doppler, Color Doppler and Saline Contrast Bubble            Study Indications:     Stroke 434.91  History:         Patient has no prior history of Echocardiogram examinations.                  Risk Factors:Hypertension.  Sonographer:     Cristela Blue RDCS (AE) Referring Phys:  [id number] [address] Arnell Diagnosing Phys: Harold Hedge MD IMPRESSIONS  1. Left ventricular ejection fraction, by estimation, is 65 to 70%. The left ventricle has normal function. The left ventricle has no regional wall motion abnormalities. There is mild left ventricular hypertrophy. Left ventricular diastolic parameters are consistent with Grade I diastolic dysfunction (impaired relaxation).  2. Right ventricular systolic function is normal. The right ventricular size is normal. There is normal pulmonary artery systolic pressure.  3. The mitral valve is grossly normal. Trivial mitral valve regurgitation.  4. The aortic valve is grossly normal. Aortic valve regurgitation is not visualized.  5. Agitated saline contrast bubble study was negative, with no evidence of any interatrial shunt. FINDINGS  Left Ventricle: Left ventricular ejection fraction, by estimation, is 65 to 70%. The left ventricle has normal function. The left ventricle has no regional wall motion abnormalities. The left ventricular internal  cavity size was normal in size. There is  mild left ventricular hypertrophy. Left ventricular diastolic parameters are consistent with Grade I diastolic dysfunction (impaired relaxation). Right Ventricle: The right ventricular size is normal. No increase in right ventricular wall thickness. Right ventricular systolic function is normal. There is normal pulmonary artery systolic pressure. The tricuspid regurgitant velocity is 1.81 m/s, and  with an assumed right atrial pressure of 10 mmHg, the estimated right ventricular systolic pressure is 23.1 mmHg. Left Atrium: Left atrial size was normal in size. Right Atrium: Right atrial size was normal in size. Pericardium: There is no evidence of pericardial effusion. Mitral Valve: The mitral valve is grossly normal. Trivial mitral valve regurgitation. Tricuspid Valve: The tricuspid valve is not well visualized. Tricuspid valve regurgitation is trivial. Aortic Valve: The aortic valve is grossly normal. Aortic valve regurgitation is not visualized. Aortic valve mean gradient measures 3.5 mmHg. Aortic valve peak gradient measures 7.1 mmHg. Aortic valve area, by VTI measures 3.15 cm. Pulmonic Valve: The pulmonic valve was not well visualized. Pulmonic valve regurgitation is trivial. Aorta: The aortic root is normal in size and structure. IAS/Shunts: No atrial level shunt detected by color flow Doppler. Agitated saline contrast was given intravenously to evaluate for intracardiac shunting. Agitated saline contrast bubble study was negative, with no evidence of any interatrial shunt.  LEFT VENTRICLE PLAX 2D LVIDd:         4.30 cm  Diastology LVIDs:         2.25 cm  LV e' lateral:   4.57 cm/s LV PW:         1.20 cm  LV E/e' lateral: 14.2 LV IVS:        1.10 cm  LV e' medial:    5.00 cm/s LVOT diam:     2.00 cm  LV E/e' medial:  13.0 LV SV:         72 LV SV Index:   40 LVOT Area:     3.14 cm  RIGHT VENTRICLE RV Basal diam:  2.77 cm TAPSE (M-mode): 2.6 cm LEFT ATRIUM  Index       RIGHT ATRIUM           Index LA diam:        3.40 cm 1.87 cm/m  RA Area:     17.30 cm LA Vol (A2C):   49.8 ml 27.37 ml/m RA Volume:   48.10 ml  26.44 ml/m LA Vol (A4C):   50.8 ml 27.92 ml/m LA Biplane Vol: 52.1 ml 28.64 ml/m  AORTIC VALVE                   PULMONIC VALVE AV Area (Vmax):    3.05 cm    PV Vmax:        0.55 m/s AV Area (Vmean):   2.99 cm    PV Peak grad:   1.2 mmHg AV Area (VTI):     3.15 cm    RVOT Peak grad: 2 mmHg AV Vmax:           133.00 cm/s AV Vmean:          87.300 cm/s AV VTI:            0.230 m AV Peak Grad:      7.1 mmHg AV Mean Grad:      3.5 mmHg LVOT Vmax:         129.00 cm/s LVOT Vmean:        83.100 cm/s LVOT VTI:          0.230 m LVOT/AV VTI ratio: 1.00  AORTA Ao Root diam: 2.90 cm MITRAL VALVE               TRICUSPID VALVE MV Area (PHT): 3.21 cm    TR Peak grad:   13.1 mmHg MV Decel Time: 236 msec    TR Vmax:        181.00 cm/s MV E velocity: 65.10 cm/s MV A velocity: 96.80 cm/s  SHUNTS MV E/A ratio:  0.67        Systemic VTI:  0.23 m                            Systemic Diam: 2.00 cm Harold Hedge MD Electronically signed by Harold Hedge MD Signature Date/Time: 04/19/2020/4:33:00 PM    Final     Medications:  I have reviewed the patient's current medications. Scheduled: . amLODipine  10 mg Oral Daily  . aspirin EC  81 mg Oral Daily  . atorvastatin  40 mg Oral Daily  . magnesium oxide  400 mg Oral BID  . triamterene-hydrochlorothiazide  1 tablet Oral Daily    Assessment/Plan: 54 y.o.femalewith a history of tobacco abuse and HTN (noncompliant with medications) who presented with right sided hemiparesis that started after awakening. Initial NIHSS of 7. Head CT personally reviewed and showed no acute changes.  Once target BP achieved, tPA administered.  Reports being at baseline today.  NIHSS of 0.  MRI of the brain personally reviewed and shows an acute left caudate infarct.  Etiology likely small vessel disease.  CTA of the head and neck shows no  hemodynamically significant stenosis. Echocardiogram shows no cardiac source of emboli or PFO with an EF of 65-70%.  A1c 5.6, LDL 137.  Stroke Risk Factors -hypertension and smoking  Plan: 1. Statin for lipid management with target LDL<70. 2. Dual antiplatelet therapy with ASA 81mg  and Plavix 75mg  for three weeks with change to ASA 81mg  daily alone as monotherapy after that time. 3. Telemetry  monitoring 4. Frequent neuro checks 5. BP control  6. Smoking cessation counseling    LOS: 2 days   Thana Farr, MD Neurology [phone] 04/20/2020  10:50 AM

## 2020-04-20 NOTE — Progress Notes (Signed)
PROGRESS NOTE    Lindsay Guzman   IOX:[id number]  DOB: 08-05-1966  PCP: Patient, No Pcp Per    DOA: 04/18/2020 LOS: 2   Brief Narrative   54 yo F w/hx of HTN, active lifelong smoker, came in for facial drooping and upper extermity parasthesias experienced AM day of admission. Patient shares she was in her usual state of health until this am, was unable to move correctly out of bed asked for daughter to help who was in her home at that time. EMS called and patient BIBEMS with s/s of acute CVA. Code stroke activated with tPA protocol. ICU admission for post tPA care and serial neuro evaluation.   Transferred to MedSurg and hospitalist service on 7/7.    Optimizing blood pressure control prior to discharge.  Assessment & Plan   Active Problems:   CVA (cerebral vascular accident) (HCC)   Essential hypertension   Acute CVA -present on admission with MRI showing acute left caudate infarct likely due to small vessel disease.  CTA of head and neck showed no hemodynamically significant stenosis.  Echo showed no cardiac source of emboli or PFO, EF was 65 to 70%.  A1c is 5.6%, LDL 137.  Stroke risk factors include uncontrolled hypertension and smoking.  Patient was given TPA, now with NIHSS of 0.  Neurology following.  Patient was cleared by PT OT and speech.   Control blood pressure with goal of 140s over 80s for now.  Continue DAPT with aspirin Plavix for 3 weeks, then aspirin monotherapy indefinitely.  Strongly advised smoking cessation.  Hypertension - chronic, uncontrolled.  Patient was not on any medications prior to admission.  She has been started on amlodipine, hydralazine and HCTZ/triamterene.  Neurology recommended BP control to 140s over 80s for now.   Patient BMI: Body mass index is 27.33 kg/m.   DVT prophylaxis: SCDs Start: 04/18/20 1207   Diet:  Diet Orders (From admission, onward)    Start     Ordered   04/19/20 0059  Diet 2 gram sodium Room service appropriate? Yes; Fluid  consistency: Thin  Diet effective now       Question Answer Comment  Room service appropriate? Yes   Fluid consistency: Thin      04/19/20 0058            Code Status: Full Code    Subjective 04/20/20    Patient seen this morning at bedside.  She reports feeling well and has no acute complaints.  She specifically denies headache, vision changes, weakness numbness or tingling, difficulty speaking or swallowing.   Disposition Plan & Communication   Status is: Inpatient  Remains inpatient appropriate because:patient's BP is still uncontrolled and medications are being titrated.  It is unsafe to d/c patient with acute CVA until BP is controlled.   Dispo: The patient is from: Home              Anticipated d/c is to: Home              Anticipated d/c date is: 1 day              Patient currently is not medically stable to d/c.    Family Communication: None at bedside, will attempt to call   Consults, Procedures, Significant Events   Consultants:   Neurology  Procedures:   TPA   Antimicrobials:   None   Objective   Vitals:   04/20/20 0809 04/20/20 1150 04/20/20 1339 04/20/20 1535  BP: Marland Kitchen)  159/115 (!) 175/109 (!) 157/100 (!) 155/96  Pulse: 70 72 84 83  Resp: 20 16  18   Temp: 98.3 F (36.8 C) 98.4 F (36.9 C)  98.6 F (37 C)  TempSrc: Oral Oral  Oral  SpO2: 100% 100% 99% 100%  Weight:      Height:       No intake or output data in the 24 hours ending 04/20/20 1740 Filed Weights   04/18/20 0928 04/19/20 0050 04/19/20 0100  Weight: 76.9 kg 74.5 kg 74.5 kg    Physical Exam:  General exam: awake, alert, no acute distress HEENT: atraumatic, clear conjunctiva, anicteric sclera, moist mucus membranes, hearing grossly normal  Respiratory system: CTAB, no wheezes, rales or rhonchi, normal respiratory effort. Cardiovascular system: normal S1/S2, RRR, no JVD, murmurs, rubs, gallops, no pedal edema.   Gastrointestinal system: soft, NT, ND, no HSM felt, +bowel  sounds. Central nervous system: A&O x4. normal speech, CNs 2 through 12 intact, 5/5 motor in all extremities sensation equal and intact throughout Extremities: moves all, no edema, normal tone Skin: dry, intact, normal temperature Psychiatry: normal mood, congruent affect, judgement and insight appear normal  Labs   Data Reviewed: I have personally reviewed following labs and imaging studies  CBC: Recent Labs  Lab 04/18/20 0913 04/19/20 0857 04/20/20 0438  WBC 8.3 8.9 7.0  NEUTROABS 3.8  --   --   HGB 14.4 14.1 13.9  HCT 41.9 41.8 40.1  MCV 82.0 84.1 82.2  PLT 248 246 238   Basic Metabolic Panel: Recent Labs  Lab 04/18/20 0913 04/19/20 0857 04/20/20 0438  NA 138 137 139  K 3.3* 3.1* 3.8  CL 102 100 104  CO2 26 24 27   GLUCOSE 109* 122* 97  BUN 13 16 13   CREATININE 0.87 0.91 0.83  CALCIUM 8.9 8.8* 8.8*  MG  --  2.1 1.9  PHOS  --   --  3.0   GFR: Estimated Creatinine Clearance: 77.4 mL/min (by C-G formula based on SCr of 0.83 mg/dL). Liver Function Tests: Recent Labs  Lab 04/18/20 0913 04/20/20 0438  AST 19  --   ALT 12  --   ALKPHOS 72  --   BILITOT 0.8  --   PROT 7.9  --   ALBUMIN 4.2 3.6   No results for input(s): LIPASE, AMYLASE in the last 168 hours. No results for input(s): AMMONIA in the last 168 hours. Coagulation Profile: Recent Labs  Lab 04/18/20 0947  INR 1.1   Cardiac Enzymes: No results for input(s): CKTOTAL, CKMB, CKMBINDEX, TROPONINI in the last 168 hours. BNP (last 3 results) No results for input(s): PROBNP in the last 8760 hours. HbA1C: Recent Labs    04/19/20 0902  HGBA1C 5.6   CBG: Recent Labs  Lab 04/18/20 0916 04/19/20 0047  GLUCAP 99 78   Lipid Profile: Recent Labs    04/20/20 0438  CHOL 195  HDL 36*  LDLCALC 137*  TRIG 111  CHOLHDL 5.4   Thyroid Function Tests: No results for input(s): TSH, T4TOTAL, FREET4, T3FREE, THYROIDAB in the last 72 hours. Anemia Panel: No results for input(s): VITAMINB12, FOLATE,  FERRITIN, TIBC, IRON, RETICCTPCT in the last 72 hours. Sepsis Labs: No results for input(s): PROCALCITON, LATICACIDVEN in the last 168 hours.  Recent Results (from the past 240 hour(s))  SARS Coronavirus 2 by RT PCR (hospital order, performed in [facility] hospital lab) Nasopharyngeal Nasopharyngeal Swab     Status: None   Collection Time: 04/18/20 10:55 AM   Specimen:  Nasopharyngeal Swab  Result Value Ref Range Status   SARS Coronavirus 2 NEGATIVE NEGATIVE Final    Comment: (NOTE) SARS-CoV-2 target nucleic acids are NOT DETECTED.  The SARS-CoV-2 RNA is generally detectable in upper and lower respiratory specimens during the acute phase of infection. The lowest concentration of SARS-CoV-2 viral copies this assay can detect is 250 copies / mL. A negative result does not preclude SARS-CoV-2 infection and should not be used as the sole basis for treatment or other patient management decisions.  A negative result may occur with improper specimen collection / handling, submission of specimen other than nasopharyngeal swab, presence of viral mutation(s) within the areas targeted by this assay, and inadequate number of viral copies (<250 copies / mL). A negative result must be combined with clinical observations, patient history, and epidemiological information.  Fact Sheet for Patients:   BoilerBrush.com.cyhttps://www.fda.gov/media/136312/download  Fact Sheet for Healthcare Providers: https://pope.com/https://www.fda.gov/media/136313/download  This test is not yet approved or  cleared by the [place] FDA and has been authorized for detection and/or diagnosis of SARS-CoV-2 by FDA under an Emergency Use Authorization (EUA).  This EUA will remain in effect (meaning this test can be used) for the duration of the COVID-19 declaration under Section 564(b)(1) of the Act, 21 U.S.C. section 360bbb-3(b)(1), unless the authorization is terminated or revoked sooner.  Performed at [facility], [address] [place], [place] [address]   MRSA PCR Screening     Status: None   Collection Time: 04/19/20 12:45 AM   Specimen: Nasopharyngeal  Result Value Ref Range Status   MRSA by PCR NEGATIVE NEGATIVE Final    Comment:        The GeneXpert MRSA Assay (FDA approved for NASAL specimens only), is one component of a comprehensive MRSA colonization surveillance program. It is not intended to diagnose MRSA infection nor to guide or monitor treatment for MRSA infections. Performed at [facility], [address] [place], [place] [address]       Imaging Studies   MR BRAIN WO CONTRAST  Result Date: 04/19/2020 CLINICAL DATA:  Stroke follow-up after tPA EXAM: MRI HEAD WITHOUT CONTRAST TECHNIQUE: Multiplanar, multiecho pulse sequences of the brain and surrounding structures were obtained without intravenous contrast. COMPARISON:  Head CT and CTA from yesterday FINDINGS: Brain: Subcentimeter weakly restricted diffusion at the body of the left putamen. No hemorrhage, hydrocephalus, or masslike finding. FLAIR hyperintensity in the cerebral white matter attributed to chronic small vessel ischemia given history of hypertension. Normal brain volume. Vascular: Normal flow voids. Skull and upper cervical spine: Normal marrow signal Sinuses/Orbits: Negative. IMPRESSION: 1. 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 2. Presumed chronic small vessel ischemia in the cerebral white matter. Electronically Signed   By: Marnee SpringJonathon  Watts M.D.   On: 04/19/2020 12:03   ECHOCARDIOGRAM COMPLETE BUBBLE STUDY  Result Date: 04/19/2020    ECHOCARDIOGRAM REPORT   Patient Name:   Lindsay Guzman Date of Exam: 04/19/2020 Medical Rec #:  [id number]       Height:       65.0 in Accession #:    [id number]      Weight:       164.2 lb Date of Birth:  02/10/1966        BSA:          1.819 m Patient Age:    54 years        BP:           154/119 mmHg Patient  Gender: F               HR:           61 bpm. Exam Location:  [facility]  Procedure: 2D Echo, Cardiac Doppler, Color Doppler and Saline Contrast Bubble            Study Indications:     Stroke 434.91  History:         Patient has no prior history of Echocardiogram examinations.                  Risk Factors:Hypertension.  Sonographer:     Cristela Blue RDCS (AE) Referring Phys:  [id number] LESLIE REYNOLDS Diagnosing Phys: Harold Hedge MD IMPRESSIONS  1. Left ventricular ejection fraction, by estimation, is 65 to 70%. The left ventricle has normal function. The left ventricle has no regional wall motion abnormalities. There is mild left ventricular hypertrophy. Left ventricular diastolic parameters are consistent with Grade I diastolic dysfunction (impaired relaxation).  2. Right ventricular systolic function is normal. The right ventricular size is normal. There is normal pulmonary artery systolic pressure.  3. The mitral valve is grossly normal. Trivial mitral valve regurgitation.  4. The aortic valve is grossly normal. Aortic valve regurgitation is not visualized.  5. Agitated saline contrast bubble study was negative, with no evidence of any interatrial shunt. FINDINGS  Left Ventricle: Left ventricular ejection fraction, by estimation, is 65 to 70%. The left ventricle has normal function. The left ventricle has no regional wall motion abnormalities. The left ventricular internal cavity size was normal in size. There is  mild left ventricular hypertrophy. Left ventricular diastolic parameters are consistent with Grade I diastolic dysfunction (impaired relaxation). Right Ventricle: The right ventricular size is normal. No increase in right ventricular wall thickness. Right ventricular systolic function is normal. There is normal pulmonary artery systolic pressure. The tricuspid regurgitant velocity is 1.81 m/s, and  with an assumed right atrial pressure of 10 mmHg, the estimated right ventricular systolic pressure is 23.1 mmHg. Left Atrium: Left atrial size was normal in size. Right Atrium: Right  atrial size was normal in size. Pericardium: There is no evidence of pericardial effusion. Mitral Valve: The mitral valve is grossly normal. Trivial mitral valve regurgitation. Tricuspid Valve: The tricuspid valve is not well visualized. Tricuspid valve regurgitation is trivial. Aortic Valve: The aortic valve is grossly normal. Aortic valve regurgitation is not visualized. Aortic valve mean gradient measures 3.5 mmHg. Aortic valve peak gradient measures 7.1 mmHg. Aortic valve area, by VTI measures 3.15 cm. Pulmonic Valve: The pulmonic valve was not well visualized. Pulmonic valve regurgitation is trivial. Aorta: The aortic root is normal in size and structure. IAS/Shunts: No atrial level shunt detected by color flow Doppler. Agitated saline contrast was given intravenously to evaluate for intracardiac shunting. Agitated saline contrast bubble study was negative, with no evidence of any interatrial shunt.  LEFT VENTRICLE PLAX 2D LVIDd:         4.30 cm  Diastology LVIDs:         2.25 cm  LV e' lateral:   4.57 cm/s LV PW:         1.20 cm  LV E/e' lateral: 14.2 LV IVS:        1.10 cm  LV e' medial:    5.00 cm/s LVOT diam:     2.00 cm  LV E/e' medial:  13.0 LV SV:         72 LV SV Index:   40 LVOT  Area:     3.14 cm  RIGHT VENTRICLE RV Basal diam:  2.77 cm TAPSE (M-mode): 2.6 cm LEFT ATRIUM             Index       RIGHT ATRIUM           Index LA diam:        3.40 cm 1.87 cm/m  RA Area:     17.30 cm LA Vol (A2C):   49.8 ml 27.37 ml/m RA Volume:   48.10 ml  26.44 ml/m LA Vol (A4C):   50.8 ml 27.92 ml/m LA Biplane Vol: 52.1 ml 28.64 ml/m  AORTIC VALVE                   PULMONIC VALVE AV Area (Vmax):    3.05 cm    PV Vmax:        0.55 m/s AV Area (Vmean):   2.99 cm    PV Peak grad:   1.2 mmHg AV Area (VTI):     3.15 cm    RVOT Peak grad: 2 mmHg AV Vmax:           133.00 cm/s AV Vmean:          87.300 cm/s AV VTI:            0.230 m AV Peak Grad:      7.1 mmHg AV Mean Grad:      3.5 mmHg LVOT Vmax:         129.00  cm/s LVOT Vmean:        83.100 cm/s LVOT VTI:          0.230 m LVOT/AV VTI ratio: 1.00  AORTA Ao Root diam: 2.90 cm MITRAL VALVE               TRICUSPID VALVE MV Area (PHT): 3.21 cm    TR Peak grad:   13.1 mmHg MV Decel Time: 236 msec    TR Vmax:        181.00 cm/s MV E velocity: 65.10 cm/s MV A velocity: 96.80 cm/s  SHUNTS MV E/A ratio:  0.67        Systemic VTI:  0.23 m                            Systemic Diam: 2.00 cm Harold Hedge MD Electronically signed by Harold Hedge MD Signature Date/Time: 04/19/2020/4:33:00 PM    Final      Medications   Scheduled Meds: . amLODipine  10 mg Oral Daily  . aspirin EC  81 mg Oral Daily  . atorvastatin  40 mg Oral Daily  . hydrALAZINE  75 mg Oral Q8H  . magnesium oxide  400 mg Oral BID  . triamterene-hydrochlorothiazide  1 tablet Oral Daily   Continuous Infusions:     LOS: 2 days    Time spent: 30 minutes    Pennie Banter, DO [facility]  04/20/2020, 5:40 PM    If 7PM-7AM, please contact night-coverage. How to contact the [facility] Attending or Consulting provider 7A - 7P or covering provider during after hours 7P -7A, for this patient?    1. Check the care team in [facility] and look for a) attending/consulting [facility] provider listed and b) the [facility] team listed 2. Log into [email or web address] and use [place]'s universal password to access. If you do not have the password, please contact the hospital operator. 3. Locate the  [facility] provider you are looking for under [facility] and page to a number that you can be directly reached. 4. If you still have difficulty reaching the provider, please page the [facility] (Director on Call) for the Hospitalists listed on amion for assistance.

## 2020-04-20 NOTE — Consult Note (Signed)
PHARMACY CONSULT NOTE  Pharmacy Consult for Electrolyte Monitoring and Replacement   Recent Labs: Potassium (mmol/L)  Date Value  04/20/2020 3.8   Magnesium (mg/dL)  Date Value  16/38/4665 1.9   Calcium (mg/dL)  Date Value  99/35/7017 8.8 (L)   Albumin (g/dL)  Date Value  79/39/0300 3.6   Phosphorus (mg/dL)  Date Value  92/33/0076 3.0   Sodium (mmol/L)  Date Value  04/20/2020 139    Assessment: Lindsay Guzman is a 54 y.o. female presenting w/ CVA now s/p tPA 04/18/20  Goal of Therapy:  Electrolytes WNL  Plan:  Will order MagOx 400mg  BID x 1 doses.   No additional replacement needed at this time.   , PharmD, BCPS Clinical Pharmacist 04/20/2020 7:41 AM

## 2020-04-21 LAB — MAGNESIUM: Magnesium: 2.1 mg/dL (ref 1.7–2.4)

## 2020-04-21 LAB — POTASSIUM: Potassium: 3.4 mmol/L — ABNORMAL LOW (ref 3.5–5.1)

## 2020-04-21 MED ORDER — ATORVASTATIN CALCIUM 40 MG PO TABS: 40.0000 mg | ORAL_TABLET | Freq: Every day | ORAL | 0 refills | Status: DC

## 2020-04-21 MED ORDER — HYDRALAZINE HCL 100 MG PO TABS: 100.0000 mg | ORAL_TABLET | Freq: Four times a day (QID) | ORAL | 0 refills | Status: DC

## 2020-04-21 MED ORDER — POTASSIUM CHLORIDE CRYS ER 20 MEQ PO TBCR
40.0000 meq | EXTENDED_RELEASE_TABLET | Freq: Once | ORAL | Status: AC
  Administered 2020-04-21: 40 meq via ORAL
  Filled 2020-04-21: qty 2

## 2020-04-21 MED ORDER — CLOPIDOGREL BISULFATE 75 MG PO TABS
75.0000 mg | ORAL_TABLET | Freq: Every day | ORAL | Status: DC
  Administered 2020-04-21: 75 mg via ORAL
  Filled 2020-04-21: qty 1

## 2020-04-21 MED ORDER — LOSARTAN POTASSIUM 50 MG PO TABS: 50.0000 mg | ORAL_TABLET | Freq: Every day | ORAL | 0 refills | Status: DC

## 2020-04-21 MED ORDER — HYDROCHLOROTHIAZIDE 25 MG PO TABS: 25.0000 mg | ORAL_TABLET | Freq: Every day | ORAL | 0 refills | Status: DC

## 2020-04-21 MED ORDER — AMLODIPINE BESYLATE 10 MG PO TABS: 10.0000 mg | ORAL_TABLET | Freq: Every day | ORAL | 0 refills | Status: DC

## 2020-04-21 MED ORDER — HYDRALAZINE HCL 50 MG PO TABS
100.0000 mg | ORAL_TABLET | Freq: Four times a day (QID) | ORAL | Status: DC
  Administered 2020-04-21: 12:00:00 100 mg via ORAL
  Filled 2020-04-21: qty 2

## 2020-04-21 MED ORDER — CLOPIDOGREL BISULFATE 75 MG PO TABS: 75.0000 mg | ORAL_TABLET | Freq: Every day | ORAL | 0 refills | Status: AC

## 2020-04-21 NOTE — Consult Note (Signed)
PHARMACY CONSULT NOTE  Pharmacy Consult for Electrolyte Monitoring and Replacement   Recent Labs: Potassium (mmol/L)  Date Value  04/21/2020 3.4 (L)   Magnesium (mg/dL)  Date Value  16/07/9603 2.1   Calcium (mg/dL)  Date Value  54/06/8118 8.8 (L)   Albumin (g/dL)  Date Value  14/78/2956 3.6   Phosphorus (mg/dL)  Date Value  21/30/8657 3.0   Sodium (mmol/L)  Date Value  04/20/2020 139    Assessment: Lindsay Guzman is a 54 y.o. female presenting w/ CVA now s/p tPA 04/18/20  Goal of Therapy:  Electrolytes WNL  Plan:  Will order KCl x 1 dose PO.   No additional replacement needed at this time.   Gardner Candle, PharmD, BCPS Clinical Pharmacist 04/21/2020 9:21 AM

## 2020-04-21 NOTE — Progress Notes (Signed)
Subjective: No new neurological complaints.  Objective: Current vital signs: BP (!) 154/112   Pulse 73   Temp 97.7 F (36.5 C) (Oral)   Resp 18   Ht 5\' 5"  (1.651 m)   Wt 74.5 kg   SpO2 100%   BMI 27.33 kg/m  Vital signs in last 24 hours: Temp:  [97.7 F (36.5 C)-98.6 F (37 C)] 97.7 F (36.5 C) (07/08 0741) Pulse Rate:  [61-92] 73 (07/08 0846) Resp:  [15-18] 18 (07/08 0741) BP: (154-183)/(96-113) 154/112 (07/08 0846) SpO2:  [99 %-100 %] 100 % (07/08 0741)  Intake/Output from previous day: No intake/output data recorded. Intake/Output this shift: Total I/O In: 120 [P.O.:120] Out: -  Nutritional status:  Diet Order            Diet 2 gram sodium Room service appropriate? Yes; Fluid consistency: Thin  Diet effective now                 Neurologic Exam: Mental Status: Alert, oriented, thought content appropriate.  Speech fluent without evidence of aphasia.  Able to follow 3 step commands without difficulty. Cranial Nerves: II: Discs flat bilaterally; Visual fields grossly normal, pupils equal, round, reactive to light and accommodation III,IV, VI: ptosis not present, extra-ocular motions intact bilaterally V,VII: smile symmetric, facial light touch sensation normal bilaterally VIII: hearing normal bilaterally IX,X: gag reflex present XI: bilateral shoulder shrug XII: midline tongue extension Motor: 5/5 throughout Sensory: Pinprick and light touch intact throughout, bilaterally  Lab Results: Basic Metabolic Panel: Recent Labs  Lab 04/18/20 0913 04/19/20 0857 04/20/20 0438 04/21/20 0827  NA 138 137 139  --   K 3.3* 3.1* 3.8 3.4*  CL 102 100 104  --   CO2 26 24 27   --   GLUCOSE 109* 122* 97  --   BUN 13 16 13   --   CREATININE 0.87 0.91 0.83  --   CALCIUM 8.9 8.8* 8.8*  --   MG  --  2.1 1.9 2.1  PHOS  --   --  3.0  --     Liver Function Tests: Recent Labs  Lab 04/18/20 0913 04/20/20 0438  AST 19  --   ALT 12  --   ALKPHOS 72  --   BILITOT 0.8   --   PROT 7.9  --   ALBUMIN 4.2 3.6   No results for input(s): LIPASE, AMYLASE in the last 168 hours. No results for input(s): AMMONIA in the last 168 hours.  CBC: Recent Labs  Lab 04/18/20 0913 04/19/20 0857 04/20/20 0438  WBC 8.3 8.9 7.0  NEUTROABS 3.8  --   --   HGB 14.4 14.1 13.9  HCT 41.9 41.8 40.1  MCV 82.0 84.1 82.2  PLT 248 246 238    Cardiac Enzymes: No results for input(s): CKTOTAL, CKMB, CKMBINDEX, TROPONINI in the last 168 hours.  Lipid Panel: Recent Labs  Lab 04/20/20 0438  CHOL 195  TRIG 111  HDL 36*  CHOLHDL 5.4  VLDL 22  LDLCALC 06/20/20*    CBG: Recent Labs  Lab 04/18/20 0916 04/19/20 0047  GLUCAP 99 78    Microbiology: Results for orders placed or performed during the hospital encounter of 04/18/20  SARS Coronavirus 2 by RT PCR (hospital order, performed in [facility] hospital lab) Nasopharyngeal Nasopharyngeal Swab     Status: None   Collection Time: 04/18/20 10:55 AM   Specimen: Nasopharyngeal Swab  Result Value Ref Range Status   SARS Coronavirus 2 NEGATIVE NEGATIVE Final  Comment: (NOTE) SARS-CoV-2 target nucleic acids are NOT DETECTED.  The SARS-CoV-2 RNA is generally detectable in upper and lower respiratory specimens during the acute phase of infection. The lowest concentration of SARS-CoV-2 viral copies this assay can detect is 250 copies / mL. A negative result does not preclude SARS-CoV-2 infection and should not be used as the sole basis for treatment or other patient management decisions.  A negative result may occur with improper specimen collection / handling, submission of specimen other than nasopharyngeal swab, presence of viral mutation(s) within the areas targeted by this assay, and inadequate number of viral copies (<250 copies / mL). A negative result must be combined with clinical observations, patient history, and epidemiological information.  Fact Sheet for Patients:    BoilerBrush.com.cy  Fact Sheet for Healthcare Providers: https://pope.com/  This test is not yet approved or  cleared by the [place] FDA and has been authorized for detection and/or diagnosis of SARS-CoV-2 by FDA under an Emergency Use Authorization (EUA).  This EUA will remain in effect (meaning this test can be used) for the duration of the COVID-19 declaration under Section 564(b)(1) of the Act, 21 U.S.C. section 360bbb-3(b)(1), unless the authorization is terminated or revoked sooner.  Performed at [facility], [address] [place], [place] [address]   MRSA PCR Screening     Status: None   Collection Time: 04/19/20 12:45 AM   Specimen: Nasopharyngeal  Result Value Ref Range Status   MRSA by PCR NEGATIVE NEGATIVE Final    Comment:        The GeneXpert MRSA Assay (FDA approved for NASAL specimens only), is one component of a comprehensive MRSA colonization surveillance program. It is not intended to diagnose MRSA infection nor to guide or monitor treatment for MRSA infections. Performed at [facility], [address] [place], [place] [address]     Coagulation Studies: No results for input(s): LABPROT, INR in the last 72 hours.  Imaging: MR BRAIN WO CONTRAST  Result Date: 04/19/2020 CLINICAL DATA:  Stroke follow-up after tPA EXAM: MRI HEAD WITHOUT CONTRAST TECHNIQUE: Multiplanar, multiecho pulse sequences of the brain and surrounding structures were obtained without intravenous contrast. COMPARISON:  Head CT and CTA from yesterday FINDINGS: Brain: Subcentimeter weakly restricted diffusion at the body of the left putamen. No hemorrhage, hydrocephalus, or masslike finding. FLAIR hyperintensity in the cerebral white matter attributed to chronic small vessel ischemia given history of hypertension. Normal brain volume. Vascular: Normal flow voids. Skull and upper cervical spine: Normal marrow signal  Sinuses/Orbits: Negative. IMPRESSION: 1. 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 2. Presumed chronic small vessel ischemia in the cerebral white matter. Electronically Signed   By: Marnee Spring M.D.   On: 04/19/2020 12:03   ECHOCARDIOGRAM COMPLETE BUBBLE STUDY  Result Date: 04/19/2020    ECHOCARDIOGRAM REPORT   Patient Name:   Lindsay Guzman [facility] Date of Exam: 04/19/2020 Medical Rec #:  [id number]       Height:       65.0 in Accession #:    [id number]      Weight:       164.2 lb Date of Birth:  08-14-1966        BSA:          1.819 m Patient Age:    54 years        BP:           154/119 mmHg Patient Gender: F  HR:           61 bpm. Exam Location:  [facility] Procedure: 2D Echo, Cardiac Doppler, Color Doppler and Saline Contrast Bubble            Study Indications:     Stroke 434.91  History:         Patient has no prior history of Echocardiogram examinations.                  Risk Factors:Hypertension.  Sonographer:     Cristela BlueJerry Hege RDCS (AE) Referring Phys:  [id number] Terre Zabriskie Diagnosing Phys: Harold HedgeKenneth Fath MD IMPRESSIONS  1. Left ventricular ejection fraction, by estimation, is 65 to 70%. The left ventricle has normal function. The left ventricle has no regional wall motion abnormalities. There is mild left ventricular hypertrophy. Left ventricular diastolic parameters are consistent with Grade I diastolic dysfunction (impaired relaxation).  2. Right ventricular systolic function is normal. The right ventricular size is normal. There is normal pulmonary artery systolic pressure.  3. The mitral valve is grossly normal. Trivial mitral valve regurgitation.  4. The aortic valve is grossly normal. Aortic valve regurgitation is not visualized.  5. Agitated saline contrast bubble study was negative, with no evidence of any interatrial shunt. FINDINGS  Left Ventricle: Left ventricular ejection fraction, by estimation, is 65 to 70%. The left ventricle has normal function. The left ventricle has no regional  wall motion abnormalities. The left ventricular internal cavity size was normal in size. There is  mild left ventricular hypertrophy. Left ventricular diastolic parameters are consistent with Grade I diastolic dysfunction (impaired relaxation). Right Ventricle: The right ventricular size is normal. No increase in right ventricular wall thickness. Right ventricular systolic function is normal. There is normal pulmonary artery systolic pressure. The tricuspid regurgitant velocity is 1.81 m/s, and  with an assumed right atrial pressure of 10 mmHg, the estimated right ventricular systolic pressure is 23.1 mmHg. Left Atrium: Left atrial size was normal in size. Right Atrium: Right atrial size was normal in size. Pericardium: There is no evidence of pericardial effusion. Mitral Valve: The mitral valve is grossly normal. Trivial mitral valve regurgitation. Tricuspid Valve: The tricuspid valve is not well visualized. Tricuspid valve regurgitation is trivial. Aortic Valve: The aortic valve is grossly normal. Aortic valve regurgitation is not visualized. Aortic valve mean gradient measures 3.5 mmHg. Aortic valve peak gradient measures 7.1 mmHg. Aortic valve area, by VTI measures 3.15 cm. Pulmonic Valve: The pulmonic valve was not well visualized. Pulmonic valve regurgitation is trivial. Aorta: The aortic root is normal in size and structure. IAS/Shunts: No atrial level shunt detected by color flow Doppler. Agitated saline contrast was given intravenously to evaluate for intracardiac shunting. Agitated saline contrast bubble study was negative, with no evidence of any interatrial shunt.  LEFT VENTRICLE PLAX 2D LVIDd:         4.30 cm  Diastology LVIDs:         2.25 cm  LV e' lateral:   4.57 cm/s LV PW:         1.20 cm  LV E/e' lateral: 14.2 LV IVS:        1.10 cm  LV e' medial:    5.00 cm/s LVOT diam:     2.00 cm  LV E/e' medial:  13.0 LV SV:         72 LV SV Index:   40 LVOT Area:     3.14 cm  RIGHT VENTRICLE RV Basal diam:   2.77 cm  TAPSE (M-mode): 2.6 cm LEFT ATRIUM             Index       RIGHT ATRIUM           Index LA diam:        3.40 cm 1.87 cm/m  RA Area:     17.30 cm LA Vol (A2C):   49.8 ml 27.37 ml/m RA Volume:   48.10 ml  26.44 ml/m LA Vol (A4C):   50.8 ml 27.92 ml/m LA Biplane Vol: 52.1 ml 28.64 ml/m  AORTIC VALVE                   PULMONIC VALVE AV Area (Vmax):    3.05 cm    PV Vmax:        0.55 m/s AV Area (Vmean):   2.99 cm    PV Peak grad:   1.2 mmHg AV Area (VTI):     3.15 cm    RVOT Peak grad: 2 mmHg AV Vmax:           133.00 cm/s AV Vmean:          87.300 cm/s AV VTI:            0.230 m AV Peak Grad:      7.1 mmHg AV Mean Grad:      3.5 mmHg LVOT Vmax:         129.00 cm/s LVOT Vmean:        83.100 cm/s LVOT VTI:          0.230 m LVOT/AV VTI ratio: 1.00  AORTA Ao Root diam: 2.90 cm MITRAL VALVE               TRICUSPID VALVE MV Area (PHT): 3.21 cm    TR Peak grad:   13.1 mmHg MV Decel Time: 236 msec    TR Vmax:        181.00 cm/s MV E velocity: 65.10 cm/s MV A velocity: 96.80 cm/s  SHUNTS MV E/A ratio:  0.67        Systemic VTI:  0.23 m                            Systemic Diam: 2.00 cm Harold Hedge MD Electronically signed by Harold Hedge MD Signature Date/Time: 04/19/2020/4:33:00 PM    Final     Medications:  I have reviewed the patient's current medications. Scheduled: . amLODipine  10 mg Oral Daily  . aspirin EC  81 mg Oral Daily  . atorvastatin  40 mg Oral Daily  . hydrALAZINE  100 mg Oral Q6H  . hydrochlorothiazide  25 mg Oral Daily  . losartan  50 mg Oral Daily    Assessment/Plan: 54 y.o.femalewith a history of tobacco abuse and HTN (noncompliant with medications) who presentedwith right sided hemiparesis that started after awakening. Initial NIHSS of 7. Head CT personally reviewed and showedno acute changes. Once target BP achieved, tPA administered.  Reports being at baseline today. NIHSS of 0. MRI of the brain personally reviewed and shows an acute left caudate infarct.  Etiology likely small vessel disease. CTA of the head and neck shows no hemodynamically significant stenosis. Echocardiogram shows no cardiac source of emboli or PFO with an EF of 65-70%.  A1c 5.6, LDL 137.  BP remains difficult to control.    Stroke Risk Factors -hypertension and smoking  Plan: 1. Statin for lipid management with target LDL<70. 2.  Dual antiplatelet therapy with ASA 81mg  and Plavix 75mg  for three weeks with change to ASA 81mg  daily alone as monotherapy after that time. 3. Telemetry monitoring 4. Frequent neuro checks 5. BP control 6. Smoking cessation counseling   LOS: 3 days   , MD Neurology [phone] 04/21/2020  10:38 AM

## 2020-04-21 NOTE — TOC Initial Note (Signed)
Transition of Care [facility]) - Initial/Assessment Note    Patient Details  Name: Lindsay Guzman MRN: [id number] Date of Birth: 1966-01-29  [facility] CM/SW Contact:    Allayne Butcher, RN Phone Number: 04/21/2020, 12:32 PM  Clinical Narrative:                 Patient is medically cleared for discharge today.  Patient is from home with her husband and independent in ADL's.  Patient will need to follow up in a week with primary care.  Unable to get patient scheduled within a week but patient can follow up with [facility] for hospital follow up.  Patient has a new patient appointment with Dr. Lucillie Garfinkel at [facility] for July 30th at 9:45 am.    Expected Discharge Plan: Home/Self Care Barriers to Discharge: Barriers Resolved   Patient Goals and CMS Choice        Expected Discharge Plan and Services Expected Discharge Plan: Home/Self Care   Discharge Planning Services: CM Consult, Follow-up appt scheduled     Expected Discharge Date: 04/21/20                                    Prior Living Arrangements/Services   Lives with:: Spouse                   Activities of Daily Living Home Assistive Devices/Equipment: None ADL Screening (condition at time of admission) Patient's cognitive ability adequate to safely complete daily activities?: Yes Is the patient deaf or have difficulty hearing?: No Does the patient have difficulty seeing, even when wearing glasses/contacts?: No Does the patient have difficulty concentrating, remembering, or making decisions?: No Patient able to express need for assistance with ADLs?: Yes Does the patient have difficulty dressing or bathing?: No Independently performs ADLs?: Yes (appropriate for developmental age) Does the patient have difficulty walking or climbing stairs?: No Weakness of Legs: None Weakness of Arms/Hands: None  Permission Sought/Granted                  Emotional  Assessment Appearance:: Appears stated age Attitude/Demeanor/Rapport: Engaged Affect (typically observed): Accepting Orientation: : Oriented to Self, Oriented to Place, Oriented to  Time, Oriented to Situation Alcohol / Substance Use: Not Applicable Psych Involvement: No (comment)  Admission diagnosis:  CVA (cerebral vascular accident) (HCC) [I63.9] Acute ischemic stroke [facility]) [I63.9] Patient Active Problem List   Diagnosis Date Noted  . Essential hypertension 04/20/2020  . Acute ischemic stroke (HCC) 04/18/2020   PCP:  Patient, No Pcp Per Pharmacy:   [facility] [address] ([address] [place] - [address] [place] ([place]) [place] [address] Phone: [phone] Fax: [phone]  [facility] [address] ([address]), [place] - [address] [place]) [place] [address] Phone: [phone] Fax: [phone]     Social Determinants of Health (SDOH) Interventions    Readmission Risk Interventions No flowsheet data found.

## 2020-04-22 ENCOUNTER — Other Ambulatory Visit: Payer: Self-pay | Admitting: Internal Medicine

## 2020-04-23 NOTE — Discharge Summary (Signed)
5        [place] at [facility]   PATIENT NAME: Lindsay Guzman    MR#:  [id number]  DATE OF BIRTH:  10-20-1965  DATE OF ADMISSION:  04/18/2020   ADMITTING PHYSICIAN: Vida Rigger, MD  DATE OF DISCHARGE: 04/21/2020  1:40 PM  PRIMARY CARE PHYSICIAN: Patient, No Pcp Per   ADMISSION DIAGNOSIS:  CVA (cerebral vascular accident) (HCC) [I63.9] Acute ischemic stroke (HCC) [I63.9] DISCHARGE DIAGNOSIS:  Active Problems:   Acute ischemic stroke [facility])   Essential hypertension  SECONDARY DIAGNOSIS:   Past Medical History:  Diagnosis Date  . Hypertension    HOSPITAL COURSE:  54 year old female with a known history of hypertension, tobacco abuse was admitted for right-sided hemiparesis facial droop  Acute stroke MRI showing acute left caudate infarct.  NIHSS of 7 present on admission status post successful TPA with improvement in NIHSS to 0 CT angio head and neck showing no hemodynamically significant stenosis Echo showed no cardiac source of emboli or PFO, EF 65 to 70% Patient was evaluated by PT OT and no skilled need identified.  Back to her baseline. Neurology recommends aspirin and Plavix for 3 weeks followed by aspirin monotherapy indefinitely Continue high intensity statin Stroke education provided Strongly advised against smoking   DISCHARGE CONDITIONS:  Stable CONSULTS OBTAINED:  Treatment Team:  Kym Groom, MD DRUG ALLERGIES:   Allergies  Allergen Reactions  . Other Anaphylaxis and Swelling    Nuts   . Penicillins Hives   DISCHARGE MEDICATIONS:   Allergies as of 04/21/2020      Reactions   Other Anaphylaxis, Swelling   Nuts   Penicillins Hives      Medication List    TAKE these medications   amLODipine 10 MG tablet Commonly known as: NORVASC Take 1 tablet (10 mg total) by mouth daily.   aspirin EC 81 MG tablet Take 81 mg by mouth daily. Swallow whole.   atorvastatin 40 MG tablet Commonly known as: LIPITOR Take 1 tablet (40 mg  total) by mouth daily.   clopidogrel 75 MG tablet Commonly known as: PLAVIX Take 1 tablet (75 mg total) by mouth daily.   hydrALAZINE 100 MG tablet Commonly known as: APRESOLINE Take 1 tablet (100 mg total) by mouth every 6 (six) hours.   hydrochlorothiazide 25 MG tablet Commonly known as: HYDRODIURIL Take 1 tablet (25 mg total) by mouth daily.   losartan 50 MG tablet Commonly known as: COZAAR Take 1 tablet (50 mg total) by mouth daily.      DISCHARGE INSTRUCTIONS:   DIET:  Low fat, Low cholesterol diet DISCHARGE CONDITION:  Good ACTIVITY:  Activity as tolerated OXYGEN:  Home Oxygen: No.  Oxygen Delivery: room air DISCHARGE LOCATION:  home   If you experience worsening of your admission symptoms, develop shortness of breath, life threatening emergency, suicidal or homicidal thoughts you must seek medical attention immediately by calling 911 or calling your MD immediately  if symptoms less severe.  You Must read complete instructions/literature along with all the possible adverse reactions/side effects for all the Medicines you take and that have been prescribed to you. Take any new Medicines after you have completely understood and accpet all the possible adverse reactions/side effects.   Please note  You were cared for by a hospitalist during your hospital stay. If you have any questions about your discharge medications or the care you received while you were in the hospital after you are discharged, you can call the unit and asked to  speak with the hospitalist on call if the hospitalist that took care of you is not available. Once you are discharged, your primary care physician will handle any further medical issues. Please note that NO REFILLS for any discharge medications will be authorized once you are discharged, as it is imperative that you return to your primary care physician (or establish a relationship with a primary care physician if you do not have one) for your  aftercare needs so that they can reassess your need for medications and monitor your lab values.    On the day of Discharge:  VITAL SIGNS:  Blood pressure (!) 162/97, pulse 79, temperature 98.2 F (36.8 C), temperature source Oral, resp. rate 17, height 5\' 5"  (1.651 m), weight 74.5 kg, SpO2 100 %. PHYSICAL EXAMINATION:  GENERAL:  54 y.o.-year-old patient lying in the bed with no acute distress.  EYES: Pupils equal, round, reactive to light and accommodation. No scleral icterus. Extraocular muscles intact.  HEENT: Head atraumatic, normocephalic. Oropharynx and nasopharynx clear.  NECK:  Supple, no jugular venous distention. No thyroid enlargement, no tenderness.  LUNGS: Normal breath sounds bilaterally, no wheezing, rales,rhonchi or crepitation. No use of accessory muscles of respiration.  CARDIOVASCULAR: S1, S2 normal. No murmurs, rubs, or gallops.  ABDOMEN: Soft, non-tender, non-distended. Bowel sounds present. No organomegaly or mass.  EXTREMITIES: No pedal edema, cyanosis, or clubbing.  NEUROLOGIC: Cranial nerves II through XII are intact. Muscle strength 5/5 in all extremities. Sensation intact. Gait not checked.  PSYCHIATRIC: The patient is alert and oriented x 3.  SKIN: No obvious rash, lesion, or ulcer.  DATA REVIEW:   CBC Recent Labs  Lab 04/20/20 0438  WBC 7.0  HGB 13.9  HCT 40.1  PLT 238    Chemistries  Recent Labs  Lab 04/18/20 0913 04/19/20 0857 04/20/20 0438 04/20/20 0438 04/21/20 0827  NA 138   < > 139  --   --   K 3.3*   < > 3.8   < > 3.4*  CL 102   < > 104  --   --   CO2 26   < > 27  --   --   GLUCOSE 109*   < > 97  --   --   BUN 13   < > 13  --   --   CREATININE 0.87   < > 0.83  --   --   CALCIUM 8.9   < > 8.8*  --   --   MG  --    < > 1.9   < > 2.1  AST 19  --   --   --   --   ALT 12  --   --   --   --   ALKPHOS 72  --   --   --   --   BILITOT 0.8  --   --   --   --    < > = values in this interval not displayed.     Outpatient follow-up   Follow-up Information    06/22/20, MD. Go on 05/13/2020.   Specialty: Internal Medicine Why: at 9:45 a.m. Contact information: [address] [place] [phone]        [phone], MD.   Specialty: Neurology Why: Office will call patient with appointment Contact information: [phone] [facility] [address] [facility] [place] [phone]        [facility] Go in 1 week(s).  Why: Follow up in 1 week with the [facility] in [facility]- PCP appointment is not until July 30th.  Contact information: [address] [place] [address] [phone]                Management plans discussed with the patient, family and they are in agreement.  CODE STATUS: Prior   TOTAL TIME TAKING CARE OF THIS PATIENT: 45 minutes.     Delfino Lovett M.D on 04/23/2020 at 4:35 PM  [facility]   CC: Primary care physician; Patient, No Pcp Per   Note: This dictation was prepared with Dragon dictation along with smaller phrase technology. Any transcriptional errors that result from this process are unintentional.

## 2020-05-16 ENCOUNTER — Other Ambulatory Visit: Payer: Self-pay | Admitting: Internal Medicine

## 2020-05-16 ENCOUNTER — Other Ambulatory Visit: Payer: Self-pay | Admitting: Ophthalmology

## 2020-05-16 DIAGNOSIS — N6320 Unspecified lump in the left breast, unspecified quadrant: Secondary | ICD-10-CM

## 2020-05-16 DIAGNOSIS — Z1231 Encounter for screening mammogram for malignant neoplasm of breast: Secondary | ICD-10-CM

## 2020-06-09 ENCOUNTER — Other Ambulatory Visit: Payer: Self-pay | Admitting: Internal Medicine

## 2020-06-09 ENCOUNTER — Ambulatory Visit
Admission: RE | Admit: 2020-06-09 | Discharge: 2020-06-09 | Disposition: A | Discharge status: Home / Self Care | Payer: PRIVATE HEALTH INSURANCE | Source: Ambulatory Visit | Attending: Internal Medicine | Admitting: Internal Medicine

## 2020-06-09 ENCOUNTER — Other Ambulatory Visit: Payer: Self-pay

## 2020-06-09 DIAGNOSIS — R928 Other abnormal and inconclusive findings on diagnostic imaging of breast: Secondary | ICD-10-CM

## 2020-06-09 DIAGNOSIS — N6320 Unspecified lump in the left breast, unspecified quadrant: Secondary | ICD-10-CM | POA: Diagnosis present

## 2020-06-09 DIAGNOSIS — Z1231 Encounter for screening mammogram for malignant neoplasm of breast: Secondary | ICD-10-CM | POA: Diagnosis not present

## 2020-06-09 DIAGNOSIS — R921 Mammographic calcification found on diagnostic imaging of breast: Secondary | ICD-10-CM

## 2020-06-09 IMAGING — MG DIGITAL DIAGNOSTIC BILAT W/ TOMO W/ CAD
8 of 17 series · 8 of 40 positions shown · non-contrast
Comparison: Previous exam(s).

CLINICAL DATA: The patient was last seen in [6A]. At that time,
biopsy of a right breast mass was recommended. Biopsy of a left
breast mass was also recommended. The patient had a left breast
biopsy but not the right.

EXAM:
DIGITAL DIAGNOSTIC BILATERAL MAMMOGRAM WITH TOMO AND CAD

[L ML]
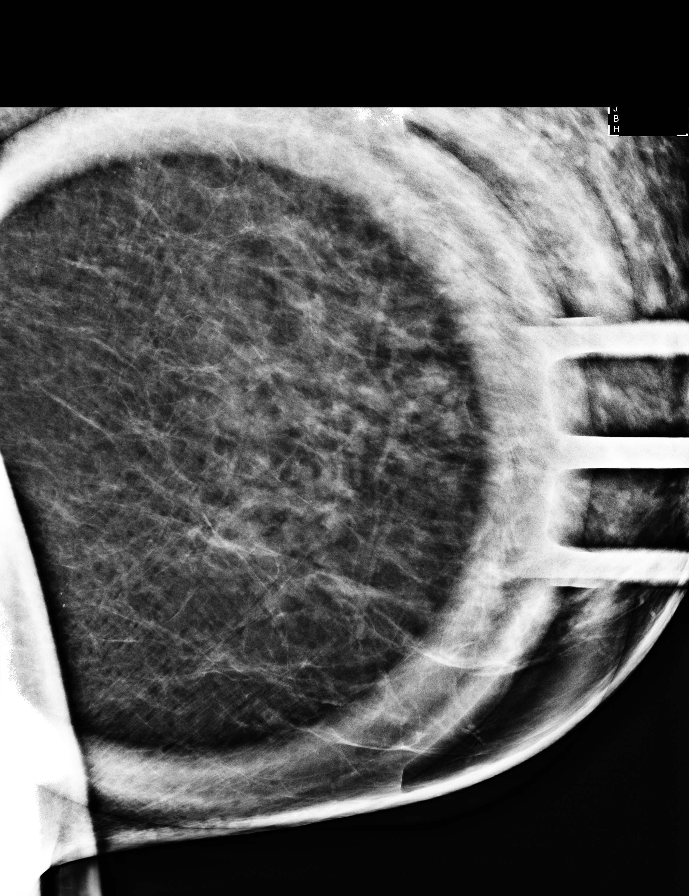

[L CC]
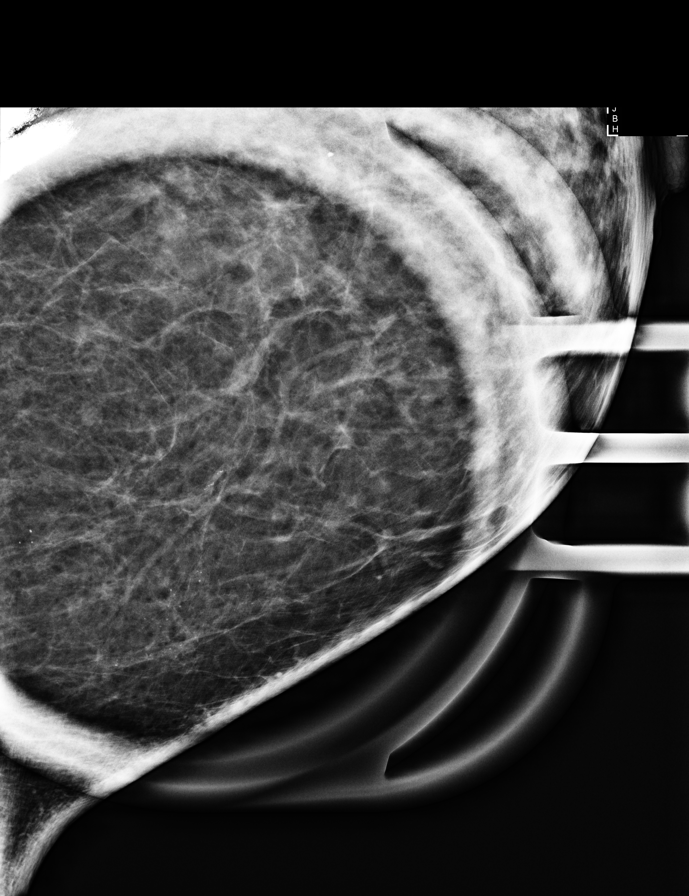

[R CC synth-2D (1 of 2)]
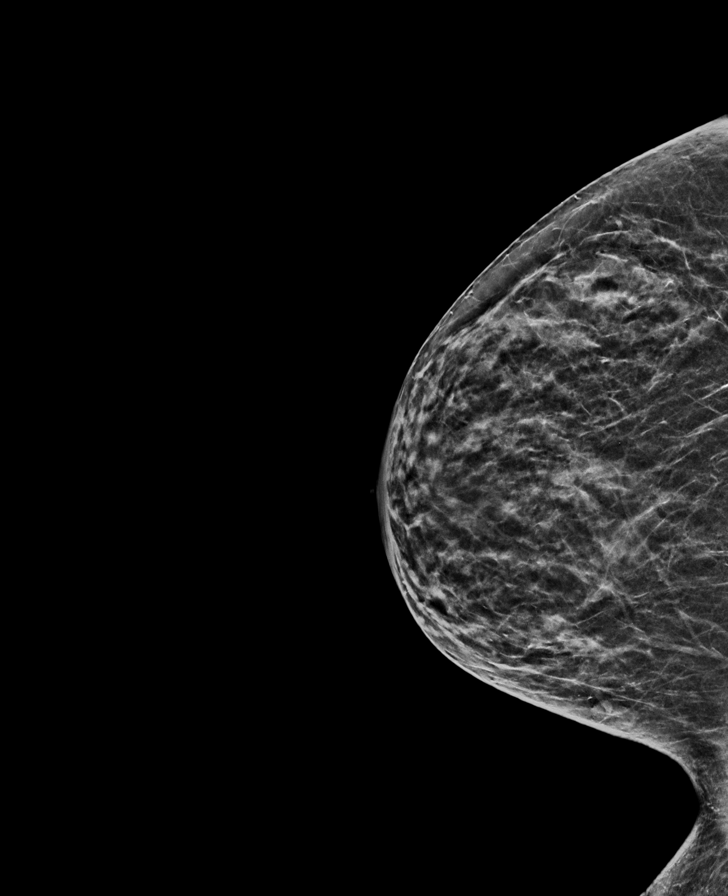

[L CC synth-2D (1 of 2)]
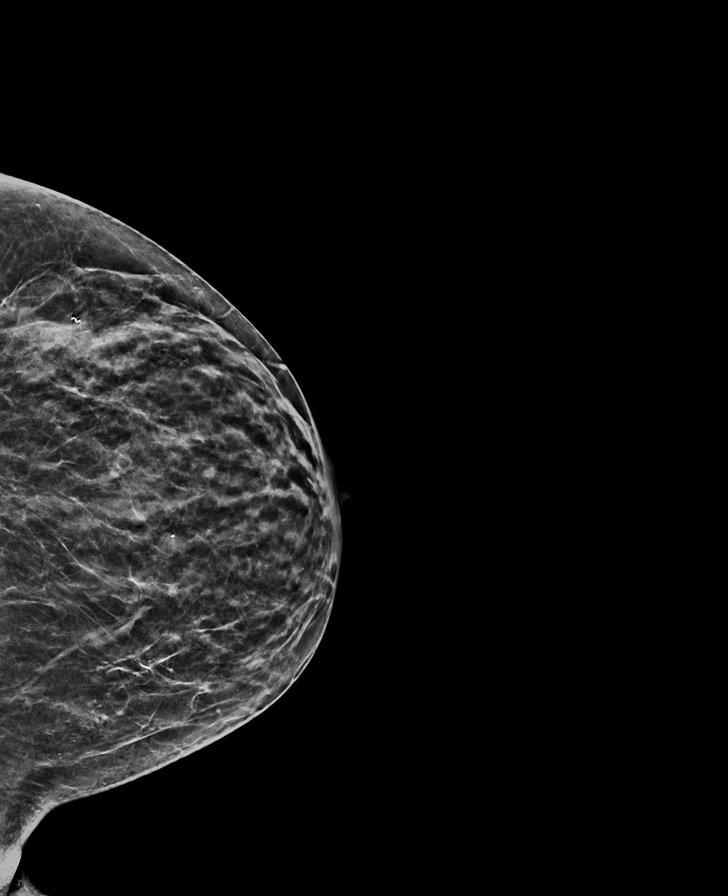

[L MLO synth-2D (1 of 2)]
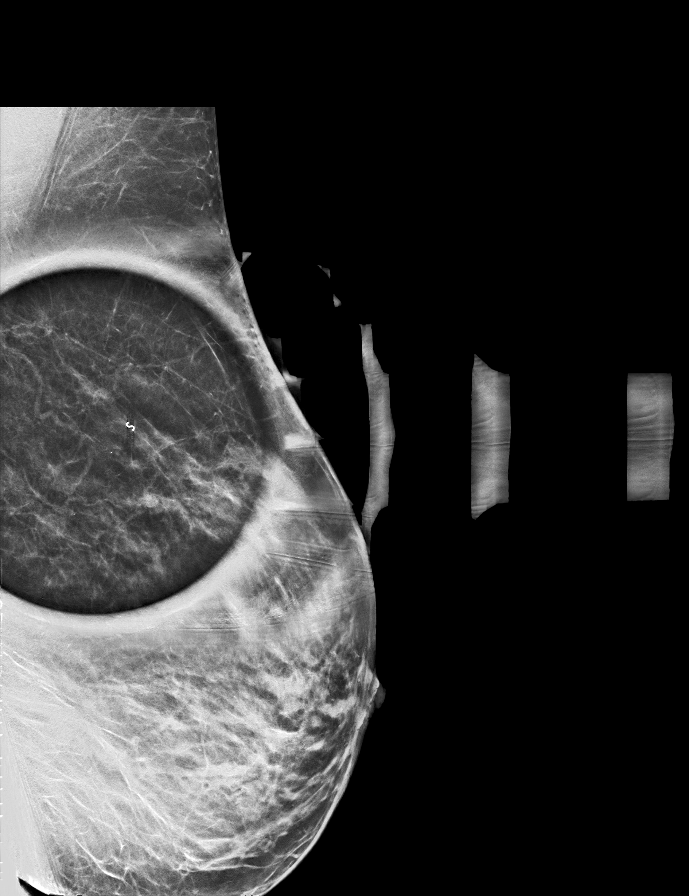

[L CC synth-2D (2 of 2)]
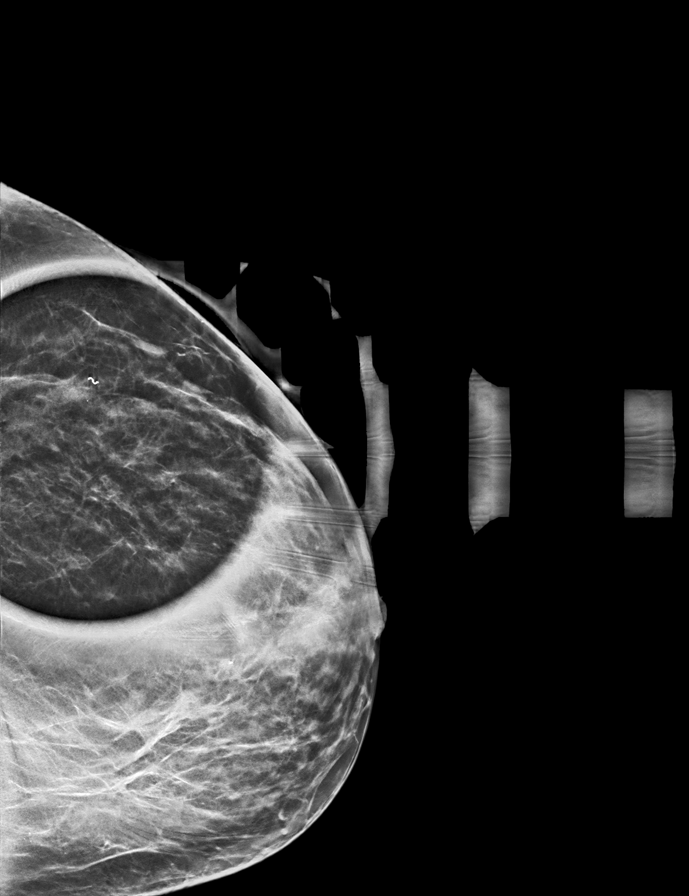

[R CC synth-2D (2 of 2)]
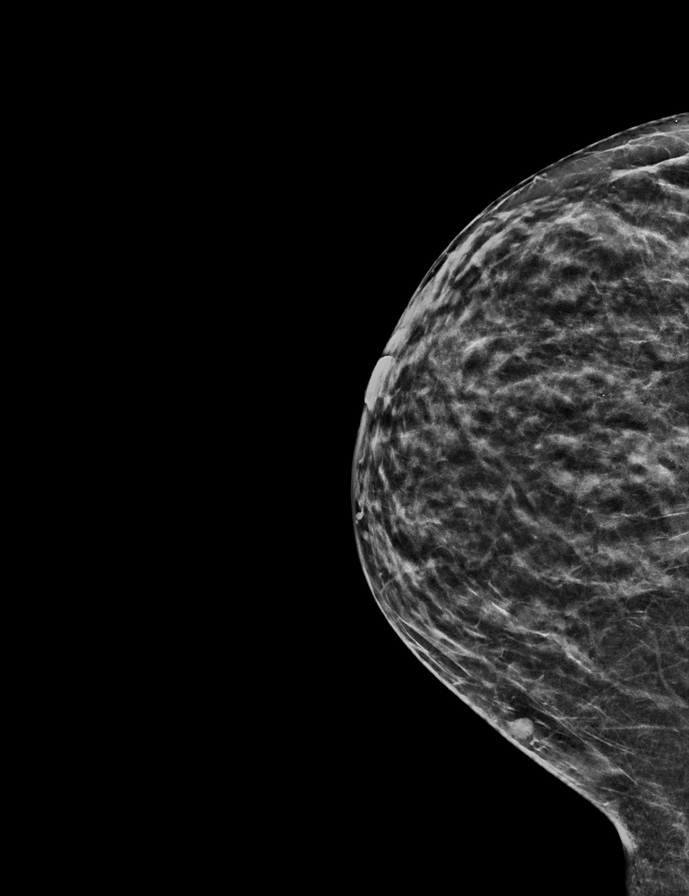

[L MLO synth-2D (2 of 2)]
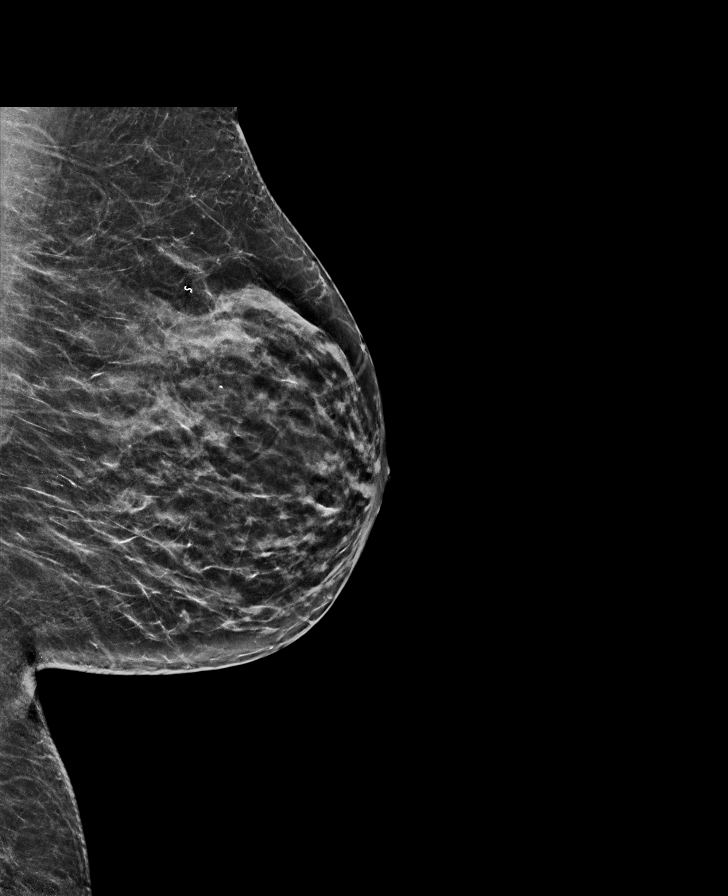

[8 of 40 positions shown; findings below may reference images not displayed]

ACR Breast Density Category b: There are scattered areas of
fibroglandular density.
FINDINGS: There is a new group of calcifications in the medial central left
breast spanning 2.3 cm. The mass in the medial right breast is been
stable since [6A] requiring no additional follow-up. The site of
biopsy on the left has also been stable. No other new suspicious
findings in either breast.

Mammographic images were processed with CAD.
IMPRESSION: Indeterminate medial left breast calcifications spanning 2.3 cm. No
other suspicious findings. Stable benign mass in the medial right
breast.

RECOMMENDATION:
Recommend stereotactic biopsy of the left breast calcifications.
Given the span of 2.3 cm, I would biopsy 1 site within the
calcifications. If the biopsy is malignant, recommend bracketing the
calcifications for surgery.

I have discussed the findings and recommendations with the patient.
If applicable, a reminder letter will be sent to the patient
regarding the next appointment.

BI-RADS CATEGORY  4: Suspicious.

## 2020-06-17 ENCOUNTER — Other Ambulatory Visit: Payer: Self-pay | Admitting: Internal Medicine

## 2020-06-17 ENCOUNTER — Ambulatory Visit
Admission: RE | Admit: 2020-06-17 | Discharge: 2020-06-17 | Disposition: A | Discharge status: Home / Self Care | Payer: PRIVATE HEALTH INSURANCE | Source: Ambulatory Visit | Attending: Internal Medicine | Admitting: Internal Medicine

## 2020-06-17 ENCOUNTER — Other Ambulatory Visit: Payer: Self-pay

## 2020-06-17 DIAGNOSIS — R921 Mammographic calcification found on diagnostic imaging of breast: Secondary | ICD-10-CM | POA: Diagnosis present

## 2020-06-17 DIAGNOSIS — R928 Other abnormal and inconclusive findings on diagnostic imaging of breast: Secondary | ICD-10-CM

## 2020-06-17 HISTORY — PX: BREAST BIOPSY: SHX20

## 2020-06-17 IMAGING — MG MM BREAST BX W LOC DEV 1ST LESION IMAGE BX SPEC STEREO GUIDE*L*
7 of 15 series · 7 of 27 positions shown · non-contrast
Comparison: Previous exams.
COMPARISON: Previous exams.

Addendum:
CLINICAL DATA: Patient with indeterminate calcifications within the
inner LEFT breast presents today for stereotactic biopsy.

EXAM:
LEFT BREAST STEREOTACTIC CORE NEEDLE BIOPSY

[L (1 of 7)]
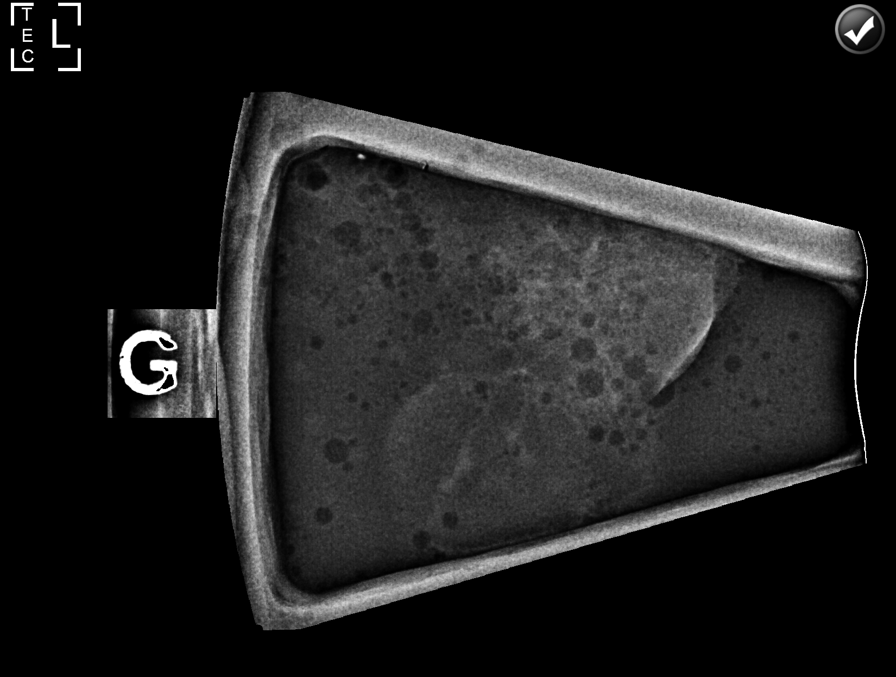

[L (2 of 7)]
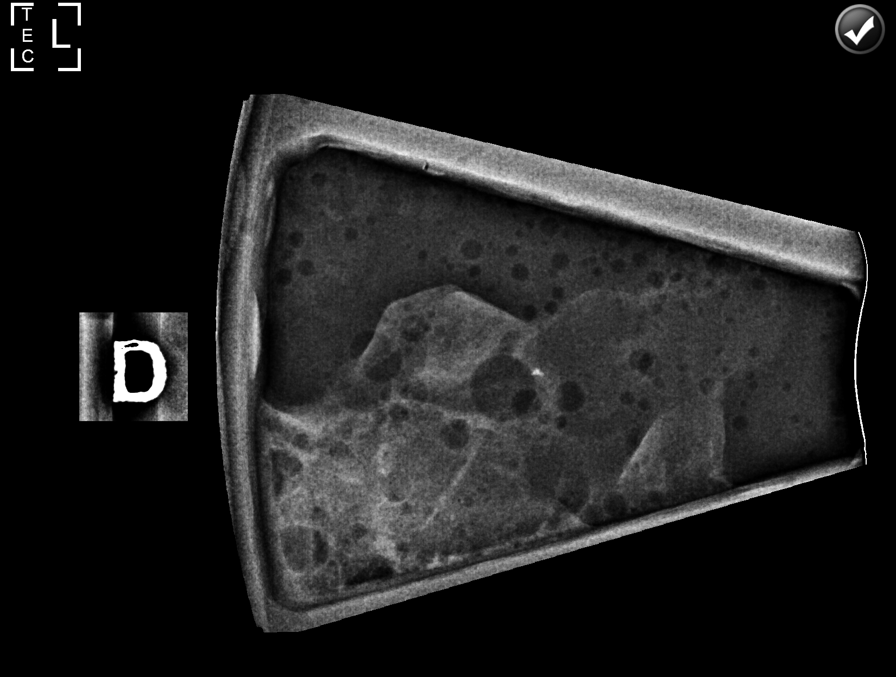

[L (3 of 7)]
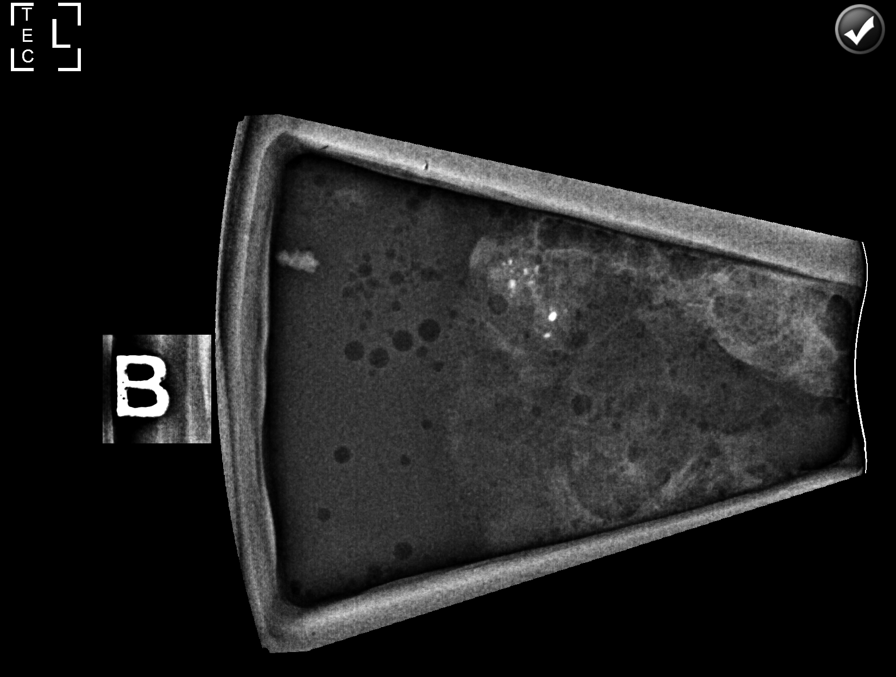

[L (4 of 7)]
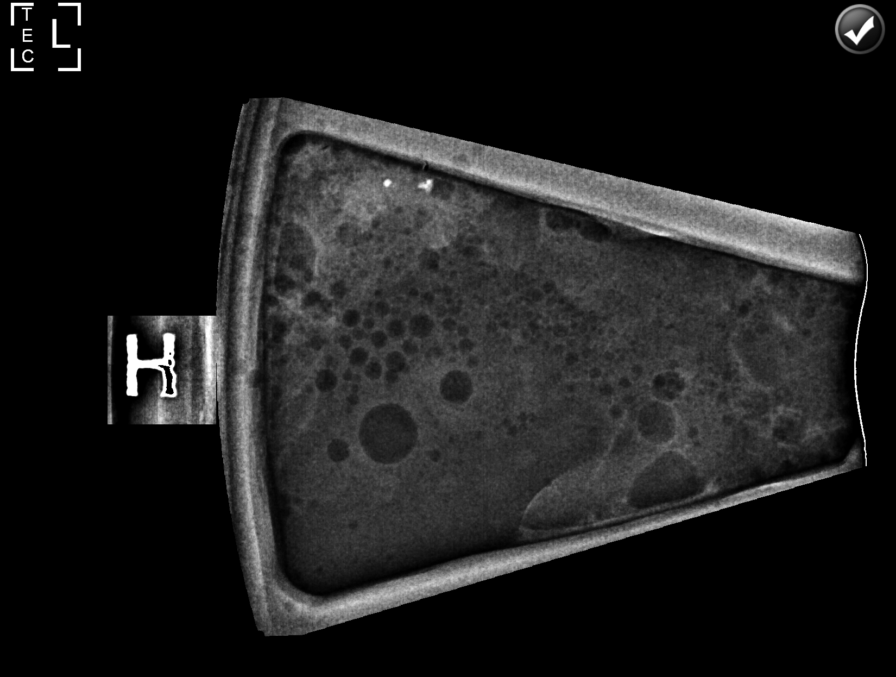

[L (5 of 7)]
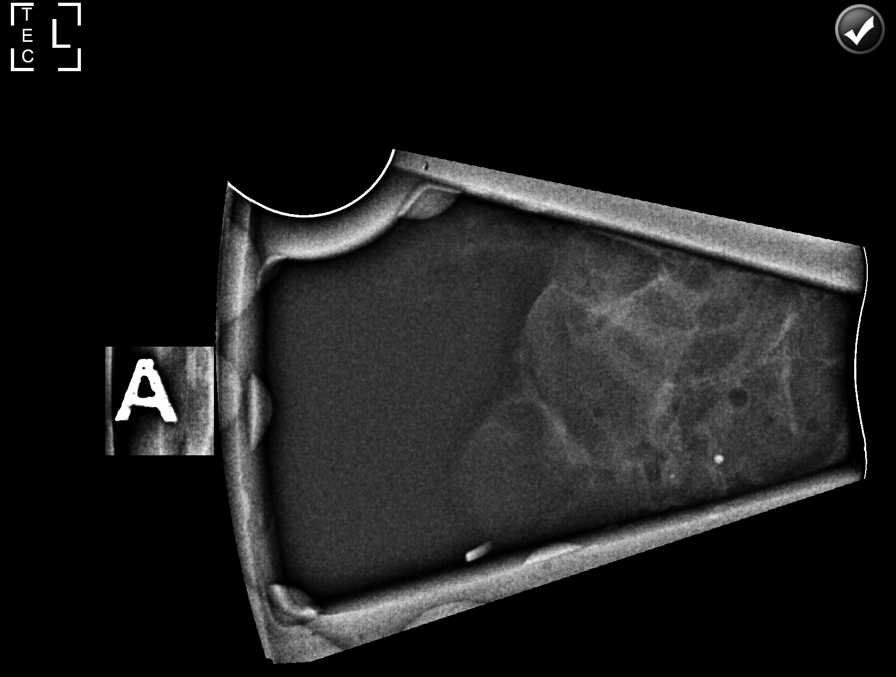

[L (6 of 7)]
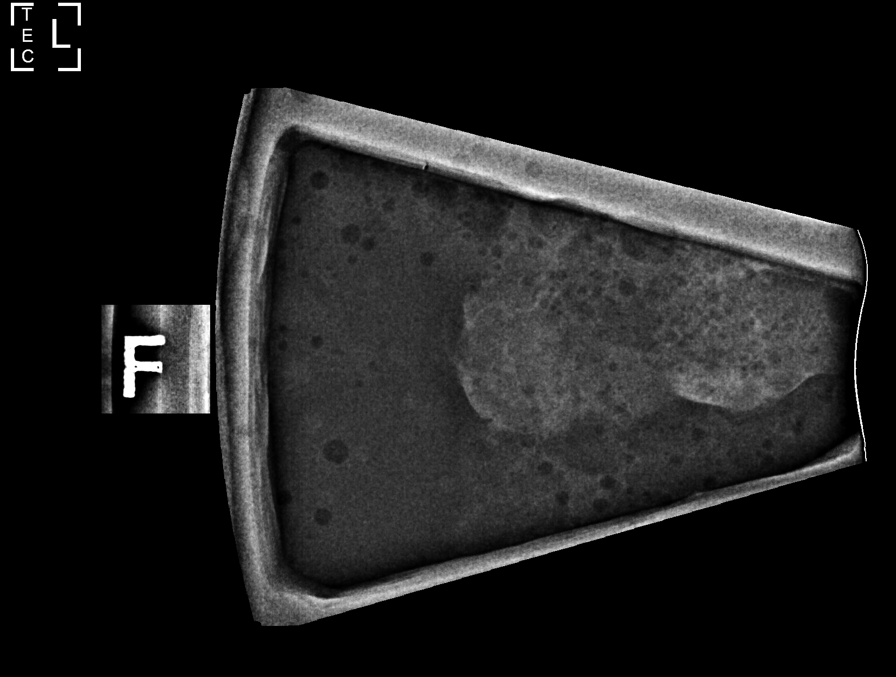

[L (7 of 7)]
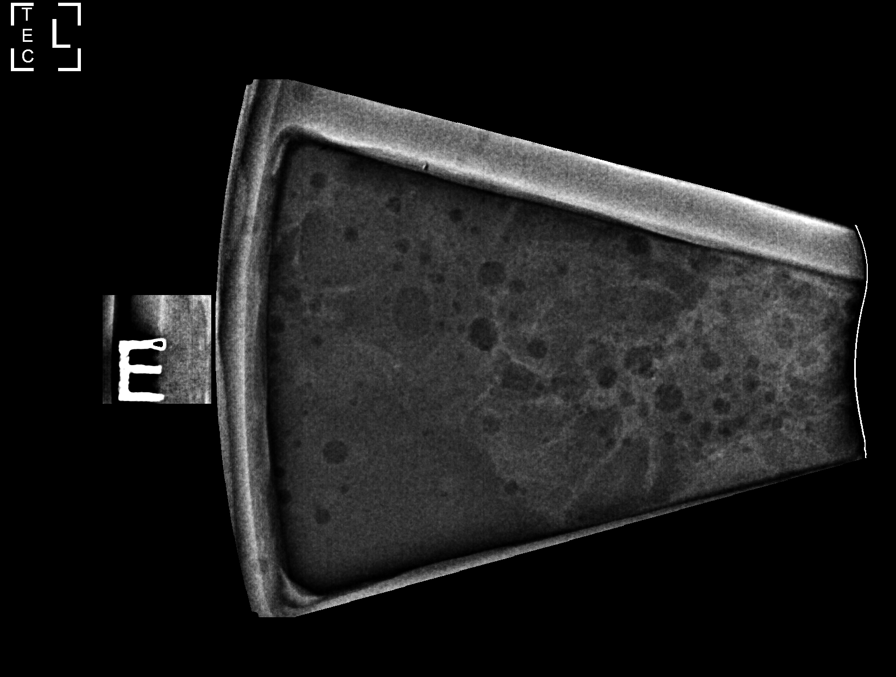

[7 of 27 positions shown; findings below may reference images not displayed]



Using sterile technique and 1% Lidocaine as local anesthetic, under
stereotactic guidance, a 9 gauge vacuum assisted device was used to
perform core needle biopsy of calcifications in the inner left
breast using a superior approach. Specimen radiograph was performed
showing calcifications. Specimens with calcifications are identified
for pathology.

Lesion quadrant: Lower inner quadrant

At the conclusion of the procedure, an X shaped tissue marker clip
was deployed into the biopsy cavity. The clip did not appear to
deploy so an additional COIL shaped clip was placed. Follow-up
2-view mammogram was performed and dictated separately.
IMPRESSION: Stereotactic-guided biopsy of indeterminate calcifications within
the LEFT breast. No apparent complications.

ADDENDUM:
PATHOLOGY revealed: A. BREAST, LEFT, LOWER INNER QUADRANT;
STEREOTACTIC-GUIDED CORE BIOPSY: - DUCTAL CARCINOMA IN SITU (DCIS),
HIGH NUCLEAR GRADE WITH COMEDONECROSIS, ASSOCIATED WITH
CALCIFICATIONS. Comment: High-grade DCIS is present in 8 of 9 blocks
and spans up to 6 mm on a single slide. Correlation with imaging is
advised with regard to extent. Estrogen receptor testing is deferred
to the excision specimen.

Pathology results are CONCORDANT with imaging findings, per Dr. SCHULZE
SCHULZE.

Pathology results and recommendations below were discussed with
patient by telephone on [DATE]. Patient reported biopsy site
within normal limits with slight tenderness at the site. Post biopsy
care instructions were reviewed, questions were answered and my
direct phone number was provided to patient. Patient was instructed
to call [HOSPITAL] if any concerns or questions arise
related to the biopsy.

Recommendation:

1. Bracketing the calcifications for surgery (2.3 cm span) is
recommended.

2.  Consider bilateral breast MRI due to high grade DCIS.

3. Request for surgical consultation was relayed to SCHULZE RN
and SCHULZE RN at [HOSPITAL] [HOSPITAL] by SCHULZE
SCHULZE RN on [DATE].

Pathology results reported by SCHULZE RN on [DATE].



Using sterile technique and 1% Lidocaine as local anesthetic, under
stereotactic guidance, a 9 gauge vacuum assisted device was used to
perform core needle biopsy of calcifications in the inner left
breast using a superior approach. Specimen radiograph was performed
showing calcifications. Specimens with calcifications are identified
for pathology.

Lesion quadrant: Lower inner quadrant

At the conclusion of the procedure, an X shaped tissue marker clip
was deployed into the biopsy cavity. The clip did not appear to
deploy so an additional COIL shaped clip was placed. Follow-up
2-view mammogram was performed and dictated separately.
IMPRESSION: Stereotactic-guided biopsy of indeterminate calcifications within
the LEFT breast. No apparent complications.

## 2020-06-17 IMAGING — MG MM BREAST LOCALIZATION CLIP
4 series · 4 of 12 positions shown · non-contrast
Comparison: Previous exam(s).

CLINICAL DATA: Status post stereotactic biopsy for indeterminate
calcifications within the LEFT breast.

EXAM:
DIAGNOSTIC LEFT MAMMOGRAM POST STEREOTACTIC BIOPSY

[L CC synth-2D]
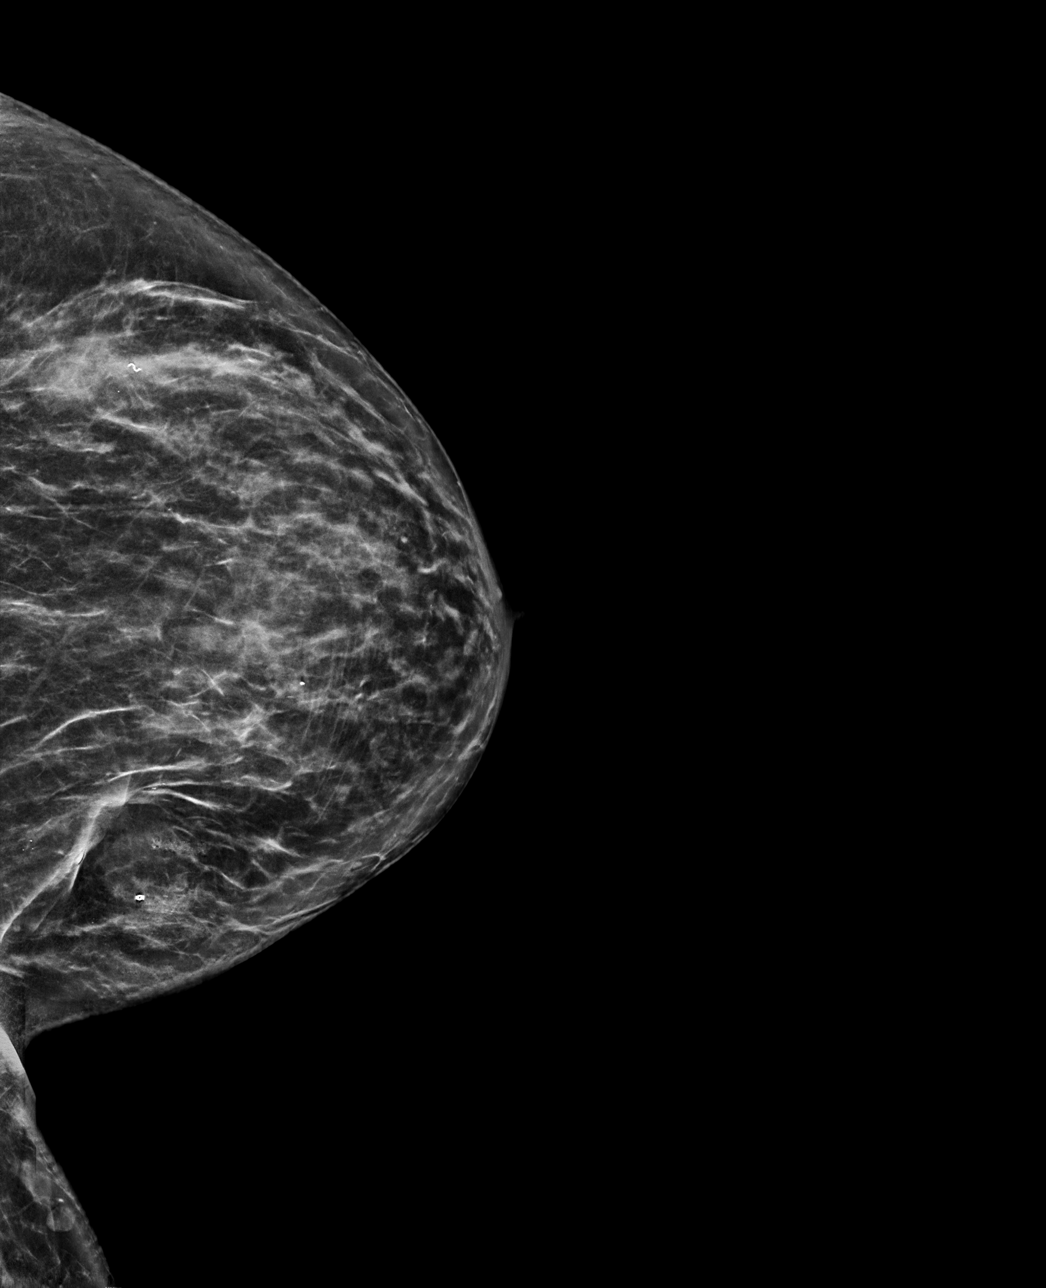

[L ML synth-2D]
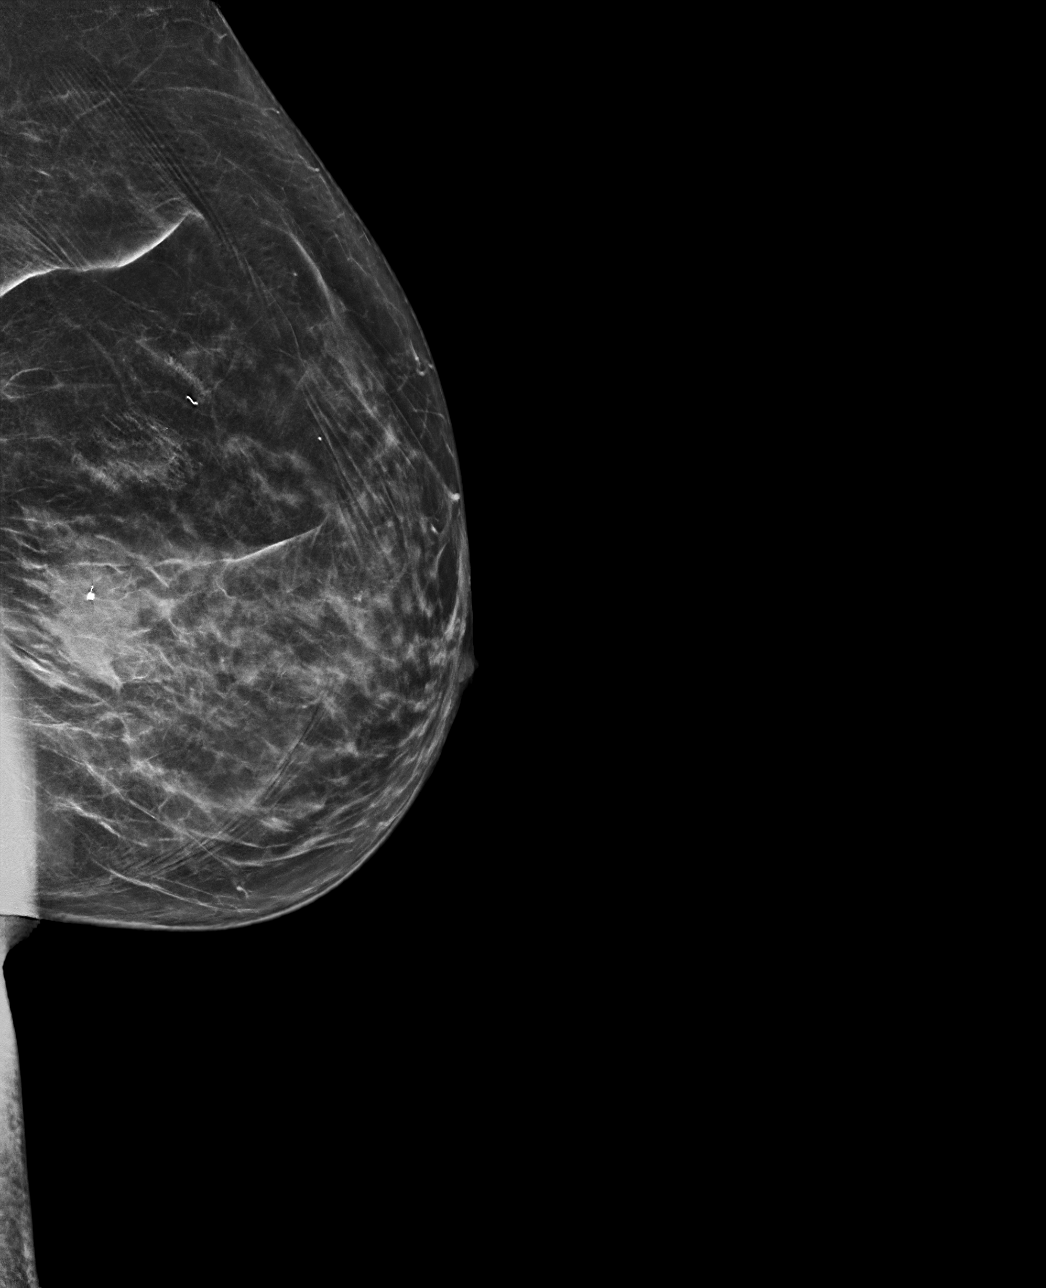

[L ML tomo · tomo slice 34/67.0]
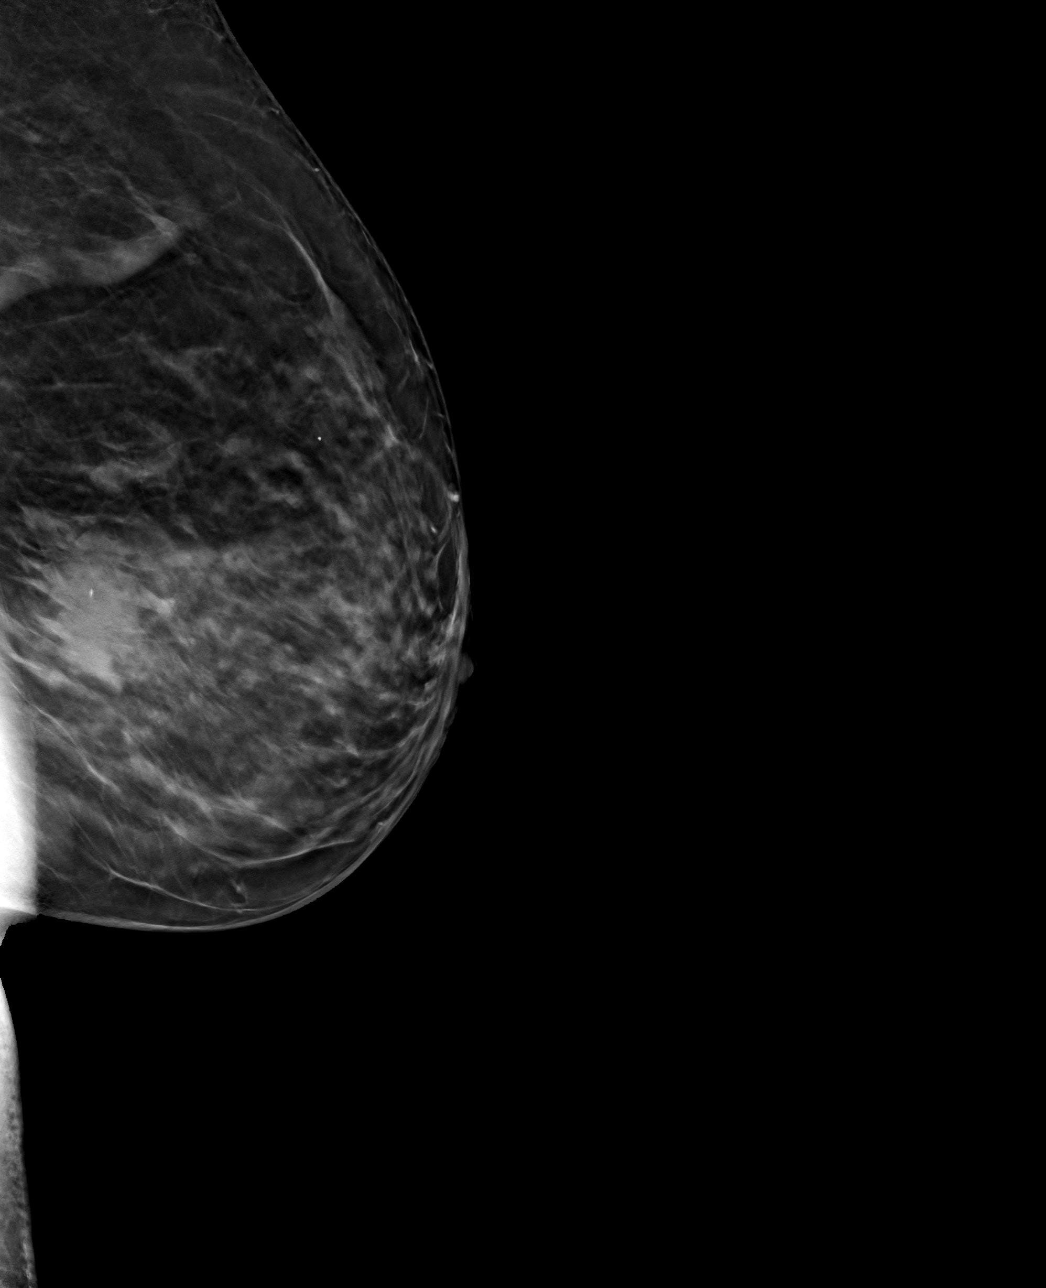

[L CC tomo · tomo slice 33/64.0]
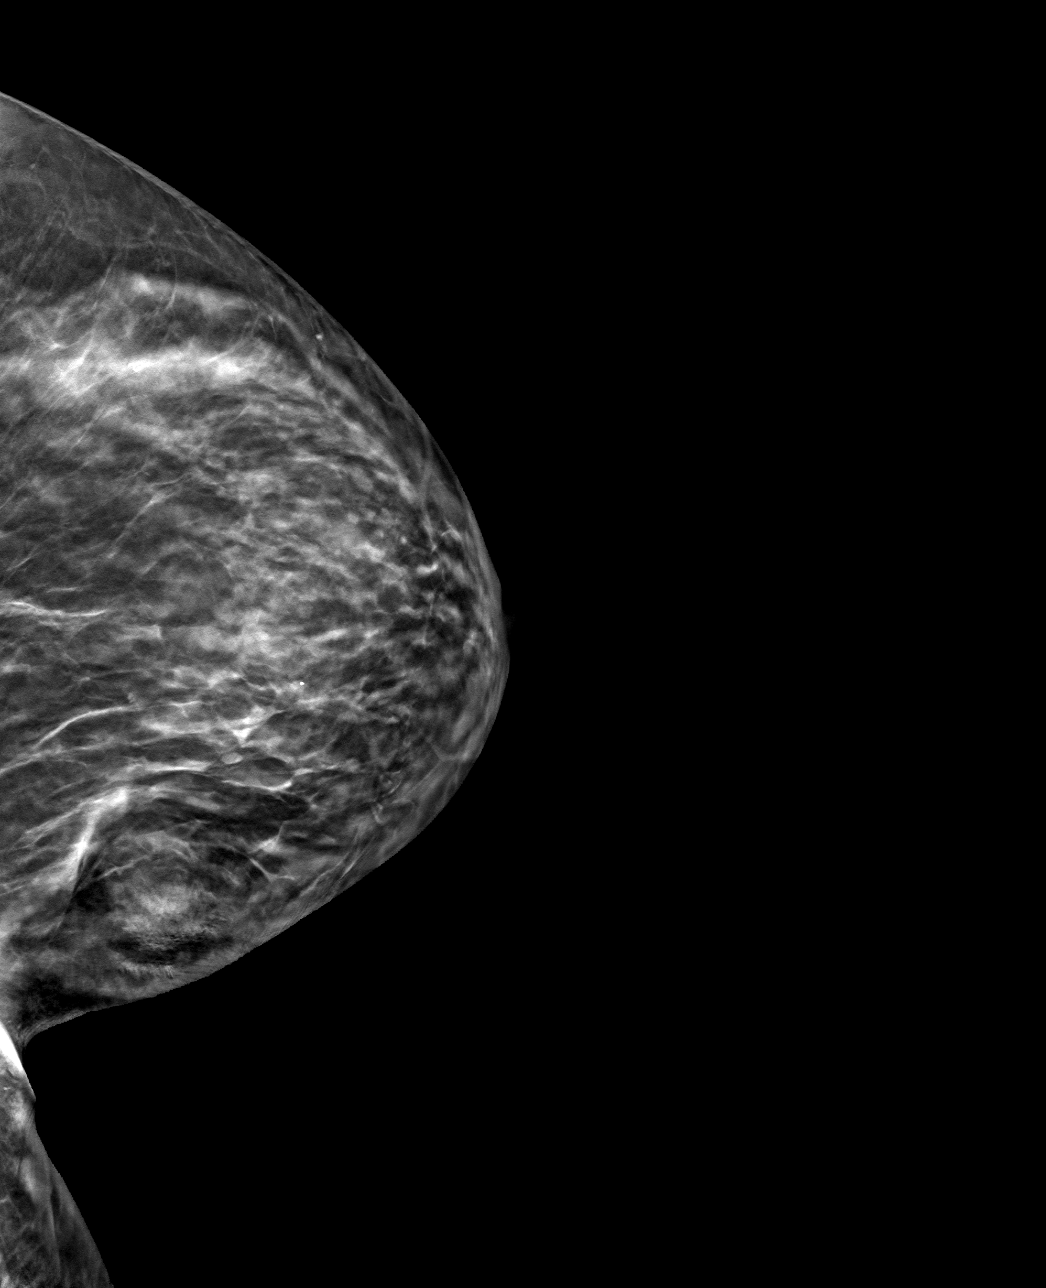

[4 of 12 positions shown; findings below may reference images not displayed]

FINDINGS: Mammographic images were obtained following stereotactic guided
biopsy of LEFT breast calcifications. The biopsy marking clip is in
expected position at the site of biopsy.
IMPRESSION: Appropriate positioning of the coil shaped biopsy marking clip at
the site of biopsy in the inner LEFT breast.

Patient had a prominent postprocedure hematoma. Quick clot was
applied and hemostasis achieved. Pressure bandages were applied.

Final Assessment: Post Procedure Mammograms for Marker Placement

## 2020-06-21 LAB — SURGICAL PATHOLOGY

## 2020-06-22 DIAGNOSIS — D0512 Intraductal carcinoma in situ of left breast: Secondary | ICD-10-CM

## 2020-06-22 NOTE — Progress Notes (Signed)
Initiated navigation.  Patient scheduled for consults with Dr. Hazle Quant, and Dr. Cathie Hoops on 06/28/20.

## 2020-06-28 ENCOUNTER — Inpatient Hospital Stay: Payer: No Typology Code available for payment source | Attending: Oncology | Admitting: Oncology

## 2020-06-28 ENCOUNTER — Inpatient Hospital Stay: Payer: No Typology Code available for payment source

## 2020-06-28 ENCOUNTER — Other Ambulatory Visit: Payer: Self-pay

## 2020-06-28 ENCOUNTER — Other Ambulatory Visit: Payer: Self-pay | Admitting: General Surgery

## 2020-06-28 ENCOUNTER — Encounter: Payer: Self-pay | Admitting: Oncology

## 2020-06-28 VITALS — BP 131/92 | HR 86 | Temp 98.1°F | Resp 18 | Ht 64.96 in | Wt 171.4 lb

## 2020-06-28 DIAGNOSIS — N6310 Unspecified lump in the right breast, unspecified quadrant: Secondary | ICD-10-CM

## 2020-06-28 DIAGNOSIS — Z8673 Personal history of transient ischemic attack (TIA), and cerebral infarction without residual deficits: Secondary | ICD-10-CM | POA: Diagnosis not present

## 2020-06-28 DIAGNOSIS — D0512 Intraductal carcinoma in situ of left breast: Secondary | ICD-10-CM

## 2020-06-28 DIAGNOSIS — Z801 Family history of malignant neoplasm of trachea, bronchus and lung: Secondary | ICD-10-CM | POA: Diagnosis not present

## 2020-06-28 DIAGNOSIS — Z79899 Other long term (current) drug therapy: Secondary | ICD-10-CM

## 2020-06-28 DIAGNOSIS — Z9049 Acquired absence of other specified parts of digestive tract: Secondary | ICD-10-CM | POA: Insufficient documentation

## 2020-06-28 DIAGNOSIS — Z87891 Personal history of nicotine dependence: Secondary | ICD-10-CM

## 2020-06-28 DIAGNOSIS — Z880 Allergy status to penicillin: Secondary | ICD-10-CM

## 2020-06-28 DIAGNOSIS — Z803 Family history of malignant neoplasm of breast: Secondary | ICD-10-CM | POA: Diagnosis not present

## 2020-06-28 LAB — COMPREHENSIVE METABOLIC PANEL
ALT: 20 U/L (ref 0–44)
AST: 26 U/L (ref 15–41)
Albumin: 4.5 g/dL (ref 3.5–5.0)
Alkaline Phosphatase: 57 U/L (ref 38–126)
Anion gap: 11 (ref 5–15)
BUN: 16 mg/dL (ref 6–20)
CO2: 30 mmol/L (ref 22–32)
Calcium: 9.9 mg/dL (ref 8.9–10.3)
Chloride: 97 mmol/L — ABNORMAL LOW (ref 98–111)
Creatinine, Ser: 0.75 mg/dL (ref 0.44–1.00)
GFR calc Af Amer: >60 mL/min (ref >60)
GFR calc non Af Amer: >60 mL/min (ref >60)
Glucose, Bld: 86 mg/dL (ref 70–99)
Potassium: 3.6 mmol/L (ref 3.5–5.1)
Sodium: 138 mmol/L (ref 135–145)
Total Bilirubin: 0.5 mg/dL (ref 0.3–1.2)
Total Protein: 8.3 g/dL — ABNORMAL HIGH (ref 6.5–8.1)

## 2020-06-28 LAB — CBC WITH DIFFERENTIAL/PLATELET
Abs Immature Granulocytes: 0.01 10*3/uL (ref 0.00–0.07)
Basophils Absolute: 0.1 10*3/uL (ref 0.0–0.1)
Basophils Relative: 1 %
Eosinophils Absolute: 0.2 10*3/uL (ref 0.0–0.5)
Eosinophils Relative: 2 %
HCT: 39.5 % (ref 36.0–46.0)
Hemoglobin: 13.1 g/dL (ref 12.0–15.0)
Immature Granulocytes: 0 %
Lymphocytes Relative: 33 %
Lymphs Abs: 2.6 10*3/uL (ref 0.7–4.0)
MCH: 28.4 pg (ref 26.0–34.0)
MCHC: 33.2 g/dL (ref 30.0–36.0)
MCV: 85.5 fL (ref 80.0–100.0)
Monocytes Absolute: 0.6 10*3/uL (ref 0.1–1.0)
Monocytes Relative: 7 %
Neutro Abs: 4.6 10*3/uL (ref 1.7–7.7)
Neutrophils Relative %: 57 %
Platelets: 251 10*3/uL (ref 150–400)
RBC: 4.62 MIL/uL (ref 3.87–5.11)
RDW: 13.3 % (ref 11.5–15.5)
WBC: 8 10*3/uL (ref 4.0–10.5)
nRBC: 0 % (ref 0.0–0.2)

## 2020-06-28 NOTE — Progress Notes (Addendum)
Hematology/Oncology Consult note [facility] Telephone:([phone] [phone]   Patient Care Team: Sherron Monday, MD as PCP - General (Internal Medicine) Scarlett Presto, RN as Oncology Nurse Navigator (Oncology)  REFERRING PROVIDER: Sherron Monday, MD CHIEF COMPLAINTS/REASON FOR VISIT:  Evaluation of breast cancer  HISTORY OF PRESENTING ILLNESS:  Lindsay Guzman is a  54 y.o.  female with PMH listed below who was referred to me for evaluation of breast cancer  Patient had bilateral diagnostic mammogram and ultrasound on 06/09/2020.  Patient previously had mammogram in 2012 and at that time right breast mass in the left breast mass for recommended.  Patient had left breast biopsy but did not proceed with right breast biopsy. 06/09/2020 bilateral diagnostic mammogram showed Biopsy pathology showed: Indeterminate medial left breast calcifications spanning 2.3 cm.  No other suspicious findings.  Medial right breast mass has been stable since 2012. 06/17/2020 patient underwent stereotactic biopsy of left breast calcifications.  Biopsy showed high-grade DCIS with comedonecrosis.  Associated with calcification.   Family history of breast cancer: Breast cancer in 3 maternal aunts Family history of other cancers: Sister had lung cancer  Menarche: 12 Menopause: history of hysterotomy.  Number of pregnancies : 3 children Age at first live childbirth:16 Used OCP: <10 years use Used estrogen and progesterone therapy: denies History of Radiation to the chest: deneis Previous of breast biopsy:   Review of Systems  Constitutional: Negative for appetite change, chills, fatigue and fever.  HENT:   Negative for hearing loss and voice change.   Eyes: Negative for eye problems.  Respiratory: Negative for chest tightness and cough.   Cardiovascular: Negative for chest pain.  Gastrointestinal: Negative for abdominal distention, abdominal pain and blood in  stool.  Endocrine: Negative for hot flashes.  Genitourinary: Negative for difficulty urinating and frequency.   Musculoskeletal: Negative for arthralgias.  Skin: Negative for itching and rash.  Neurological: Negative for extremity weakness.  Hematological: Negative for adenopathy.  Psychiatric/Behavioral: Negative for confusion.     MEDICAL HISTORY:  Past Medical History:  Diagnosis Date  . History of stroke 04/18/2020  . Hypercholesteremia   . Hypertension     SURGICAL HISTORY: Past Surgical History:  Procedure Laterality Date  . ABDOMINAL HYSTERECTOMY    . BREAST BIOPSY Left ?   neg  . BREAST BIOPSY Left 06/17/2020   stereo bx, x-clip, path pending  . CHOLECYSTECTOMY      SOCIAL HISTORY: Social History   Socioeconomic History  . Marital status: Married    Spouse name: Not on file  . Number of children: Not on file  . Years of education: Not on file  . Highest education level: Not on file  Occupational History  . Not on file  Tobacco Use  . Smoking status: Former Smoker    Types: Cigarettes    Quit date: 04/18/2020    Years since quitting: 0.1  . Smokeless tobacco: Never Used  Substance and Sexual Activity  . Alcohol use: Yes  . Drug use: Yes    Types: Marijuana  . Sexual activity: Not on file  Other Topics Concern  . Not on file  Social History Narrative  . Not on file   Social Determinants of Health   Financial Resource Strain:   . Difficulty of Paying Living Expenses: Not on file  Food Insecurity:   . Worried About Programme researcher, broadcasting/film/video in the Last Year: Not on file  . Ran Out of Food in the Last  Year: Not on file  Transportation Needs:   . Lack of Transportation (Medical): Not on file  . Lack of Transportation (Non-Medical): Not on file  Physical Activity:   . Days of Exercise per Week: Not on file  . Minutes of Exercise per Session: Not on file  Stress:   . Feeling of Stress : Not on file  Social Connections:   . Frequency of Communication  with Friends and Family: Not on file  . Frequency of Social Gatherings with Friends and Family: Not on file  . Attends Religious Services: Not on file  . Active Member of Clubs or Organizations: Not on file  . Attends BankerClub or Organization Meetings: Not on file  . Marital Status: Not on file  Intimate Partner Violence:   . Fear of Current or Ex-Partner: Not on file  . Emotionally Abused: Not on file  . Physically Abused: Not on file  . Sexually Abused: Not on file    FAMILY HISTORY: Family History  Problem Relation Age of Onset  . Breast cancer Maternal Aunt   . Lung cancer Sister   . Breast cancer Maternal Aunt   . Breast cancer Maternal Aunt     ALLERGIES:  is allergic to other and penicillins.  MEDICATIONS:  Current Outpatient Medications  Medication Sig Dispense Refill  . amLODipine (NORVASC) 5 MG tablet Take by mouth.    Marland Kitchen. aspirin EC 81 MG tablet Take 81 mg by mouth daily. Swallow whole.    . clopidogrel (PLAVIX) 75 MG tablet Take by mouth.    . hydrALAZINE (APRESOLINE) 100 MG tablet Take by mouth.    . Multiple Vitamins-Minerals (CENTRUM SILVER ADULT 50+ PO) Take 1 tablet by mouth daily.    Marland Kitchen. olmesartan-hydrochlorothiazide (BENICAR HCT) 40-25 MG tablet Take 1 tablet by mouth every morning.    . rosuvastatin (CRESTOR) 20 MG tablet Take by mouth.     No current facility-administered medications for this visit.     PHYSICAL EXAMINATION: ECOG PERFORMANCE STATUS: 0 - Asymptomatic Vitals:   06/28/20 1457  BP: (!) 131/92  Pulse: 86  Resp: 18  Temp: 98.1 F (36.7 C)   Filed Weights   06/28/20 1457  Weight: 171 lb 6.4 oz (77.7 kg)    Physical Exam Constitutional:      General: She is not in acute distress.    Appearance: She is not diaphoretic.  HENT:     Head: Normocephalic and atraumatic.     Nose: Nose normal.     Mouth/Throat:     Pharynx: No oropharyngeal exudate.  Eyes:     General: No scleral icterus.    Pupils: Pupils are equal, round, and reactive  to light.  Cardiovascular:     Rate and Rhythm: Normal rate and regular rhythm.     Heart sounds: No murmur heard.   Pulmonary:     Effort: Pulmonary effort is normal. No respiratory distress.     Breath sounds: No rales.  Chest:     Chest wall: No tenderness.  Abdominal:     General: There is no distension.     Palpations: Abdomen is soft.     Tenderness: There is no abdominal tenderness.  Musculoskeletal:        General: Normal range of motion.     Cervical back: Normal range of motion and neck supple.  Skin:    General: Skin is warm and dry.     Findings: No erythema.  Neurological:     Mental Status:  She is alert and oriented to person, place, and time.     Cranial Nerves: No cranial nerve deficit.     Motor: No abnormal muscle tone.     Coordination: Coordination normal.  Psychiatric:        Mood and Affect: Affect normal.    Breast exam was performed in seated and lying down position. Patient is status post left breast biopsy, palpable post biopsy fluid collection, quarter size.  Right medial mobile smooth mass, pea size.  No palpable lymphadenopathy.     LABORATORY DATA:  I have reviewed the data as listed Lab Results  Component Value Date   WBC 8.0 06/28/2020   HGB 13.1 06/28/2020   HCT 39.5 06/28/2020   MCV 85.5 06/28/2020   PLT 251 06/28/2020   Recent Labs    04/18/20 0913 04/18/20 0913 04/19/20 0857 04/19/20 0857 04/20/20 0438 04/21/20 0827 06/28/20 1544  NA 138   < > 137  --  139  --  138  K 3.3*   < > 3.1*   < > 3.8 3.4* 3.6  CL 102   < > 100  --  104  --  97*  CO2 26   < > 24  --  27  --  30  GLUCOSE 109*   < > 122*  --  97  --  86  BUN 13   < > 16  --  13  --  16  CREATININE 0.87   < > 0.91  --  0.83  --  0.75  CALCIUM 8.9   < > 8.8*  --  8.8*  --  9.9  GFRNONAA >60   < > >60  --  >60  --  >60  GFRAA >60   < > >60  --  >60  --  >60  PROT 7.9  --   --   --   --   --  8.3*  ALBUMIN 4.2  --   --   --  3.6  --  4.5  AST 19  --   --   --    --   --  26  ALT 12  --   --   --   --   --  20  ALKPHOS 72  --   --   --   --   --  57  BILITOT 0.8  --   --   --   --   --  0.5   < > = values in this interval not displayed.   Iron/TIBC/Ferritin/ %Sat No results found for: IRON, TIBC, FERRITIN, IRONPCTSAT      ASSESSMENT & PLAN:  1. Ductal carcinoma in situ (DCIS) of left breast   2. Family history of breast cancer    Images and pathology finding were reviewed and discussed with patient  Recommend lumpectomy. High grade DCIS, it is reasonable to proceed with lumpectomy only, without SLNB. We discussed about 10% chance of potential invasive component and if that is the case, then she will need to have SLNB. Or she may do SLNB at the same time of lumpectomy if she is not enthusiastic about repeat surgery.   Family history of breast cancer, refer to genetic counselor. We discussed about the scenario that if she is tested positive for hereditary genetic mutation that may increase her risk of developing breast cancer, patient expresses wishes not willing to do prophylactic mastectomy.  So she does not need to wait for genetic  testing results prior to the lumpectomy.  Patient will need to have adjuvant radiation.  And if DCIS expresses ER receptors, she will benefit from adjuvant endocrine therapy   Orders Placed This Encounter  Procedures  . CBC with Differential/Platelet    Standing Status:   Future    Number of Occurrences:   1    Standing Expiration Date:   06/28/2021  . Comprehensive metabolic panel    Standing Status:   Future    Number of Occurrences:   1    Standing Expiration Date:   06/28/2021    All questions were answered. The patient knows to call the clinic with any problems questions or concerns.  Return of visit: 2 weeks after surgery Thank you for this kind referral and the opportunity to participate in the care of this patient. A copy of today's note is routed to referrin [name], MD, PhD Hematology Oncology [facility] Pager- [phone] 9/14/2021g provider

## 2020-06-28 NOTE — Progress Notes (Signed)
New patient evaluation.   

## 2020-07-15 ENCOUNTER — Other Ambulatory Visit: Payer: Self-pay

## 2020-07-15 ENCOUNTER — Ambulatory Visit
Admission: RE | Admit: 2020-07-15 | Discharge: 2020-07-15 | Disposition: A | Discharge status: Home / Self Care | Payer: No Typology Code available for payment source | Source: Ambulatory Visit | Attending: General Surgery | Admitting: General Surgery

## 2020-07-15 DIAGNOSIS — D0512 Intraductal carcinoma in situ of left breast: Secondary | ICD-10-CM

## 2020-07-15 IMAGING — MR MR BREAST BILAT WO/W CM
2 of 9 series · 6 of 48 positions shown · IV contrast (7ml Gadavist)
Comparison: Prior mammograms and ultrasounds

CLINICAL DATA: 54-year-old female with newly diagnosed high-grade
DCIS within the INNER LEFT breast.

LABS:  None performed today
EXAM:
BILATERAL BREAST MRI WITH AND WITHOUT CONTRAST
TECHNIQUE: Multiplanar, multisequence MR images of both breasts were obtained
prior to and following the intravenous administration of 7 ml of
Gadavist

[Series 2: T1 · axial · B · 1.5mm · 1.02mm/px · z∈[-68,+99]mm · 5 of 112 slices shown]
[im 1/112]
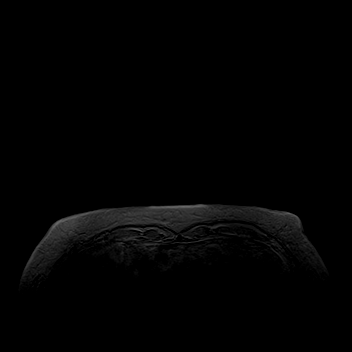
[im 28/112]
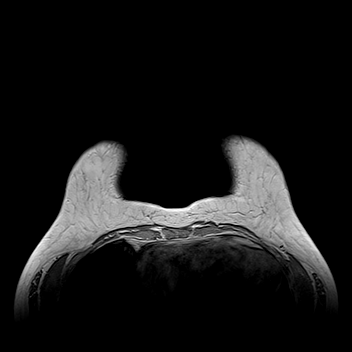
[im 56/112]
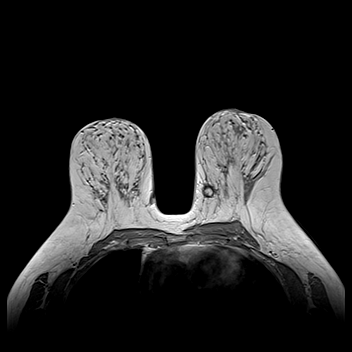
[im 84/112]
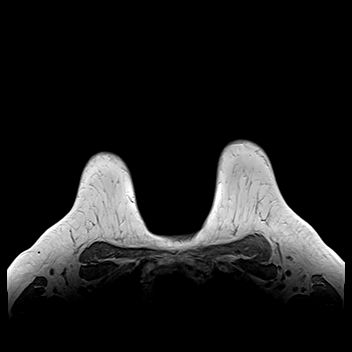
[im 112/112]
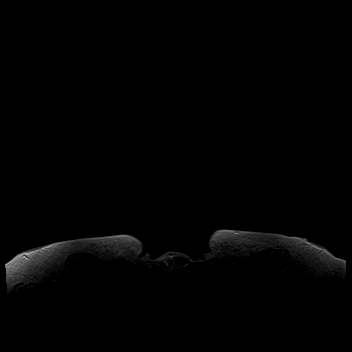

[Series 5: T2 · axial · B · 3.0mm · 1.02mm/px · 1 of 44 slices shown]
[im 1/44]
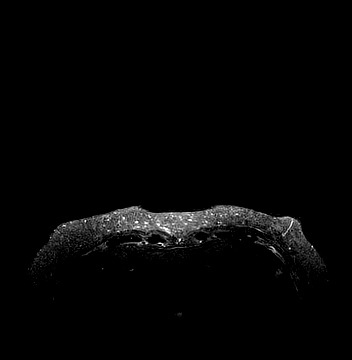

[6 of 48 positions shown; findings below may reference images not displayed]

Three-dimensional MR images were rendered by post-processing of the
original MR data on an independent workstation. The
three-dimensional MR images were interpreted, and findings are
reported in the following complete MRI report for this study. Three
dimensional images were evaluated at the independent interpreting
workstation using the DynaCAD thin client.
FINDINGS: Breast composition: b. Scattered fibroglandular tissue.

Background parenchymal enhancement: Mild

Right breast: No mass or abnormal enhancement. Scattered similar
appearing foci are noted.

Left breast: Biopsy clip artifact and post biopsy changes within the
posterior INNER LEFT breast identified. Minimal enhancement
immediately adjacent to the biopsy clip artifact may represent post
biopsy changes or biopsy proven DCIS.

Scattered foci within the LEFT breast have a similar appearance to
the RIGHT side.

Lymph nodes: No suspicious lymph nodes are identified.

Ancillary findings:  None.
IMPRESSION: 1. Biopsy clip artifact and post biopsy changes at the site of INNER
LEFT breast biopsy-proven DCIS. If breast conservation and
localization is desired, bracketing of the mammographic
calcifications is recommended.
2. No other suspicious abnormalities.

RECOMMENDATION:
Treatment plan.

BI-RADS CATEGORY  6: Known biopsy-proven malignancy.

## 2020-07-15 MED ORDER — GADOBUTROL 1 MMOL/ML IV SOLN
7.0000 mL | Freq: Once | INTRAVENOUS | Status: AC | PRN
  Administered 2020-07-15: 7 mL via INTRAVENOUS

## 2020-07-19 ENCOUNTER — Ambulatory Visit: Payer: Self-pay | Admitting: General Surgery

## 2020-07-19 ENCOUNTER — Other Ambulatory Visit: Payer: Self-pay | Admitting: General Surgery

## 2020-07-19 DIAGNOSIS — D0512 Intraductal carcinoma in situ of left breast: Secondary | ICD-10-CM

## 2020-07-19 NOTE — H&P (View-Only) (Signed)
PATIENT PROFILE: Lindsay Guzman is a 54 y.o. female who presents to the Clinic for consultation at the request of Dr. 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  PCP:  Jobe Gibbon, MD  HISTORY OF PRESENT ILLNESS: Lindsay Guzman reports had diagnostic mammogram and she was found with calcifications on the lower inner portion of the left breast. Calcifications span to a 2.3 cm distance. Patient has history of abnormal findings on both breast many years ago. She had stereotactic core needle biopsy of the calcifications. I personally evaluated the images of the diagnostic mammogram, stereotactic core needle biopsy images and the clip placement images.  Patient denies any breast pain, skin changes, nipple discharge or nipple retraction.  Family history of breast cancer: 3 aunts Family history of other cancers: one aunt with lung cancer Menarche: [age] years old Menopause: In her 36's due to partial hysterectomy Used OCP: none Used estrogen and progesterone therapy: none  History of Radiation to the chest: none Number of pregnancies: 3 Age of first pregnancy: 16    PROBLEM LIST:        Problem List  Date Reviewed: 05/30/2020       Noted   Essential hypertension 04/20/2020   Acute ischemic stroke (CMS-HCC) 04/18/2020      GENERAL REVIEW OF SYSTEMS:   General ROS: negative for - chills, fatigue, fever, weight gain or weight loss Allergy and Immunology ROS: negative for - hives  Hematological and Lymphatic ROS: negative for - bleeding problems or bruising, negative for palpable nodes Endocrine ROS: negative for - heat or cold intolerance, hair changes Respiratory ROS: negative for - cough, shortness of breath or wheezing Cardiovascular ROS: no chest pain or palpitations GI ROS: negative for nausea, vomiting, abdominal pain, diarrhea, constipation Musculoskeletal ROS: negative for - joint swelling or muscle pain Neurological ROS: negative for - confusion,  syncope Dermatological ROS: negative for pruritus and rash Psychiatric: negative for anxiety, depression, difficulty sleeping and memory loss  MEDICATIONS: Current Medications        Current Outpatient Medications  Medication Sig Dispense Refill  . amLODIPine (NORVASC) 5 MG tablet Take 5 mg by mouth once daily    . aspirin 81 MG EC tablet Take 81 mg by mouth once daily.    . clopidogreL (PLAVIX) 75 mg tablet Take 1 tablet (75 mg total) by mouth once daily 90 tablet 3  . hydrALAZINE (APRESOLINE) 100 MG tablet Take 1 tablet (100 mg total) by mouth every 6 (six) hours 90 tablet 3  . multivitamin-lutein (MULTIVITAMIN 50 PLUS) tablet Take 1 tablet by mouth    . olmesartan-amLODIPine-hydrochlorothiazide (TRIBENZOR) 40-10-25 mg tablet Take 1 tablet by mouth once daily    . rosuvastatin (CRESTOR) 20 MG tablet Take 1 tablet (20 mg total) by mouth once daily 30 tablet 11  . hydroCHLOROthiazide (HYDRODIURIL) 25 MG tablet Take 1 tablet (25 mg total) by mouth once daily 30 tablet 1  . loperamide (IMODIUM A-D) 2 mg tablet Take 2 capsules after first loose stool, then one capsule after each following loose stool. Do not exceed 16 mg per day. (Patient not taking: Reported on 06/28/2020  ) 20 tablet 0  . losartan (COZAAR) 50 MG tablet Take 50 mg by mouth once daily (Patient not taking: Reported on 06/28/2020  )    . ondansetron (ZOFRAN-ODT) 4 MG disintegrating tablet Take 1 tablet (4 mg total) by mouth every 8 (eight) hours as needed for Nausea (Patient not taking: Reported on 06/28/2020  ) 20 tablet 0  No current facility-administered medications for this visit.      ALLERGIES: Other and Penicillin  PAST MEDICAL HISTORY: History reviewed. No pertinent past medical history.  PAST SURGICAL HISTORY: History reviewed. No pertinent surgical history.   FAMILY HISTORY: History reviewed. No pertinent family history.   SOCIAL HISTORY: Social History     Socioeconomic History  .  Marital status: Married    Spouse name: Not on file  . Number of children: Not on file  . Years of education: Not on file  . Highest education level: Not on file  Occupational History  . Not on file  Tobacco Use  . Smoking status: Current Some Day Smoker  . Smokeless tobacco: Never Used  Substance and Sexual Activity  . Alcohol use: No  . Drug use: Yes    Types: Marijuana  . Sexual activity: Not on file  Other Topics Concern  . Not on file  Social History Narrative  . Not on file   Social Determinants of Health      Financial Resource Strain:   . Difficulty of Paying Living Expenses:   Food Insecurity:   . Worried About Charity fundraiser in the Last Year:   . Arboriculturist in the Last Year:   Transportation Needs:   . Film/video editor (Medical):   Marland Kitchen Lack of Transportation (Non-Medical):       PHYSICAL EXAM:    Vitals:   06/28/20 1047  BP: (!) 135/96  Pulse: 94   Body mass index is 29.01 kg/m. Weight: 76.7 kg (169 lb)   GENERAL: Alert, active, oriented x3  HEENT: Pupils equal reactive to light. Extraocular movements are intact. Sclera clear. Palpebral conjunctiva normal red color.Pharynx clear.  NECK: Supple with no palpable mass and no adenopathy.  LUNGS: Sound clear with no rales rhonchi or wheezes.  HEART: Regular rhythm S1 and S2 without murmur.  BREAST: Both breast were examined in the sitting and supine position. The left breast has a small hematoma on the medial aspect. Otherwise there is no other skin changes or other palpable masses. There is no nipple discharge. There is no axial adenopathy. The right breast without any skin changes, no palpable masses, no nipple discharge, no axillary adenopathy..  ABDOMEN: Soft and depressible, nontender with no palpable mass, no hepatomegaly.  EXTREMITIES: Well-developed well-nourished symmetrical with no dependent edema.  NEUROLOGICAL: Awake alert oriented, facial expression  symmetrical, moving all extremities.  REVIEW OF DATA: I have reviewed the following data today:      No visits with results within 3 Month(s) from this visit.  Latest known visit with results is:  Initial consult on 03/29/2017  Component Date Value  . WBC (White Blood Cell Co* 03/29/2017 9.4   . RBC (Red Blood Cell Coun* 03/29/2017 5.06   . Hemoglobin 03/29/2017 14.6   . Hematocrit 03/29/2017 43.2   . MCV (Mean Corpuscular Vo* 03/29/2017 85.4   . MCH (Mean Corpuscular He* 03/29/2017 28.9   . MCHC (Mean Corpuscular H* 03/29/2017 33.8   . Platelet Count 03/29/2017 261   . RDW-CV (Red Cell Distrib* 03/29/2017 13.3   . MPV (Mean Platelet Volum* 03/29/2017 10.5   . Neutrophils 03/29/2017 5.97   . Lymphocytes 03/29/2017 2.83   . Monocytes 03/29/2017 0.49   . Eosinophils 03/29/2017 0.09   . Basophils 03/29/2017 0.04   . Neutrophil % 03/29/2017 63.3   . Lymphocyte % 03/29/2017 30.0   . Monocyte % 03/29/2017 5.2   . Eosinophil %  03/29/2017 1.0   . Basophil% 03/29/2017 0.4   . Immature Granulocyte % 03/29/2017 0.1   . Immature Granulocyte Cou* 03/29/2017 0.01   . Glucose 03/29/2017 91   . Sodium 03/29/2017 140   . Potassium 03/29/2017 3.2*  . Chloride 03/29/2017 104   . Carbon Dioxide (CO2) 03/29/2017 29.2   . Urea Nitrogen (BUN) 03/29/2017 9   . Creatinine 03/29/2017 0.7   . Glomerular Filtration Ra* 03/29/2017 107   . Calcium 03/29/2017 9.7   . AST  03/29/2017 13   . ALT  03/29/2017 9   . Alk Phos (alkaline Phosp* 03/29/2017 75   . Albumin 03/29/2017 4.5   . Bilirubin, Total 03/29/2017 0.3   . Protein, Total 03/29/2017 8.3*  . A/G Ratio 03/29/2017 1.2   . Color 03/29/2017 Yellow   . Clarity 03/29/2017 Cloudy*  . Specific Gravity 03/29/2017 1.015   . pH, Urine 03/29/2017 7.0   . Protein, Urinalysis 03/29/2017 Trace   . Glucose, Urinalysis 03/29/2017 Negative   . Ketones, Urinalysis 03/29/2017 Negative   . Blood, Urinalysis 03/29/2017 Moderate*  . Nitrite, Urinalysis  03/29/2017 Positive*  . Leukocyte Esterase, Urin* 03/29/2017 Large*  . White Blood Cells, Urina* 03/29/2017 >50*  . Red Blood Cells, Urinaly* 03/29/2017 4-10*  . Bacteria, Urinalysis 03/29/2017 Moderate*  . Squamous Epithelial Cell* 03/29/2017 Rare   . Other Epithelial, Urinal* 03/29/2017 Rare*  . Anaerobic Culture - [facility]* 03/29/2017 Final report   . Result 1 - [facility] 03/29/2017 Comment   . Aerobic Culture - [facility] 03/29/2017 Final report   . Result 1 - [facility] 03/29/2017 Mixed skin flora      ASSESSMENT: Lindsay Guzman is a 54 y.o. female presenting for consultation for left breast DCIS.    Patient was oriented again about the pathology results. Surgical alternatives were discussed with patient including partial vs total mastectomy. Surgical technique and post operative care was discussed with patient. Risk of surgery was discussed with patient including but not limited to: wound infection, seroma, hematoma, brachial plexopathy, mondor's disease (thrombosis of small veins of breast), chronic wound pain, breast lymphedema, altered sensation to the nipple and cosmesis among others.   Due to the finding of high-grade DCIS with comedonecrosis and the discrepancy of the images and the biopsy in terms of the size of the DCIS I think that is reasonable to proceed with MRI of the breast for complete evaluation of the extension of the disease. This will be important for the discussion of the surgical alternative and decision making regarding partial mastectomy versus total mastectomy. These recommendations were given by breast radiologist and I agree.  MRI shows no other abnormality on either breast. Patient again oriented about partial mastectomy with SLNBx and she agreed to proceed.   Ductal carcinoma in situ (DCIS) of left breast [D05.12]  PLAN: 1. Left partial mastectomy and sentinel lymph node biopsy  Patient and her mother verbalized understanding, all questions were answered, and were  agreeable with the plan outlined above.   I spent a total of 60 minutes in both face-to-face and non-face-to-face activities for this visit on the date of this encounter.  Herbert Pun, MD  Electronically signed by Herbert Pun, MD

## 2020-07-19 NOTE — H&P (Signed)
PATIENT PROFILE: Lindsay Guzman is a 54 y.o. female who presents to the Clinic for consultation at the request of Dr. 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  PCP:  Jobe Gibbon, MD  HISTORY OF PRESENT ILLNESS: Lindsay Guzman reports had diagnostic mammogram and she was found with calcifications on the lower inner portion of the left breast. Calcifications span to a 2.3 cm distance. Patient has history of abnormal findings on both breast many years ago. She had stereotactic core needle biopsy of the calcifications. I personally evaluated the images of the diagnostic mammogram, stereotactic core needle biopsy images and the clip placement images.  Patient denies any breast pain, skin changes, nipple discharge or nipple retraction.  Family history of breast cancer: 3 aunts Family history of other cancers: one aunt with lung cancer Menarche: 24-46 years old Menopause: In her 37's due to partial hysterectomy Used OCP: none Used estrogen and progesterone therapy: none  History of Radiation to the chest: none Number of pregnancies: 3 Age of first pregnancy: 16    PROBLEM LIST:        Problem List  Date Reviewed: 05/30/2020       Noted   Essential hypertension 04/20/2020   Acute ischemic stroke (CMS-HCC) 04/18/2020      GENERAL REVIEW OF SYSTEMS:   General ROS: negative for - chills, fatigue, fever, weight gain or weight loss Allergy and Immunology ROS: negative for - hives  Hematological and Lymphatic ROS: negative for - bleeding problems or bruising, negative for palpable nodes Endocrine ROS: negative for - heat or cold intolerance, hair changes Respiratory ROS: negative for - cough, shortness of breath or wheezing Cardiovascular ROS: no chest pain or palpitations GI ROS: negative for nausea, vomiting, abdominal pain, diarrhea, constipation Musculoskeletal ROS: negative for - joint swelling or muscle pain Neurological ROS: negative for - confusion,  syncope Dermatological ROS: negative for pruritus and rash Psychiatric: negative for anxiety, depression, difficulty sleeping and memory loss  MEDICATIONS: Current Medications        Current Outpatient Medications  Medication Sig Dispense Refill  . amLODIPine (NORVASC) 5 MG tablet Take 5 mg by mouth once daily    . aspirin 81 MG EC tablet Take 81 mg by mouth once daily.    . clopidogreL (PLAVIX) 75 mg tablet Take 1 tablet (75 mg total) by mouth once daily 90 tablet 3  . hydrALAZINE (APRESOLINE) 100 MG tablet Take 1 tablet (100 mg total) by mouth every 6 (six) hours 90 tablet 3  . multivitamin-lutein (MULTIVITAMIN 50 PLUS) tablet Take 1 tablet by mouth    . olmesartan-amLODIPine-hydrochlorothiazide (TRIBENZOR) 40-10-25 mg tablet Take 1 tablet by mouth once daily    . rosuvastatin (CRESTOR) 20 MG tablet Take 1 tablet (20 mg total) by mouth once daily 30 tablet 11  . hydroCHLOROthiazide (HYDRODIURIL) 25 MG tablet Take 1 tablet (25 mg total) by mouth once daily 30 tablet 1  . loperamide (IMODIUM A-D) 2 mg tablet Take 2 capsules after first loose stool, then one capsule after each following loose stool. Do not exceed 16 mg per day. (Patient not taking: Reported on 06/28/2020  ) 20 tablet 0  . losartan (COZAAR) 50 MG tablet Take 50 mg by mouth once daily (Patient not taking: Reported on 06/28/2020  )    . ondansetron (ZOFRAN-ODT) 4 MG disintegrating tablet Take 1 tablet (4 mg total) by mouth every 8 (eight) hours as needed for Nausea (Patient not taking: Reported on 06/28/2020  ) 20 tablet 0  No current facility-administered medications for this visit.      ALLERGIES: Other and Penicillin  PAST MEDICAL HISTORY: History reviewed. No pertinent past medical history.  PAST SURGICAL HISTORY: History reviewed. No pertinent surgical history.   FAMILY HISTORY: History reviewed. No pertinent family history.   SOCIAL HISTORY: Social History     Socioeconomic History  .  Marital status: Married    Spouse name: Not on file  . Number of children: Not on file  . Years of education: Not on file  . Highest education level: Not on file  Occupational History  . Not on file  Tobacco Use  . Smoking status: Current Some Day Smoker  . Smokeless tobacco: Never Used  Substance and Sexual Activity  . Alcohol use: No  . Drug use: Yes    Types: Marijuana  . Sexual activity: Not on file  Other Topics Concern  . Not on file  Social History Narrative  . Not on file   Social Determinants of Health      Financial Resource Strain:   . Difficulty of Paying Living Expenses:   Food Insecurity:   . Worried About Charity fundraiser in the Last Year:   . Arboriculturist in the Last Year:   Transportation Needs:   . Film/video editor (Medical):   Marland Kitchen Lack of Transportation (Non-Medical):       PHYSICAL EXAM:    Vitals:   06/28/20 1047  BP: (!) 135/96  Pulse: 94   Body mass index is 29.01 kg/m. Weight: 76.7 kg (169 lb)   GENERAL: Alert, active, oriented x3  HEENT: Pupils equal reactive to light. Extraocular movements are intact. Sclera clear. Palpebral conjunctiva normal red color.Pharynx clear.  NECK: Supple with no palpable mass and no adenopathy.  LUNGS: Sound clear with no rales rhonchi or wheezes.  HEART: Regular rhythm S1 and S2 without murmur.  BREAST: Both breast were examined in the sitting and supine position. The left breast has a small hematoma on the medial aspect. Otherwise there is no other skin changes or other palpable masses. There is no nipple discharge. There is no axial adenopathy. The right breast without any skin changes, no palpable masses, no nipple discharge, no axillary adenopathy..  ABDOMEN: Soft and depressible, nontender with no palpable mass, no hepatomegaly.  EXTREMITIES: Well-developed well-nourished symmetrical with no dependent edema.  NEUROLOGICAL: Awake alert oriented, facial expression  symmetrical, moving all extremities.  REVIEW OF DATA: I have reviewed the following data today:      No visits with results within 3 Month(s) from this visit.  Latest known visit with results is:  Initial consult on 03/29/2017  Component Date Value  . WBC (White Blood Cell Co* 03/29/2017 9.4   . RBC (Red Blood Cell Coun* 03/29/2017 5.06   . Hemoglobin 03/29/2017 14.6   . Hematocrit 03/29/2017 43.2   . MCV (Mean Corpuscular Vo* 03/29/2017 85.4   . MCH (Mean Corpuscular He* 03/29/2017 28.9   . MCHC (Mean Corpuscular H* 03/29/2017 33.8   . Platelet Count 03/29/2017 261   . RDW-CV (Red Cell Distrib* 03/29/2017 13.3   . MPV (Mean Platelet Volum* 03/29/2017 10.5   . Neutrophils 03/29/2017 5.97   . Lymphocytes 03/29/2017 2.83   . Monocytes 03/29/2017 0.49   . Eosinophils 03/29/2017 0.09   . Basophils 03/29/2017 0.04   . Neutrophil % 03/29/2017 63.3   . Lymphocyte % 03/29/2017 30.0   . Monocyte % 03/29/2017 5.2   . Eosinophil %  03/29/2017 1.0   . Basophil% 03/29/2017 0.4   . Immature Granulocyte % 03/29/2017 0.1   . Immature Granulocyte Cou* 03/29/2017 0.01   . Glucose 03/29/2017 91   . Sodium 03/29/2017 140   . Potassium 03/29/2017 3.2*  . Chloride 03/29/2017 104   . Carbon Dioxide (CO2) 03/29/2017 29.2   . Urea Nitrogen (BUN) 03/29/2017 9   . Creatinine 03/29/2017 0.7   . Glomerular Filtration Ra* 03/29/2017 107   . Calcium 03/29/2017 9.7   . AST  03/29/2017 13   . ALT  03/29/2017 9   . Alk Phos (alkaline Phosp* 03/29/2017 75   . Albumin 03/29/2017 4.5   . Bilirubin, Total 03/29/2017 0.3   . Protein, Total 03/29/2017 8.3*  . A/G Ratio 03/29/2017 1.2   . Color 03/29/2017 Yellow   . Clarity 03/29/2017 Cloudy*  . Specific Gravity 03/29/2017 1.015   . pH, Urine 03/29/2017 7.0   . Protein, Urinalysis 03/29/2017 Trace   . Glucose, Urinalysis 03/29/2017 Negative   . Ketones, Urinalysis 03/29/2017 Negative   . Blood, Urinalysis 03/29/2017 Moderate*  . Nitrite, Urinalysis  03/29/2017 Positive*  . Leukocyte Esterase, Urin* 03/29/2017 Large*  . White Blood Cells, Urina* 03/29/2017 >50*  . Red Blood Cells, Urinaly* 03/29/2017 4-10*  . Bacteria, Urinalysis 03/29/2017 Moderate*  . Squamous Epithelial Cell* 03/29/2017 Rare   . Other Epithelial, Urinal* 03/29/2017 Rare*  . Anaerobic Culture - [facility]* 03/29/2017 Final report   . Result 1 - [facility] 03/29/2017 Comment   . Aerobic Culture - [facility] 03/29/2017 Final report   . Result 1 - [facility] 03/29/2017 Mixed skin flora      ASSESSMENT: Lindsay Guzman is a 54 y.o. female presenting for consultation for left breast DCIS.    Patient was oriented again about the pathology results. Surgical alternatives were discussed with patient including partial vs total mastectomy. Surgical technique and post operative care was discussed with patient. Risk of surgery was discussed with patient including but not limited to: wound infection, seroma, hematoma, brachial plexopathy, mondor's disease (thrombosis of small veins of breast), chronic wound pain, breast lymphedema, altered sensation to the nipple and cosmesis among others.   Due to the finding of high-grade DCIS with comedonecrosis and the discrepancy of the images and the biopsy in terms of the size of the DCIS I think that is reasonable to proceed with MRI of the breast for complete evaluation of the extension of the disease. This will be important for the discussion of the surgical alternative and decision making regarding partial mastectomy versus total mastectomy. These recommendations were given by breast radiologist and I agree.  MRI shows no other abnormality on either breast. Patient again oriented about partial mastectomy with SLNBx and she agreed to proceed.   Ductal carcinoma in situ (DCIS) of left breast [D05.12]  PLAN: 1. Left partial mastectomy and sentinel lymph node biopsy  Patient and her mother verbalized understanding, all questions were answered, and were  agreeable with the plan outlined above.   I spent a total of 60 minutes in both face-to-face and non-face-to-face activities for this visit on the date of this encounter.  Herbert Pun, MD  Electronically signed by Herbert Pun, MD

## 2020-07-20 ENCOUNTER — Inpatient Hospital Stay: Payer: No Typology Code available for payment source | Attending: Oncology

## 2020-07-20 ENCOUNTER — Other Ambulatory Visit: Payer: Self-pay | Admitting: General Surgery

## 2020-07-20 DIAGNOSIS — D0512 Intraductal carcinoma in situ of left breast: Secondary | ICD-10-CM | POA: Insufficient documentation

## 2020-07-20 DIAGNOSIS — R0602 Shortness of breath: Secondary | ICD-10-CM | POA: Insufficient documentation

## 2020-07-20 DIAGNOSIS — E861 Hypovolemia: Secondary | ICD-10-CM | POA: Insufficient documentation

## 2020-07-20 DIAGNOSIS — N644 Mastodynia: Secondary | ICD-10-CM | POA: Insufficient documentation

## 2020-07-20 DIAGNOSIS — R5383 Other fatigue: Secondary | ICD-10-CM | POA: Insufficient documentation

## 2020-07-20 DIAGNOSIS — Z87442 Personal history of urinary calculi: Secondary | ICD-10-CM | POA: Insufficient documentation

## 2020-07-20 DIAGNOSIS — Z803 Family history of malignant neoplasm of breast: Secondary | ICD-10-CM | POA: Insufficient documentation

## 2020-07-20 DIAGNOSIS — Z79899 Other long term (current) drug therapy: Secondary | ICD-10-CM | POA: Insufficient documentation

## 2020-07-20 DIAGNOSIS — D72829 Elevated white blood cell count, unspecified: Secondary | ICD-10-CM | POA: Insufficient documentation

## 2020-07-20 DIAGNOSIS — I9589 Other hypotension: Secondary | ICD-10-CM | POA: Insufficient documentation

## 2020-07-20 DIAGNOSIS — Z801 Family history of malignant neoplasm of trachea, bronchus and lung: Secondary | ICD-10-CM | POA: Insufficient documentation

## 2020-07-20 DIAGNOSIS — Z20822 Contact with and (suspected) exposure to covid-19: Secondary | ICD-10-CM | POA: Insufficient documentation

## 2020-07-20 DIAGNOSIS — Z8673 Personal history of transient ischemic attack (TIA), and cerebral infarction without residual deficits: Secondary | ICD-10-CM | POA: Insufficient documentation

## 2020-07-20 DIAGNOSIS — Z170 Estrogen receptor positive status [ER+]: Secondary | ICD-10-CM | POA: Insufficient documentation

## 2020-07-20 DIAGNOSIS — N6489 Other specified disorders of breast: Secondary | ICD-10-CM | POA: Insufficient documentation

## 2020-07-22 ENCOUNTER — Other Ambulatory Visit: Payer: Self-pay

## 2020-07-22 ENCOUNTER — Encounter
Admission: RE | Admit: 2020-07-22 | Discharge: 2020-07-22 | Disposition: A | Discharge status: Home / Self Care | Payer: No Typology Code available for payment source | Source: Ambulatory Visit | Attending: General Surgery | Admitting: General Surgery

## 2020-07-22 HISTORY — DX: Unspecified osteoarthritis, unspecified site: M19.90

## 2020-07-22 HISTORY — DX: Personal history of urinary calculi: Z87.442

## 2020-07-22 HISTORY — DX: Cerebral infarction, unspecified: I63.9

## 2020-07-22 NOTE — Patient Instructions (Signed)
Your procedure is scheduled on: Wednesday July 27, 2020. Report to Day Surgery inside [facility] 2nd floor. To find out your arrival time please call [phone] between 1PM - 3PM on Tuesday July 26, 2020.  Remember: Instructions that are not followed completely may result in serious medical risk,  up to and including death, or upon the discretion of your surgeon and anesthesiologist your  surgery may need to be rescheduled.     _X__ 1. Do not eat food after midnight the night before your procedure.                 No chewing gum or hard candies. You may drink clear liquids up to 2 hours                 before you are scheduled to arrive for your surgery- DO not drink clear                 liquids within 2 hours of the start of your surgery.                 Clear Liquids include:  water, apple juice without pulp, clear Gatorade, G2 or                  Gatorade Zero (avoid Red/Purple/Blue), Black Coffee or Tea (Do not add                 anything to coffee or tea).  __X__2.  On the morning of surgery brush your teeth with toothpaste and water, you                may rinse your mouth with mouthwash if you wish.  Do not swallow any toothpaste of mouthwash.     _X__ 3.  No Alcohol for 24 hours before or after surgery.   _X__ 4.  Do Not Smoke or use e-cigarettes For 24 Hours Prior to Your Surgery.                 Do not use any chewable tobacco products for at least 6 hours prior to                 Surgery.  _X__  5.  Do not use any recreational drugs (marijuana, cocaine, heroin, ecstasy, MDMA or other)                For at least one week prior to your surgery.  Combination of these drugs with anesthesia                May have life threatening results.  __X__ 6.  Notify your doctor if there is any change in your medical condition      (cold, fever, infections).     Do not wear jewelry, make-up, hairpins, clips or nail polish. Do not wear lotions,  powders, or perfumes. You may wear deodorant. Do not shave 48 hours prior to surgery. Men may shave face and neck. Do not bring valuables to the hospital.    [facility] is not responsible for any belongings or valuables.  Contacts, dentures or bridgework may not be worn into surgery. Leave your suitcase in the car. After surgery it may be brought to your room. For patients admitted to the hospital, discharge time is determined by your treatment team.   Patients discharged the day of surgery will not be allowed to drive home.   Make arrangements for someone to be  with you for the first 24 hours of your Same Day Discharge.   __x__ Take these medicines the morning of surgery with A SIP OF WATER:    1. amLODipine (NORVASC) 5 MG  2. hydrALAZINE (APRESOLINE) 100 MG  ____ Fleet Enema (as directed)   __x__ Use CHG Soap (or wipes) as directed  ____ Use Benzoyl Peroxide Gel as instructed  ____ Use inhalers on the day of surgery  ____ Stop metformin 2 days prior to surgery    ____ Take 1/2 of usual insulin dose the night before surgery. No insulin the morning          of surgery.   __x__ Stop aspirin as instructed by your provider.   __x__ Stop Anti-inflammatories such as Ibuprofen, Aleve, Advil, naproxen and or BC powders.    __x__ Stop supplements until after surgery.    __x__ Do not start any herbal supplements before your procedure.    If you have any questions regarding your pre-procedure instructions,  Please call Pre-admit Testing at [phone].

## 2020-07-25 ENCOUNTER — Ambulatory Visit
Admission: RE | Admit: 2020-07-25 | Discharge: 2020-07-25 | Disposition: A | Discharge status: Home / Self Care | Payer: PRIVATE HEALTH INSURANCE | Source: Ambulatory Visit | Attending: General Surgery | Admitting: General Surgery

## 2020-07-25 ENCOUNTER — Ambulatory Visit
Admission: RE | Admit: 2020-07-25 | Discharge: 2020-07-25 | Disposition: A | Discharge status: Home / Self Care | Payer: No Typology Code available for payment source | Source: Ambulatory Visit | Attending: General Surgery | Admitting: General Surgery

## 2020-07-25 ENCOUNTER — Other Ambulatory Visit: Payer: Self-pay

## 2020-07-25 ENCOUNTER — Other Ambulatory Visit
Admission: RE | Admit: 2020-07-25 | Discharge: 2020-07-25 | Disposition: A | Discharge status: Home / Self Care | Payer: PRIVATE HEALTH INSURANCE | Source: Ambulatory Visit | Attending: General Surgery | Admitting: General Surgery

## 2020-07-25 ENCOUNTER — Other Ambulatory Visit: Payer: Self-pay | Admitting: General Surgery

## 2020-07-25 DIAGNOSIS — Z01812 Encounter for preprocedural laboratory examination: Secondary | ICD-10-CM | POA: Diagnosis present

## 2020-07-25 DIAGNOSIS — D0512 Intraductal carcinoma in situ of left breast: Secondary | ICD-10-CM | POA: Diagnosis not present

## 2020-07-25 DIAGNOSIS — Z20822 Contact with and (suspected) exposure to covid-19: Secondary | ICD-10-CM | POA: Diagnosis not present

## 2020-07-25 DIAGNOSIS — Z1231 Encounter for screening mammogram for malignant neoplasm of breast: Secondary | ICD-10-CM | POA: Insufficient documentation

## 2020-07-25 HISTORY — PX: BREAST LUMPECTOMY: SHX2

## 2020-07-25 LAB — SARS CORONAVIRUS 2 (TAT 6-24 HRS): SARS Coronavirus 2: NEGATIVE

## 2020-07-25 IMAGING — MG MM PLC BREAST LOC DEV 1ST LESION INC MAMMO GUIDE*L*
7 series · 7 of 7 positions shown · non-contrast
Comparison: Previous exam(s)

CLINICAL DATA: 54-year-old female presenting for bracketed
localization of DCIS in the left breast prior to lumpectomy.

EXAM:
MAMMOGRAPHIC GUIDED RADIOFREQUENCY DEVICE
LOCALIZATION OF THE LEFT BREAST

[L CC (1 of 3)]
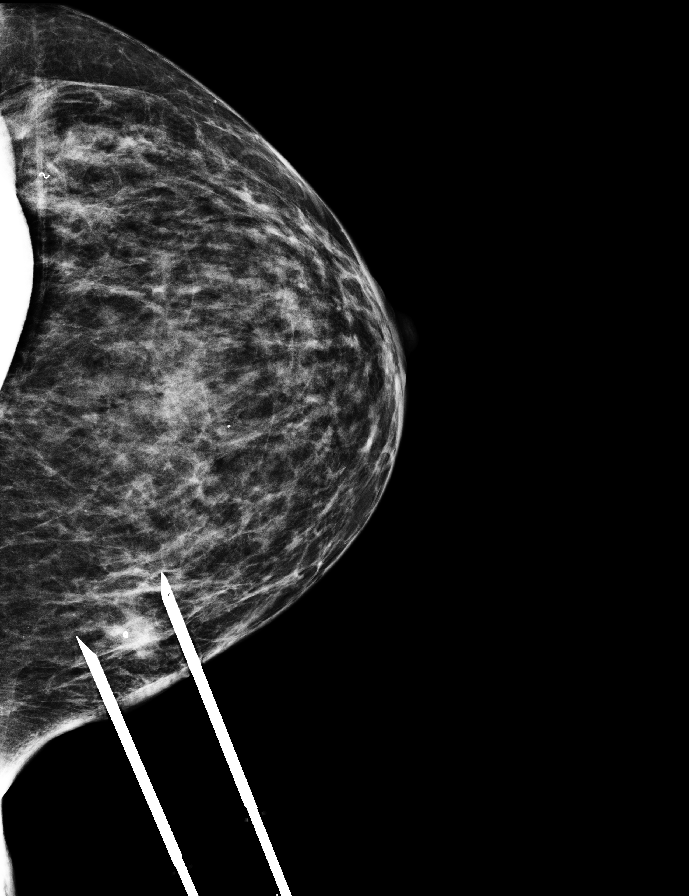

[L CC (2 of 3)]
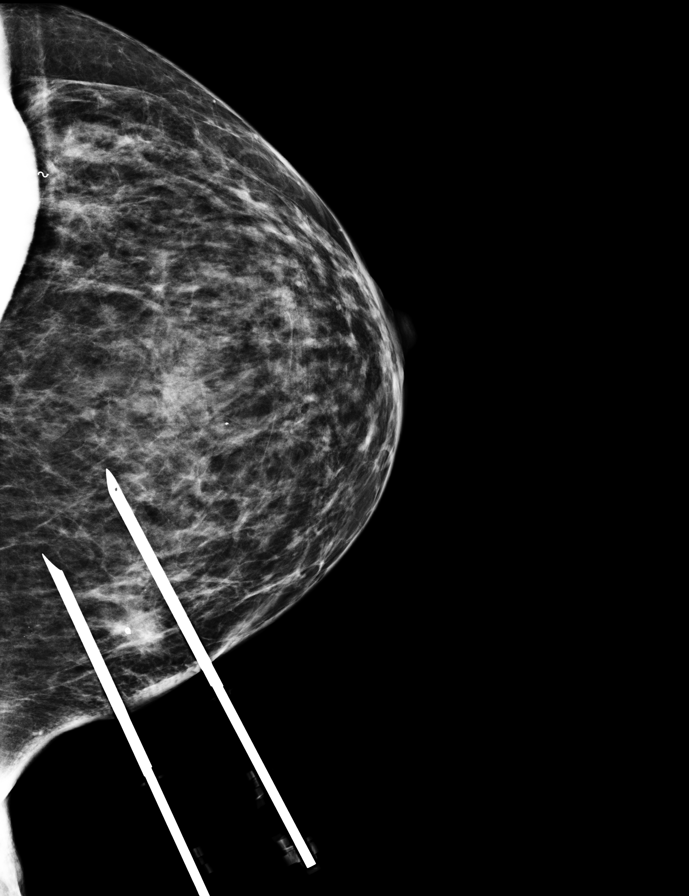

[L ML (1 of 4)]
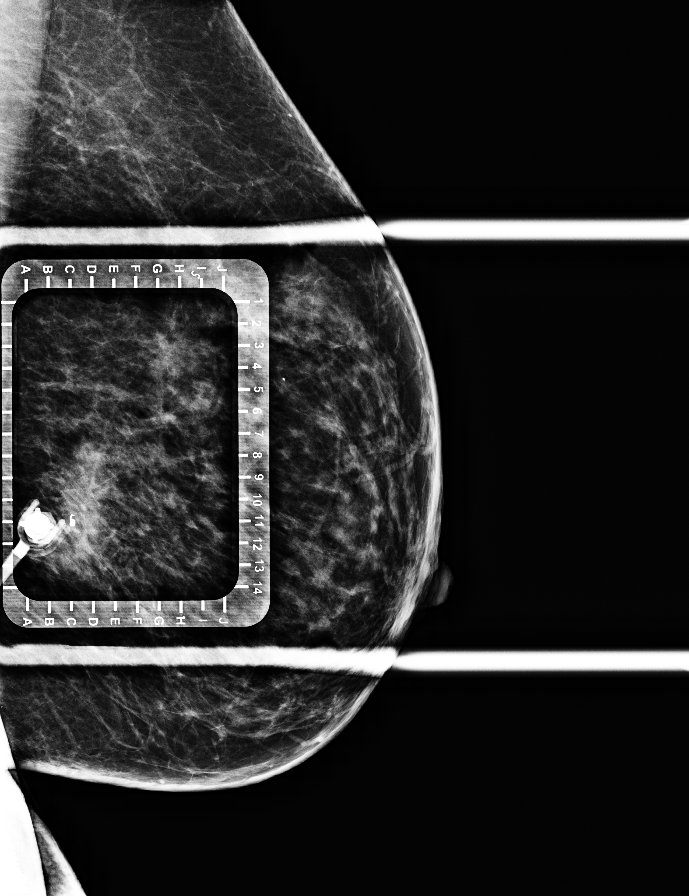

[L CC (3 of 3)]
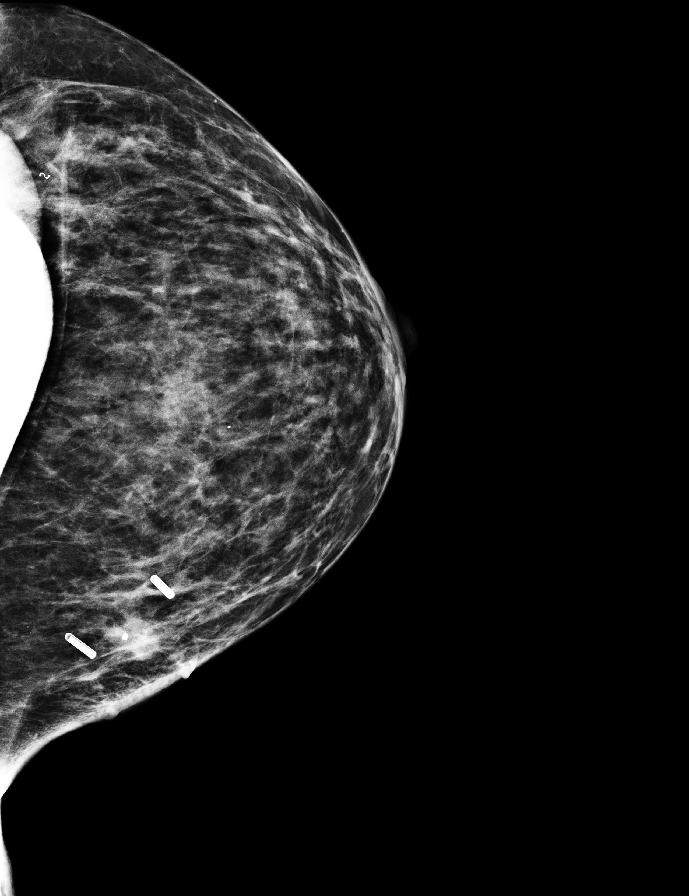

[L ML (2 of 4)]
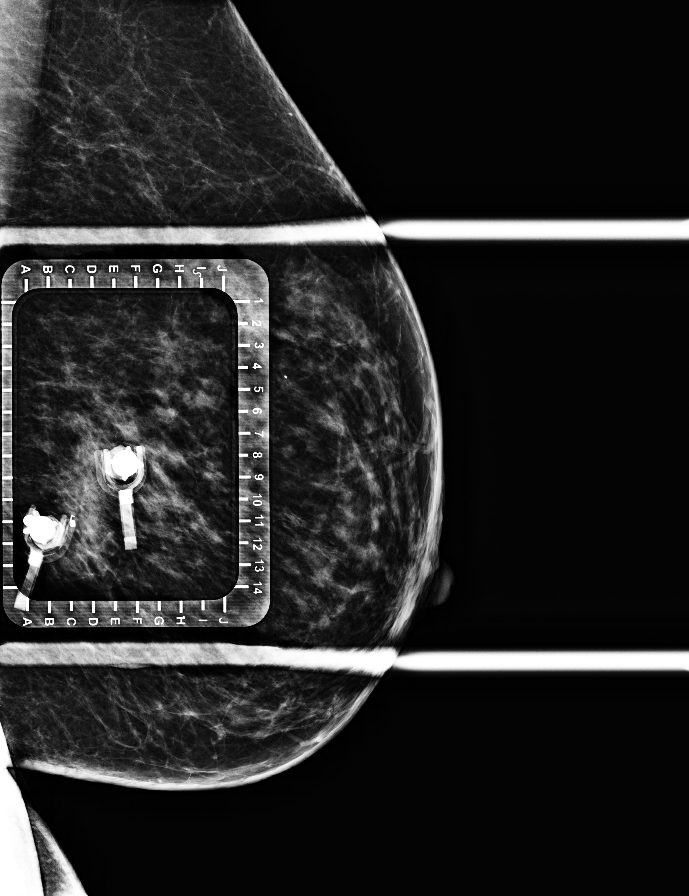

[L ML (3 of 4)]
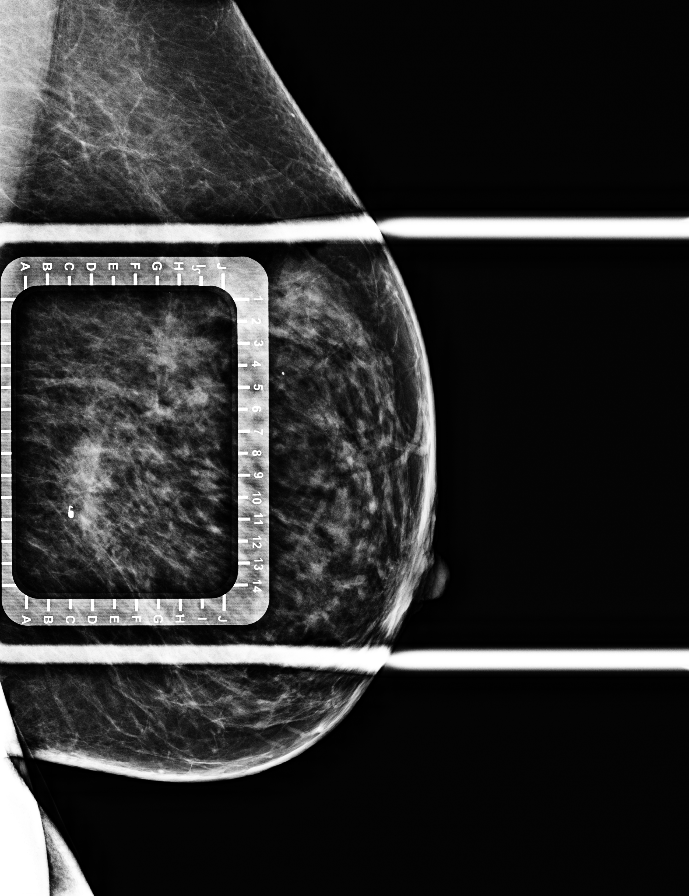

[L ML (4 of 4)]
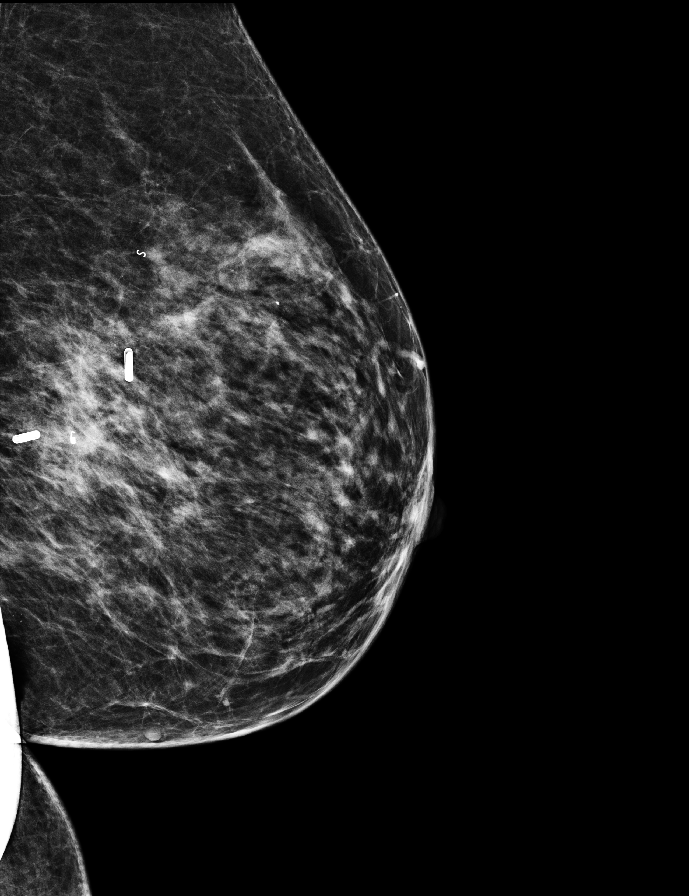

[7 of 7 positions shown; findings below may reference images not displayed]

FINDINGS: Patient presents for radiofrequency device localization prior to
left breast lumpectomy. I met with the patient and we discussed the
procedure of radiofrequency device localization including benefits
and alternatives. We discussed the high likelihood of a successful
procedure. We discussed the risks of the procedure including
infection, bleeding, tissue injury and further surgery. Informed,
written consent was given.

The usual time-out protocol was performed immediately prior to the
procedure.

Using mammographic guidance, sterile technique, 1% lidocaine as
local anesthesia, a 2 radiofrequency tags were used to localize the
anterior and posterior margin of calcifications in the medial left
breast using a medial approach.

The follow-up mammogram images confirm that the RF device is in the
expected location and are marked for Dr. AYERBE.

The patient tolerated the procedure well and was released from the
[REDACTED].
IMPRESSION: Radiofrequency device bracketed localization of the LEFT breast. No
apparent complications.

## 2020-07-27 ENCOUNTER — Ambulatory Visit: Payer: No Typology Code available for payment source | Admitting: Urgent Care

## 2020-07-27 ENCOUNTER — Ambulatory Visit: Payer: No Typology Code available for payment source | Admitting: Anesthesiology

## 2020-07-27 ENCOUNTER — Encounter: Payer: Self-pay | Admitting: General Surgery

## 2020-07-27 ENCOUNTER — Other Ambulatory Visit: Payer: Self-pay

## 2020-07-27 ENCOUNTER — Ambulatory Visit
Admission: RE | Admit: 2020-07-27 | Discharge: 2020-07-27 | Disposition: A | Discharge status: Home / Self Care | Payer: No Typology Code available for payment source | Source: Ambulatory Visit | Attending: General Surgery | Admitting: General Surgery

## 2020-07-27 ENCOUNTER — Encounter
Admission: RE | Disposition: A | Discharge status: Home / Self Care | Payer: Self-pay | Source: Home / Self Care | Attending: General Surgery

## 2020-07-27 ENCOUNTER — Ambulatory Visit
Admission: RE | Admit: 2020-07-27 | Discharge: 2020-07-27 | Disposition: A | Discharge status: Home / Self Care | Payer: No Typology Code available for payment source | Attending: General Surgery | Admitting: General Surgery

## 2020-07-27 DIAGNOSIS — Z87442 Personal history of urinary calculi: Secondary | ICD-10-CM | POA: Diagnosis not present

## 2020-07-27 DIAGNOSIS — Z79899 Other long term (current) drug therapy: Secondary | ICD-10-CM | POA: Diagnosis not present

## 2020-07-27 DIAGNOSIS — I69398 Other sequelae of cerebral infarction: Secondary | ICD-10-CM | POA: Insufficient documentation

## 2020-07-27 DIAGNOSIS — D0512 Intraductal carcinoma in situ of left breast: Secondary | ICD-10-CM

## 2020-07-27 DIAGNOSIS — Z90710 Acquired absence of both cervix and uterus: Secondary | ICD-10-CM | POA: Insufficient documentation

## 2020-07-27 DIAGNOSIS — Z801 Family history of malignant neoplasm of trachea, bronchus and lung: Secondary | ICD-10-CM | POA: Insufficient documentation

## 2020-07-27 DIAGNOSIS — I10 Essential (primary) hypertension: Secondary | ICD-10-CM | POA: Diagnosis not present

## 2020-07-27 DIAGNOSIS — Z880 Allergy status to penicillin: Secondary | ICD-10-CM | POA: Diagnosis not present

## 2020-07-27 DIAGNOSIS — Z803 Family history of malignant neoplasm of breast: Secondary | ICD-10-CM | POA: Diagnosis not present

## 2020-07-27 DIAGNOSIS — F17200 Nicotine dependence, unspecified, uncomplicated: Secondary | ICD-10-CM | POA: Diagnosis not present

## 2020-07-27 DIAGNOSIS — G4730 Sleep apnea, unspecified: Secondary | ICD-10-CM | POA: Insufficient documentation

## 2020-07-27 DIAGNOSIS — E7800 Pure hypercholesterolemia, unspecified: Secondary | ICD-10-CM | POA: Insufficient documentation

## 2020-07-27 LAB — URINE DRUG SCREEN, QUALITATIVE (ARMC ONLY)
Amphetamines, Ur Screen: NOT DETECTED
Barbiturates, Ur Screen: NOT DETECTED
Benzodiazepine, Ur Scrn: NOT DETECTED
Cannabinoid 50 Ng, Ur ~~LOC~~: POSITIVE — AB
Cocaine Metabolite,Ur ~~LOC~~: NOT DETECTED
MDMA (Ecstasy)Ur Screen: NOT DETECTED
Methadone Scn, Ur: NOT DETECTED
Opiate, Ur Screen: NOT DETECTED
Phencyclidine (PCP) Ur S: NOT DETECTED
Tricyclic, Ur Screen: NOT DETECTED

## 2020-07-27 IMAGING — MG MM BREAST SURGICAL SPECIMEN
1 series · 1 of 1 positions shown · non-contrast
Comparison: Previous exam(s).

CLINICAL DATA: Status post reflector tag localized left breast
lumpectomy.

EXAM:
SPECIMEN RADIOGRAPH OF THE LEFT BREAST

[L]
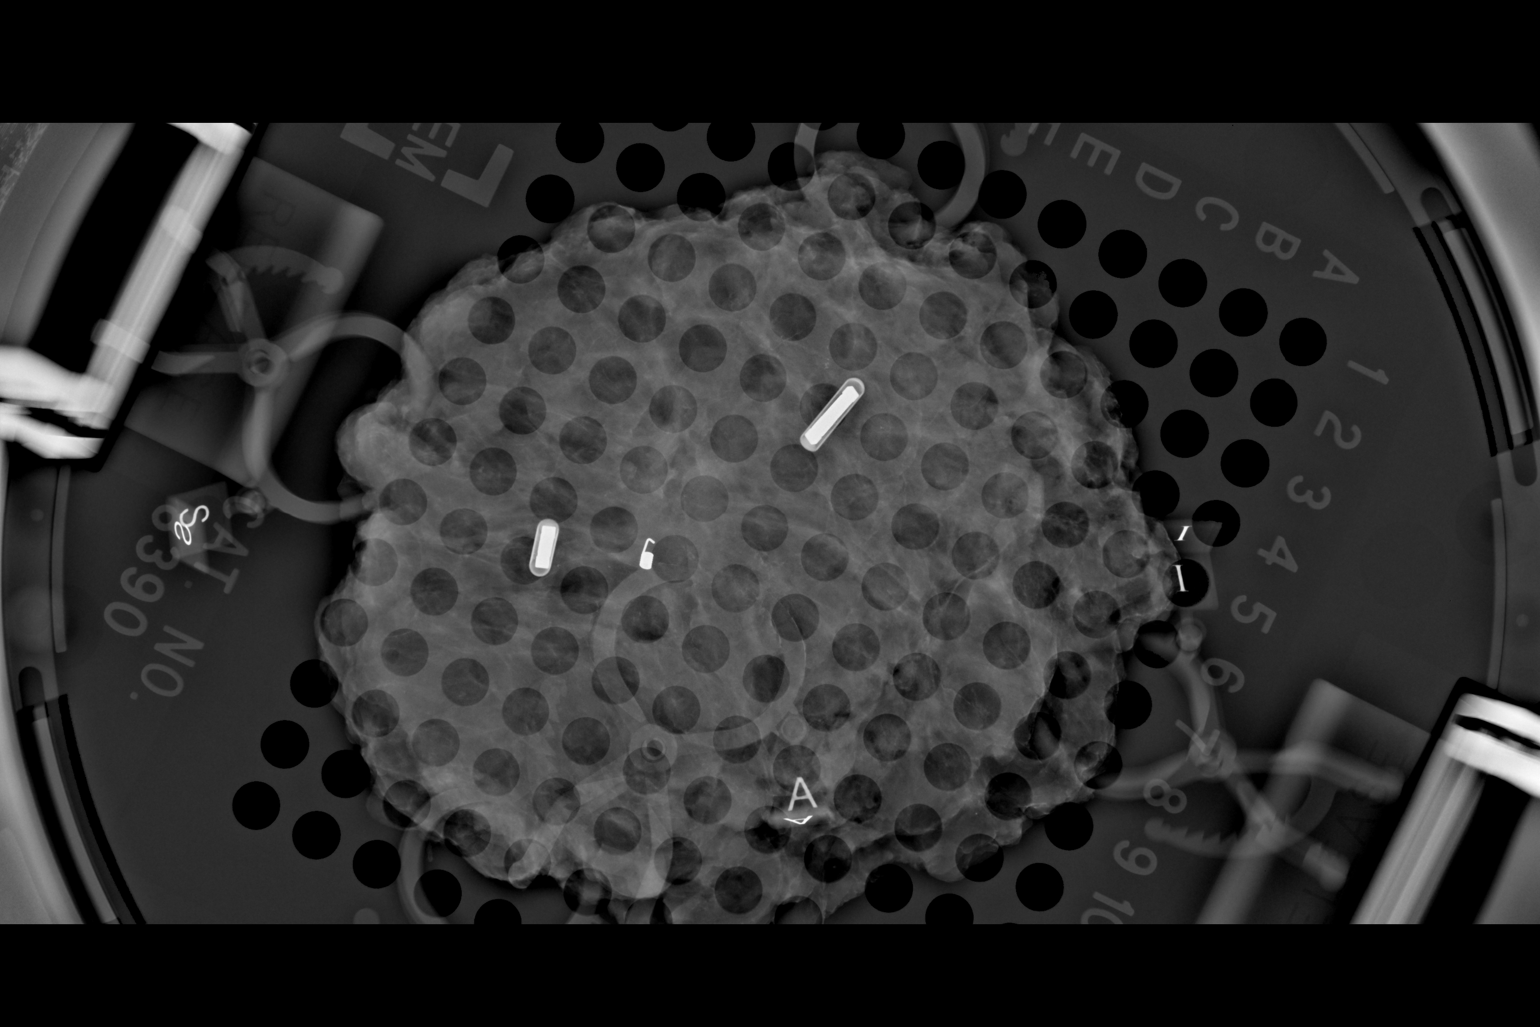

[1 of 1 positions shown; findings below may reference images not displayed]

FINDINGS: Status post excision of the left breast. Two radiofrequency tags and
coil shaped clip are present within the specimen.
IMPRESSION: Specimen radiograph of the left breast.

## 2020-07-27 SURGERY — PARTIAL MASTECTOMY WITH RADIO FREQUENCY LOCALIZER
Anesthesia: General | Site: Breast | Laterality: Left

## 2020-07-27 MED ORDER — ACETAMINOPHEN 10 MG/ML IV SOLN
INTRAVENOUS | Status: DC | PRN
  Administered 2020-07-27: 1000 mg via INTRAVENOUS

## 2020-07-27 MED ORDER — GLYCOPYRROLATE 0.2 MG/ML IJ SOLN
INTRAMUSCULAR | Status: DC | PRN
  Administered 2020-07-27: .2 mg via INTRAVENOUS

## 2020-07-27 MED ORDER — DEXMEDETOMIDINE (PRECEDEX) IN NS 20 MCG/5ML (4 MCG/ML) IV SYRINGE
PREFILLED_SYRINGE | INTRAVENOUS | Status: DC | PRN
  Administered 2020-07-27: 8 ug via INTRAVENOUS

## 2020-07-27 MED ORDER — PROPOFOL 10 MG/ML IV BOLUS
INTRAVENOUS | Status: DC | PRN
  Administered 2020-07-27: 150 mg via INTRAVENOUS
  Administered 2020-07-27: 100 mg via INTRAVENOUS
  Administered 2020-07-27: 50 mg via INTRAVENOUS
  Administered 2020-07-27: 100 mg via INTRAVENOUS

## 2020-07-27 MED ORDER — PHENYLEPHRINE HCL (PRESSORS) 10 MG/ML IV SOLN
INTRAVENOUS | Status: DC | PRN
  Administered 2020-07-27: 100 ug via INTRAVENOUS
  Administered 2020-07-27: 200 ug via INTRAVENOUS
  Administered 2020-07-27 (×4): 100 ug via INTRAVENOUS

## 2020-07-27 MED ORDER — CLINDAMYCIN PHOSPHATE 900 MG/50ML IV SOLN: 900.0000 mg | INTRAVENOUS | Status: DC

## 2020-07-27 MED ORDER — FAMOTIDINE 20 MG PO TABS
20.0000 mg | ORAL_TABLET | Freq: Once | ORAL | Status: AC
  Administered 2020-07-27: 20 mg via ORAL

## 2020-07-27 MED ORDER — ORAL CARE MOUTH RINSE: 15.0000 mL | Freq: Once | OROMUCOSAL | Status: AC

## 2020-07-27 MED ORDER — ACETAMINOPHEN 10 MG/ML IV SOLN: 1000.0000 mg | Freq: Once | INTRAVENOUS | Status: DC | PRN

## 2020-07-27 MED ORDER — SODIUM CHLORIDE 0.9 % IV SOLN
INTRAVENOUS | Status: DC | PRN
  Administered 2020-07-27: 25 ug/min via INTRAVENOUS

## 2020-07-27 MED ORDER — OXYCODONE HCL 5 MG/5ML PO SOLN: 5.0000 mg | Freq: Once | ORAL | Status: AC | PRN

## 2020-07-27 MED ORDER — FENTANYL CITRATE (PF) 100 MCG/2ML IJ SOLN
25.0000 ug | INTRAMUSCULAR | Status: DC | PRN
  Administered 2020-07-27: 25 ug via INTRAVENOUS

## 2020-07-27 MED ORDER — TECHNETIUM TC 99M SULFUR COLLOID FILTERED
0.4960 | Freq: Once | INTRAVENOUS | Status: AC | PRN
  Administered 2020-07-27: 0.496 via INTRADERMAL

## 2020-07-27 MED ORDER — ONDANSETRON HCL 4 MG/2ML IJ SOLN
INTRAMUSCULAR | Status: AC
  Filled 2020-07-27: qty 2

## 2020-07-27 MED ORDER — CEFAZOLIN SODIUM-DEXTROSE 2-3 GM-%(50ML) IV SOLR
INTRAVENOUS | Status: DC | PRN
  Administered 2020-07-27: 2 g via INTRAVENOUS

## 2020-07-27 MED ORDER — HYDROCODONE-ACETAMINOPHEN 5-325 MG PO TABS
1.0000 | ORAL_TABLET | ORAL | 0 refills | Status: AC | PRN
Start: 2020-07-27 — End: 2020-07-30

## 2020-07-27 MED ORDER — ONDANSETRON HCL 4 MG/2ML IJ SOLN: 4.0000 mg | Freq: Once | INTRAMUSCULAR | Status: DC | PRN

## 2020-07-27 MED ORDER — CHLORHEXIDINE GLUCONATE 0.12 % MT SOLN: 15.0000 mL | Freq: Once | OROMUCOSAL | Status: AC

## 2020-07-27 MED ORDER — MIDAZOLAM HCL 2 MG/2ML IJ SOLN
INTRAMUSCULAR | Status: AC
  Filled 2020-07-27: qty 2

## 2020-07-27 MED ORDER — ONDANSETRON HCL 4 MG/2ML IJ SOLN
INTRAMUSCULAR | Status: DC | PRN
  Administered 2020-07-27: 4 mg via INTRAVENOUS

## 2020-07-27 MED ORDER — MIDAZOLAM HCL 2 MG/2ML IJ SOLN
INTRAMUSCULAR | Status: DC | PRN
  Administered 2020-07-27: 2 mg via INTRAVENOUS

## 2020-07-27 MED ORDER — FAMOTIDINE 20 MG PO TABS
ORAL_TABLET | ORAL | Status: AC
  Filled 2020-07-27: qty 1

## 2020-07-27 MED ORDER — BUPIVACAINE-EPINEPHRINE (PF) 0.5% -1:200000 IJ SOLN
INTRAMUSCULAR | Status: AC
  Filled 2020-07-27: qty 30

## 2020-07-27 MED ORDER — LIDOCAINE HCL (CARDIAC) PF 100 MG/5ML IV SOSY
PREFILLED_SYRINGE | INTRAVENOUS | Status: DC | PRN
  Administered 2020-07-27: 80 mg via INTRAVENOUS

## 2020-07-27 MED ORDER — CHLORHEXIDINE GLUCONATE 0.12 % MT SOLN
OROMUCOSAL | Status: AC
  Administered 2020-07-27: 15 mL via OROMUCOSAL
  Filled 2020-07-27: qty 15

## 2020-07-27 MED ORDER — FENTANYL CITRATE (PF) 100 MCG/2ML IJ SOLN
INTRAMUSCULAR | Status: DC | PRN
Start: 2020-07-27 — End: 2020-07-27
  Administered 2020-07-27 (×2): 50 ug via INTRAVENOUS

## 2020-07-27 MED ORDER — OXYCODONE HCL 5 MG PO TABS
5.0000 mg | ORAL_TABLET | Freq: Once | ORAL | Status: AC | PRN
  Administered 2020-07-27: 5 mg via ORAL

## 2020-07-27 MED ORDER — ACETAMINOPHEN 10 MG/ML IV SOLN
INTRAVENOUS | Status: AC
  Filled 2020-07-27: qty 100

## 2020-07-27 MED ORDER — FENTANYL CITRATE (PF) 100 MCG/2ML IJ SOLN
INTRAMUSCULAR | Status: AC
Start: 2020-07-27 — End: ?
  Filled 2020-07-27: qty 2

## 2020-07-27 MED ORDER — BUPIVACAINE-EPINEPHRINE (PF) 0.5% -1:200000 IJ SOLN
INTRAMUSCULAR | Status: DC | PRN
  Administered 2020-07-27: 30 mL via PERINEURAL

## 2020-07-27 MED ORDER — DEXMEDETOMIDINE (PRECEDEX) IN NS 20 MCG/5ML (4 MCG/ML) IV SYRINGE
PREFILLED_SYRINGE | INTRAVENOUS | Status: AC
  Filled 2020-07-27: qty 5

## 2020-07-27 MED ORDER — GLYCOPYRROLATE 0.2 MG/ML IJ SOLN
INTRAMUSCULAR | Status: AC
  Filled 2020-07-27: qty 1

## 2020-07-27 MED ORDER — LACTATED RINGERS IV SOLN: INTRAVENOUS | Status: DC

## 2020-07-27 MED ORDER — OXYCODONE HCL 5 MG PO TABS
ORAL_TABLET | ORAL | Status: AC
  Filled 2020-07-27: qty 1

## 2020-07-27 MED ORDER — FENTANYL CITRATE (PF) 100 MCG/2ML IJ SOLN
INTRAMUSCULAR | Status: AC
  Filled 2020-07-27: qty 2

## 2020-07-27 MED ORDER — LIDOCAINE HCL (PF) 2 % IJ SOLN
INTRAMUSCULAR | Status: AC
  Filled 2020-07-27: qty 5

## 2020-07-27 MED ORDER — CEFAZOLIN SODIUM 1 G IJ SOLR
INTRAMUSCULAR | Status: AC
  Filled 2020-07-27: qty 20

## 2020-07-27 MED ORDER — METHYLENE BLUE 0.5 % INJ SOLN
INTRAVENOUS | Status: AC
  Filled 2020-07-27: qty 10

## 2020-07-27 MED ORDER — DEXAMETHASONE SODIUM PHOSPHATE 10 MG/ML IJ SOLN
INTRAMUSCULAR | Status: DC | PRN
  Administered 2020-07-27: 10 mg via INTRAVENOUS

## 2020-07-27 MED ORDER — DEXAMETHASONE SODIUM PHOSPHATE 10 MG/ML IJ SOLN
INTRAMUSCULAR | Status: AC
  Filled 2020-07-27: qty 1

## 2020-07-27 SURGICAL SUPPLY — 51 items
ADH SKN CLS APL DERMABOND .7 (GAUZE/BANDAGES/DRESSINGS) ×1
APL PRP STRL LF DISP 70% ISPRP (MISCELLANEOUS) ×1
BINDER BREAST LRG (GAUZE/BANDAGES/DRESSINGS) IMPLANT
BINDER BREAST MEDIUM (GAUZE/BANDAGES/DRESSINGS) IMPLANT
BINDER BREAST XLRG (GAUZE/BANDAGES/DRESSINGS) IMPLANT
BINDER BREAST XXLRG (GAUZE/BANDAGES/DRESSINGS) IMPLANT
BLADE SURG 15 STRL LF DISP TIS (BLADE) ×2 IMPLANT
BLADE SURG 15 STRL SS (BLADE) ×4
CANISTER SUCT 1200ML W/VALVE (MISCELLANEOUS) ×2 IMPLANT
CHLORAPREP W/TINT 26 (MISCELLANEOUS) ×2 IMPLANT
CNTNR SPEC 2.5X3XGRAD LEK (MISCELLANEOUS)
CONT SPEC 4OZ STER OR WHT (MISCELLANEOUS)
CONT SPEC 4OZ STRL OR WHT (MISCELLANEOUS)
CONTAINER SPEC 2.5X3XGRAD LEK (MISCELLANEOUS) IMPLANT
COVER WAND RF STERILE (DRAPES) ×2 IMPLANT
DERMABOND ADVANCED (GAUZE/BANDAGES/DRESSINGS) ×1
DERMABOND ADVANCED .7 DNX12 (GAUZE/BANDAGES/DRESSINGS) ×1 IMPLANT
DEVICE DUBIN SPECIMEN MAMMOGRA (MISCELLANEOUS) ×2 IMPLANT
DRAPE LAPAROTOMY TRNSV 106X77 (MISCELLANEOUS) ×2 IMPLANT
DRSG GAUZE FLUFF 36X18 (GAUZE/BANDAGES/DRESSINGS) IMPLANT
ELECT CAUTERY BLADE 6.4 (BLADE) ×2 IMPLANT
ELECT REM PT RETURN 9FT ADLT (ELECTROSURGICAL) ×2
ELECTRODE REM PT RTRN 9FT ADLT (ELECTROSURGICAL) ×1 IMPLANT
GLOVE BIO SURGEON STRL SZ 6.5 (GLOVE) ×2 IMPLANT
GLOVE BIOGEL PI IND STRL 6.5 (GLOVE) ×1 IMPLANT
GLOVE BIOGEL PI INDICATOR 6.5 (GLOVE) ×1
GOWN STRL REUS W/ TWL LRG LVL3 (GOWN DISPOSABLE) ×2 IMPLANT
GOWN STRL REUS W/TWL LRG LVL3 (GOWN DISPOSABLE) ×4
KIT MARKER MARGIN INK (KITS) ×2 IMPLANT
KIT TURNOVER KIT A (KITS) ×2 IMPLANT
LABEL OR SOLS (LABEL) ×2 IMPLANT
MARGIN MAP 10MM (MISCELLANEOUS) ×2 IMPLANT
MARKER MARGIN CORRECT CLIP (MARKER) ×2 IMPLANT
NEEDLE HYPO 22GX1.5 SAFETY (NEEDLE) ×2 IMPLANT
NEEDLE HYPO 25X1 1.5 SAFETY (NEEDLE) ×2 IMPLANT
PACK BASIN MINOR (MISCELLANEOUS) ×2 IMPLANT
RETRACTOR RING XSMALL (MISCELLANEOUS) ×1 IMPLANT
RTRCTR WOUND ALEXIS 13CM XS SH (MISCELLANEOUS) ×2
SET LOCALIZER 20 PROBE US (MISCELLANEOUS) ×2 IMPLANT
SLEVE PROBE SENORX GAMMA FIND (MISCELLANEOUS) ×2 IMPLANT
SUT ETHILON 3-0 FS-10 30 BLK (SUTURE) ×2
SUT MNCRL 4-0 (SUTURE) ×4
SUT MNCRL 4-0 27XMFL (SUTURE) ×2
SUT SILK 2 0 SH (SUTURE) ×2 IMPLANT
SUT VIC AB 3-0 SH 27 (SUTURE) ×8
SUT VIC AB 3-0 SH 27X BRD (SUTURE) ×4 IMPLANT
SUTURE EHLN 3-0 FS-10 30 BLK (SUTURE) ×1 IMPLANT
SUTURE MNCRL 4-0 27XMF (SUTURE) ×2 IMPLANT
SYR 10ML LL (SYRINGE) ×4 IMPLANT
SYR BULB IRRIG 60ML STRL (SYRINGE) ×2 IMPLANT
WATER STERILE IRR 1000ML POUR (IV SOLUTION) ×2 IMPLANT

## 2020-07-27 NOTE — Anesthesia Preprocedure Evaluation (Signed)
Anesthesia Evaluation  Patient identified by MRN, date of birth, ID band Patient awake    Reviewed: Allergy & Precautions, NPO status , Patient's Chart, lab work & pertinent test results  History of Anesthesia Complications Negative for: history of anesthetic complications  Airway Mallampati: II  TM Distance: >3 FB Neck ROM: Full    Dental no notable dental hx. (+) Teeth Intact   Pulmonary sleep apnea , neg COPD, Patient abstained from smoking.Not current smoker, former smoker,  Patient has had sleep study before, not on CPAP yet. Thinks she has OSA   Pulmonary exam normal breath sounds clear to auscultation       Cardiovascular Exercise Tolerance: Good METShypertension, (-) CAD and (-) Past MI (-) dysrhythmias  Rhythm:Regular Rate:Normal - Systolic murmurs    Neuro/Psych Gets balance problems after her stroke CVA, Residual Symptoms negative psych ROS   GI/Hepatic neg GERD  ,(+)     (-) substance abuse  ,   Endo/Other  neg diabetes  Renal/GU negative Renal ROS     Musculoskeletal   Abdominal   Peds  Hematology   Anesthesia Other Findings Past Medical History: No date: Arthritis No date: History of kidney stones 04/18/2020: History of stroke No date: Hypercholesteremia No date: Hypertension No date: Stroke [facility])  Reproductive/Obstetrics                             Anesthesia Physical Anesthesia Plan  ASA: II  Anesthesia Plan: General   Post-op Pain Management:    Induction: Intravenous  PONV Risk Score and Plan: 3 and Ondansetron, Dexamethasone and Midazolam  Airway Management Planned: LMA  Additional Equipment: None  Intra-op Plan:   Post-operative Plan: Extubation in OR  Informed Consent: I have reviewed the patients History and Physical, chart, labs and discussed the procedure including the risks, benefits and alternatives for the proposed anesthesia with the  patient or authorized representative who has indicated his/her understanding and acceptance.     Dental advisory given  Plan Discussed with: CRNA and Surgeon  Anesthesia Plan Comments: (Discussed risks of anesthesia with patient, including PONV, sore throat, lip/dental damage. Rare risks discussed as well, such as cardiorespiratory and neurological sequelae. Patient understands. Patient has listed allergy to PCN - unknown reaction in her 20's , decades ago, not life threatening; hives listed in Epic. Severe blistering skin reaction (SJS/TEN)? no Liver or kidney injury caused by PCN? no Hemolytic anemia from PCN? no Drug fever? no Painful swollen joints? no Severe reaction involving inside of mouth, eye, or genital ulcers? no Based on current evidence Elizebeth Koller et al, J Allergy Clin Immunol Pract, 2019), will proceed with cefazolin use: Yes  )        Anesthesia Quick Evaluation

## 2020-07-27 NOTE — Op Note (Signed)
Preoperative diagnosis: Left breast carcinoma.  Postoperative diagnosis: Left breast carcinoma.   Procedure: Left radiofrequency tag-localized partial mastectomy.                       Left Axillary Sentinel Lymph node biopsy  Anesthesia: GETA  Surgeon: Dr. Hazle Quant  Wound Classification: Clean  Indications: Patient is a 54 y.o. 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   Findings: 1. Specimen mammography shows marker and tag on specimen 2. Pathology call refers gross examination of margins was 2 mm close to anterior margin 3. No other palpable mass or lymph node identified.   Description of procedure: Preoperative radiofrequency tag localization was performed by radiology. In the nuclear medicine suite, the subareolar region was injected with Tc-99 sulfur colloid. Localization studies were reviewed. The patient was taken to the operating room and placed supine on the operating table, and after general anesthesia the left chest and axilla were prepped and draped in the usual sterile fashion. A time-out was completed verifying correct patient, procedure, site, positioning, and implant(s) and/or special equipment prior to beginning this procedure.  By comparing the localization studies and interrogation with Localizer device, the probable trajectory and location of the mass was visualized. A circumareolar skin incision was planned in such a way as to minimize the amount of dissection to reach the mass.  The skin incision was made. Flaps were raised and the location of the tag was confirmed with Localizer device confirmed. A 2-0 silk figure-of-eight stay suture was placed and used for retraction. Dissection was then taken down circumferentially, taking care to include the entire localizing tag and a  wide margin of grossly normal tissue. The specimen and entire localizing tag were removed. The specimen was oriented and sent to radiology with the localization studies. Confirmation was received that biopsy cavity was close to 2 mm from anterior margin. Re excision of the anterior margin was done.  The wound was irrigated. Hemostasis was checked.    A hand-held gamma probe was used to identify the location of the hottest spot in the axilla. An incision was made around the caudal axillary hairline. Dissection was carried down until subdermal facias was advanced. The probe was placed and again, the point of maximal count was found. Dissection continue until nodule was identified. The probe was placed in contact with the node. The node was excised in its entirety.  Two clinically abnormal nodes were palpated and excised. The procedure was terminated. Hemostasis was achieved and the wound closed in layers with deep interrupted 3-0 Vicryl and skin was closed with subcuticular suture of Monocryl 3-0.   The breast wound was then approached for closure.  Eliminate dead space a tissue transfer technique was utilized.  The breast and pectoralis fascia was elevated off the underlying muscle and the serratus muscle circumferentially for a distance of about 6 centimeters for an area of 36 sq cm.  The fascial layer was then approximated with interrupted 2-0 Vicryl sutures.  The superficial layer of the breast parenchyma was then approximated in a similar fashion.  This was done in a radial direction.  The skin flaps were then elevated circumferentially to remove a ripple noted superiorly and medially. The skin was closed with 4-0 Monocryl. Dermabond was applied.  The patient tolerated the procedure well and was taken to the  postanesthesia care unit in stable condition.   Sentinel Node Biopsy Synoptic Operative Report  Operation performed with curative intent:Yes  Tracer(s) used to identify sentinel nodes in the  upfront surgery (non-neoadjuvant) setting (select all that apply):Radioactive Tracer  Tracer(s) used to identify sentinel nodes in the neoadjuvant setting (select all that apply):N/A  All nodes (colored or non-colored) present at the end of a dye-filled lymphatic channel were removed:N/A  All significantly radioactive nodes were removed:Yes  All palpable suspicious nodes were removed:Yes  Biopsy-proven positive nodes marked with clips prior to chemotherapy were identified and removed:N/A   Specimen: Left Breast mass                     Sentinel Lymph node                    Two axillary lymph nodes  Complications: None  Estimated Blood Loss: 10 mL

## 2020-07-27 NOTE — Transfer of Care (Signed)
Immediate Anesthesia Transfer of Care Note  Patient: Lindsay Guzman  Procedure(s) Performed: PARTIAL MASTECTOMY WITH RADIO FREQUENCY LOCALIZER w/ Sentinel Node biopsy (Left Breast)  Patient Location: PACU  Anesthesia Type:General  Level of Consciousness: awake  Airway & Oxygen Therapy: Patient connected to face mask oxygen  Post-op Assessment: Post -op Vital signs reviewed and stable  Post vital signs: stable  Last Vitals:  Vitals Value Taken Time  BP 112/73 07/27/20 1130  Temp 36.4 C 07/27/20 1130  Pulse 88 07/27/20 1130  Resp 14 07/27/20 1131  SpO2 100 % 07/27/20 1130  Vitals shown include unvalidated device data.  Last Pain:  Vitals:   07/27/20 0720  TempSrc: Oral  PainSc:          Complications: No complications documented.

## 2020-07-27 NOTE — Progress Notes (Signed)
   07/27/20 0730  Clinical Encounter Type  Visited With Family  Visit Type Initial  Referral From Chaplain  Consult/Referral To Chaplain  while rounding SDS waiting, chaplain briefly spoke with Pt's husband to find out how he was doing while waiting. He did not have any questions or concerns.

## 2020-07-27 NOTE — Interval H&P Note (Signed)
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  The various methods of treatment have been discussed with the patient and family. After consideration of risks, benefits and other options for treatment, the patient has consented to  Procedure(s): PARTIAL MASTECTOMY WITH RADIO FREQUENCY LOCALIZER w/ Sentinel Node biopsy (Left) as a surgical intervention.  The patient's history has been reviewed, patient examined, no change in status, stable for surgery.  I have reviewed the patient's chart and labs.  Left breast marked in the pre procedure room. Questions were answered to the patient's satisfaction.     Carolan Shiver

## 2020-07-27 NOTE — Discharge Instructions (Signed)
  Diet: Resume home heart healthy regular diet.   Activity: Increase activity slowly as tolerated. Light activity and walking are encouraged. Do not drive or drink alcohol if taking narcotic pain medications.  Wound care: May shower with soapy water and pat dry (do not rub incisions), but no baths or submerging incision underwater until follow-up. (no swimming)   Medications: Resume all home medications. For mild to moderate pain: acetaminophen (Tylenol) or ibuprofen (if no kidney disease). Combining Tylenol with alcohol can substantially increase your risk of causing liver disease. Narcotic pain medications, if prescribed, can be used for severe pain, though may cause nausea, constipation, and drowsiness. Do not combine Tylenol and Norco within a 6 hour period as Norco contains Tylenol. If you do not need the narcotic pain medication, you do not need to fill the prescription.  Call office [phone]) at any time if any questions, worsening pain, fevers/chills, bleeding, drainage from incision site, or other concerns.   AMBULATORY SURGERY  DISCHARGE INSTRUCTIONS   1) The drugs that you were given will stay in your system until tomorrow so for the next 24 hours you should not:  A) Drive an automobile B) Make any legal decisions C) Drink any alcoholic beverage   2) You may resume regular meals tomorrow.  Today it is better to start with liquids and gradually work up to solid foods.  You may eat anything you prefer, but it is better to start with liquids, then soup and crackers, and gradually work up to solid foods.   3) Please notify your doctor immediately if you have any unusual bleeding, trouble breathing, redness and pain at the surgery site, drainage, fever, or pain not relieved by medication.    4) Additional Instructions:        Please contact your physician with any problems or Same Day Surgery at [phone], Monday through Friday 6 am to 4 pm, or [place] at  [facility] number at [phone].

## 2020-07-27 NOTE — Anesthesia Procedure Notes (Signed)
Procedure Name: LMA Insertion Date/Time: 07/27/2020 9:26 AM Performed by: Irving Burton, CRNA Pre-anesthesia Checklist: Patient identified, Emergency Drugs available, Suction available and Patient being monitored Patient Re-evaluated:Patient Re-evaluated prior to induction Oxygen Delivery Method: Circle system utilized Preoxygenation: Pre-oxygenation with 100% oxygen Induction Type: IV induction Ventilation: Mask ventilation without difficulty LMA: LMA inserted LMA Size: 3.5 Number of attempts: 1 Placement Confirmation: ETT inserted through vocal cords under direct vision,  positive ETCO2 and breath sounds checked- equal and bilateral Tube secured with: Tape Dental Injury: Teeth and Oropharynx as per pre-operative assessment

## 2020-07-28 ENCOUNTER — Telehealth: Payer: Self-pay

## 2020-07-28 NOTE — Anesthesia Postprocedure Evaluation (Signed)
Anesthesia Post Note  Patient: Lindsay Guzman  Procedure(s) Performed: PARTIAL MASTECTOMY WITH RADIO FREQUENCY LOCALIZER w/ Sentinel Node biopsy (Left Breast)  Patient location during evaluation: PACU Anesthesia Type: General Level of consciousness: awake and alert Pain management: pain level controlled Vital Signs Assessment: post-procedure vital signs reviewed and stable Respiratory status: spontaneous breathing, nonlabored ventilation, respiratory function stable and patient connected to nasal cannula oxygen Cardiovascular status: blood pressure returned to baseline and stable Postop Assessment: no apparent nausea or vomiting Anesthetic complications: no   No complications documented.   Last Vitals:  Vitals:   07/27/20 1215 07/27/20 1232  BP: 111/79 137/85  Pulse: 86 89  Resp: 18 18  Temp:  36.6 C  SpO2: 96% 100%    Last Pain:  Vitals:   07/27/20 1232  TempSrc: Temporal  PainSc: 2                  Yevette Edwards

## 2020-07-28 NOTE — Telephone Encounter (Signed)
Done...  Pt has been scheduled as requested. Pt is aware. 

## 2020-07-28 NOTE — Telephone Encounter (Signed)
Patient had surgery (partial mastecotmy- left breast) on 10/13. Please schedule MD follow up 2 weeks after and inform pt of appt. Thanks

## 2020-08-01 ENCOUNTER — Other Ambulatory Visit: Payer: Self-pay | Admitting: Anatomic Pathology & Clinical Pathology

## 2020-08-03 LAB — SURGICAL PATHOLOGY

## 2020-08-09 ENCOUNTER — Ambulatory Visit: Payer: Self-pay | Admitting: General Surgery

## 2020-08-09 NOTE — Pre-Procedure Instructions (Signed)
Talked to pt on the phone- verified pt not taking aspirin or Plavix per her MD instruction. Reminded pt of number to call day before surgery to get arrival time. Reminded pt to follow previous presurgery instruction including NPO after midnight night before surgery and shower night before surgery and morning of surgery. Pt voiced understanding. CHG instructions and bottle of soap to be given 08/10/2020 at covid testing appt.

## 2020-08-10 ENCOUNTER — Encounter: Payer: Self-pay | Admitting: Oncology

## 2020-08-10 ENCOUNTER — Inpatient Hospital Stay: Payer: No Typology Code available for payment source

## 2020-08-10 ENCOUNTER — Other Ambulatory Visit: Payer: Self-pay

## 2020-08-10 ENCOUNTER — Inpatient Hospital Stay (HOSPITAL_BASED_OUTPATIENT_CLINIC_OR_DEPARTMENT_OTHER): Payer: No Typology Code available for payment source | Admitting: Oncology

## 2020-08-10 ENCOUNTER — Other Ambulatory Visit
Admission: RE | Admit: 2020-08-10 | Discharge: 2020-08-10 | Disposition: A | Discharge status: Home / Self Care | Payer: No Typology Code available for payment source | Source: Ambulatory Visit | Attending: General Surgery | Admitting: General Surgery

## 2020-08-10 VITALS — BP 94/61 | HR 105 | Temp 98.9°F | Resp 18 | Wt 179.3 lb

## 2020-08-10 VITALS — BP 126/81 | HR 95

## 2020-08-10 DIAGNOSIS — Z8673 Personal history of transient ischemic attack (TIA), and cerebral infarction without residual deficits: Secondary | ICD-10-CM | POA: Diagnosis not present

## 2020-08-10 DIAGNOSIS — N6489 Other specified disorders of breast: Secondary | ICD-10-CM | POA: Diagnosis not present

## 2020-08-10 DIAGNOSIS — D0512 Intraductal carcinoma in situ of left breast: Secondary | ICD-10-CM

## 2020-08-10 DIAGNOSIS — Z01812 Encounter for preprocedural laboratory examination: Secondary | ICD-10-CM | POA: Insufficient documentation

## 2020-08-10 DIAGNOSIS — R0602 Shortness of breath: Secondary | ICD-10-CM | POA: Diagnosis not present

## 2020-08-10 DIAGNOSIS — N644 Mastodynia: Secondary | ICD-10-CM | POA: Diagnosis not present

## 2020-08-10 DIAGNOSIS — I9589 Other hypotension: Secondary | ICD-10-CM

## 2020-08-10 DIAGNOSIS — R5383 Other fatigue: Secondary | ICD-10-CM | POA: Diagnosis not present

## 2020-08-10 DIAGNOSIS — Z170 Estrogen receptor positive status [ER+]: Secondary | ICD-10-CM | POA: Diagnosis not present

## 2020-08-10 DIAGNOSIS — Z803 Family history of malignant neoplasm of breast: Secondary | ICD-10-CM | POA: Diagnosis not present

## 2020-08-10 DIAGNOSIS — D72829 Elevated white blood cell count, unspecified: Secondary | ICD-10-CM | POA: Diagnosis not present

## 2020-08-10 DIAGNOSIS — Z801 Family history of malignant neoplasm of trachea, bronchus and lung: Secondary | ICD-10-CM | POA: Diagnosis not present

## 2020-08-10 DIAGNOSIS — Z79899 Other long term (current) drug therapy: Secondary | ICD-10-CM | POA: Diagnosis not present

## 2020-08-10 DIAGNOSIS — Z87442 Personal history of urinary calculi: Secondary | ICD-10-CM | POA: Diagnosis not present

## 2020-08-10 DIAGNOSIS — E861 Hypovolemia: Secondary | ICD-10-CM

## 2020-08-10 DIAGNOSIS — Z20822 Contact with and (suspected) exposure to covid-19: Secondary | ICD-10-CM | POA: Insufficient documentation

## 2020-08-10 LAB — CBC WITH DIFFERENTIAL/PLATELET
Abs Immature Granulocytes: 0.03 10*3/uL (ref 0.00–0.07)
Basophils Absolute: 0.1 10*3/uL (ref 0.0–0.1)
Basophils Relative: 1 %
Eosinophils Absolute: 0.2 10*3/uL (ref 0.0–0.5)
Eosinophils Relative: 1 %
HCT: 32.6 % — ABNORMAL LOW (ref 36.0–46.0)
Hemoglobin: 10.9 g/dL — ABNORMAL LOW (ref 12.0–15.0)
Immature Granulocytes: 0 %
Lymphocytes Relative: 22 %
Lymphs Abs: 2.6 10*3/uL (ref 0.7–4.0)
MCH: 28.5 pg (ref 26.0–34.0)
MCHC: 33.4 g/dL (ref 30.0–36.0)
MCV: 85.3 fL (ref 80.0–100.0)
Monocytes Absolute: 0.7 10*3/uL (ref 0.1–1.0)
Monocytes Relative: 6 %
Neutro Abs: 8.1 10*3/uL — ABNORMAL HIGH (ref 1.7–7.7)
Neutrophils Relative %: 70 %
Platelets: 463 10*3/uL — ABNORMAL HIGH (ref 150–400)
RBC: 3.82 MIL/uL — ABNORMAL LOW (ref 3.87–5.11)
RDW: 13.3 % (ref 11.5–15.5)
WBC: 11.6 10*3/uL — ABNORMAL HIGH (ref 4.0–10.5)
nRBC: 0 % (ref 0.0–0.2)

## 2020-08-10 LAB — COMPREHENSIVE METABOLIC PANEL
ALT: 17 U/L (ref 0–44)
AST: 22 U/L (ref 15–41)
Albumin: 4.4 g/dL (ref 3.5–5.0)
Alkaline Phosphatase: 71 U/L (ref 38–126)
Anion gap: 11 (ref 5–15)
BUN: 14 mg/dL (ref 6–20)
CO2: 26 mmol/L (ref 22–32)
Calcium: 9.4 mg/dL (ref 8.9–10.3)
Chloride: 98 mmol/L (ref 98–111)
Creatinine, Ser: 0.9 mg/dL (ref 0.44–1.00)
GFR, Estimated: >60 mL/min (ref >60)
Glucose, Bld: 92 mg/dL (ref 70–99)
Potassium: 3.3 mmol/L — ABNORMAL LOW (ref 3.5–5.1)
Sodium: 135 mmol/L (ref 135–145)
Total Bilirubin: 0.7 mg/dL (ref 0.3–1.2)
Total Protein: 8.6 g/dL — ABNORMAL HIGH (ref 6.5–8.1)

## 2020-08-10 LAB — SARS CORONAVIRUS 2 (TAT 6-24 HRS): SARS Coronavirus 2: NEGATIVE

## 2020-08-10 MED ORDER — SODIUM CHLORIDE 0.9 % IV SOLN
Freq: Once | INTRAVENOUS | Status: AC
  Filled 2020-08-10: qty 250

## 2020-08-10 MED ORDER — SULFAMETHOXAZOLE-TRIMETHOPRIM 800-160 MG PO TABS: 1.0000 | ORAL_TABLET | Freq: Two times a day (BID) | ORAL | 0 refills | Status: DC

## 2020-08-10 NOTE — Progress Notes (Signed)
Patient has left breast pain (surgical site) is 6/10 today.  She is scheduled for another surgery with Dr. Maia Plan on Friday.  Does have SOBr on exertion.

## 2020-08-10 NOTE — Progress Notes (Signed)
Hematology/Oncology Consult note [facility] Telephone:([phone] Fax:([phone]   Patient Care Team: Sherron Monday, MD as PCP - General (Internal Medicine) Scarlett Presto, RN as Oncology Nurse Navigator (Oncology)  REFERRING PROVIDER: Sherron Monday, MD CHIEF COMPLAINTS/REASON FOR VISIT:  Evaluation of breast cancer  HISTORY OF PRESENTING ILLNESS:  Lindsay Guzman is a  54 y.o.  female with PMH listed below who was referred to me for evaluation of breast cancer  Patient had bilateral diagnostic mammogram and ultrasound on 06/09/2020.  Patient previously had mammogram in 2012 and at that time right breast mass in the left breast mass for recommended.  Patient had left breast biopsy but did not proceed with right breast biopsy. 06/09/2020 bilateral diagnostic mammogram showed Biopsy pathology showed: Indeterminate medial left breast calcifications spanning 2.3 cm.  No other suspicious findings.  Medial right breast mass has been stable since 2012. 06/17/2020 patient underwent stereotactic biopsy of left breast calcifications.  Biopsy showed high-grade DCIS with comedonecrosis.  Associated with calcification.   Family history of breast cancer: Breast cancer in 3 maternal aunts Family history of other cancers: Sister had lung cancer  Menarche: 12 Menopause: history of hysterotomy.  Number of pregnancies : 3 children Age at first live childbirth:16 Used OCP: <10 years use Used estrogen and progesterone therapy: denies History of Radiation to the chest: deneis Previous of breast biopsy:   INTERVAL HISTORY Lindsay Guzman is a 54 y.o. female who has above history reviewed by me today presents for follow up visit for management of left breast high-grade DCIS Problems and complaints are listed below: 07/27/2020, patient proceed with left breast lumpectomy.  Due to the high-grade DCIS, patient open to do sentinel lymph node biopsy at the same time of  lumpectomy surgery. Pathology showed left breast high-grade DCIS, with comedonecrosis, negative for invasive component.  No lymph node involvement.  Closest DCIS margin was 0.5 mm.  ER positive Today patient reports feeling left breast heavy and warm.  No fever, chills.  She has pain 6 out of 10.  She tried narcotic pain medication prescribed by Dr. Maia Plan but felt too drowsy afterwards. She takes Tylenol as needed. Denies any lightheadedness.  Has not been eating well due to the breast discomfort.  Chronic shortness of breath with exertion.  Currently no shortness of breath at resting  Review of Systems  Constitutional: Positive for fatigue. Negative for appetite change, chills and fever.  HENT:   Negative for hearing loss and voice change.   Eyes: Negative for eye problems.  Respiratory: Negative for chest tightness, cough and shortness of breath.   Cardiovascular: Negative for chest pain.  Gastrointestinal: Negative for abdominal distention, abdominal pain and blood in stool.  Endocrine: Negative for hot flashes.  Genitourinary: Negative for difficulty urinating and frequency.   Musculoskeletal: Negative for arthralgias.  Skin: Negative for itching and rash.  Neurological: Negative for extremity weakness.  Hematological: Negative for adenopathy.  Psychiatric/Behavioral: Negative for confusion.  Breast heaviness and warmth.  Pain   MEDICAL HISTORY:  Past Medical History:  Diagnosis Date  . Arthritis   . History of kidney stones   . History of stroke 04/18/2020  . Hypercholesteremia   . Hypertension   . Stroke [facility])     SURGICAL HISTORY: Past Surgical History:  Procedure Laterality Date  . ABDOMINAL HYSTERECTOMY    . BREAST BIOPSY Left ?   neg  . BREAST BIOPSY Left 06/17/2020   stereo bx, x-clip, path pending  .  CHOLECYSTECTOMY      SOCIAL HISTORY: Social History   Socioeconomic History  . Marital status: Married    Spouse name: Not on file  . Number of children:  Not on file  . Years of education: Not on file  . Highest education level: Not on file  Occupational History  . Not on file  Tobacco Use  . Smoking status: Former Smoker    Types: Cigarettes    Quit date: 04/18/2020    Years since quitting: 0.3  . Smokeless tobacco: Never Used  Substance and Sexual Activity  . Alcohol use: Yes    Comment: occasionally   . Drug use: Yes    Types: Marijuana  . Sexual activity: Not on file  Other Topics Concern  . Not on file  Social History Narrative  . Not on file   Social Determinants of Health   Financial Resource Strain:   . Difficulty of Paying Living Expenses: Not on file  Food Insecurity:   . Worried About Programme researcher, broadcasting/film/videounning Out of Food in the Last Year: Not on file  . Ran Out of Food in the Last Year: Not on file  Transportation Needs:   . Lack of Transportation (Medical): Not on file  . Lack of Transportation (Non-Medical): Not on file  Physical Activity:   . Days of Exercise per Week: Not on file  . Minutes of Exercise per Session: Not on file  Stress:   . Feeling of Stress : Not on file  Social Connections:   . Frequency of Communication with Friends and Family: Not on file  . Frequency of Social Gatherings with Friends and Family: Not on file  . Attends Religious Services: Not on file  . Active Member of Clubs or Organizations: Not on file  . Attends BankerClub or Organization Meetings: Not on file  . Marital Status: Not on file  Intimate Partner Violence:   . Fear of Current or Ex-Partner: Not on file  . Emotionally Abused: Not on file  . Physically Abused: Not on file  . Sexually Abused: Not on file    FAMILY HISTORY: Family History  Problem Relation Age of Onset  . Breast cancer Maternal Aunt   . Lung cancer Sister   . Breast cancer Maternal Aunt   . Breast cancer Maternal Aunt     ALLERGIES:  is allergic to other and penicillins.  MEDICATIONS:  Current Outpatient Medications  Medication Sig Dispense Refill  . acetaminophen  (TYLENOL) 500 MG tablet Take 500 mg by mouth every 6 (six) hours as needed.    Marland Kitchen. amLODipine (NORVASC) 10 MG tablet Take by mouth.     . clopidogrel (PLAVIX) 75 MG tablet Take by mouth.     . hydrALAZINE (APRESOLINE) 100 MG tablet Take by mouth.    . Multiple Vitamins-Minerals (CENTRUM SILVER ADULT 50+ PO) Take 1 tablet by mouth daily.    Marland Kitchen. olmesartan-hydrochlorothiazide (BENICAR HCT) 40-25 MG tablet Take 1 tablet by mouth every morning.    . rosuvastatin (CRESTOR) 20 MG tablet Take by mouth.    Marland Kitchen. aspirin EC 81 MG tablet Take 81 mg by mouth daily. Swallow whole. (Patient not taking: Reported on 08/10/2020)    . sulfamethoxazole-trimethoprim (BACTRIM DS) 800-160 MG tablet Take 1 tablet by mouth 2 (two) times daily. 10 tablet 0   No current facility-administered medications for this visit.     PHYSICAL EXAMINATION: ECOG PERFORMANCE STATUS: 0 - Asymptomatic Vitals:   08/10/20 0932  BP: 94/61  Pulse: (!) 105  Resp: 18  Temp: 98.9 F (37.2 C)  SpO2: 100%   Filed Weights   08/10/20 0932  Weight: 179 lb 4.8 oz (81.3 kg)    Physical Exam Constitutional:      General: She is not in acute distress.    Appearance: She is not diaphoretic.  HENT:     Head: Normocephalic and atraumatic.     Nose: Nose normal.     Mouth/Throat:     Pharynx: No oropharyngeal exudate.  Eyes:     General: No scleral icterus.    Pupils: Pupils are equal, round, and reactive to light.  Cardiovascular:     Rate and Rhythm: Normal rate and regular rhythm.     Heart sounds: No murmur heard.   Pulmonary:     Effort: Pulmonary effort is normal. No respiratory distress.     Breath sounds: No rales.  Chest:     Chest wall: No tenderness.  Abdominal:     General: There is no distension.     Palpations: Abdomen is soft.     Tenderness: There is no abdominal tenderness.  Musculoskeletal:        General: Normal range of motion.     Cervical back: Normal range of motion and neck supple.  Skin:    General:  Skin is warm and dry.     Findings: No erythema.  Neurological:     Mental Status: She is alert and oriented to person, place, and time.     Cranial Nerves: No cranial nerve deficit.     Motor: No abnormal muscle tone.     Coordination: Coordination normal.  Psychiatric:        Mood and Affect: Affect normal.      Left breast status post lumpectomy.  Significant swelling of the left breast, skin warmth.  No discharge around the wound.    LABORATORY DATA:  I have reviewed the data as listed Lab Results  Component Value Date   WBC 11.6 (H) 08/10/2020   HGB 10.9 (L) 08/10/2020   HCT 32.6 (L) 08/10/2020   MCV 85.3 08/10/2020   PLT 463 (H) 08/10/2020   Recent Labs    04/18/20 0913 04/18/20 0913 04/19/20 0857 04/20/20 0438 04/20/20 0438 04/21/20 0827 06/28/20 1544 08/10/20 1033  NA 138   < > 137 139  --   --  138 135  K 3.3*   < > 3.1* 3.8   < > 3.4* 3.6 3.3*  CL 102   < > 100 104  --   --  97* 98  CO2 26   < > 24 27  --   --  30 26  GLUCOSE 109*   < > 122* 97  --   --  86 92  BUN 13   < > 16 13  --   --  16 14  CREATININE 0.87   < > 0.91 0.83  --   --  0.75 0.90  CALCIUM 8.9   < > 8.8* 8.8*  --   --  9.9 9.4  GFRNONAA >60   < > >60 >60  --   --  >60 >60  GFRAA >60   < > >60 >60  --   --  >60  --   PROT 7.9  --   --   --   --   --  8.3* 8.6*  ALBUMIN 4.2   < >  --  3.6  --   --  4.5 4.4  AST 19  --   --   --   --   --  26 22  ALT 12  --   --   --   --   --  20 17  ALKPHOS 72  --   --   --   --   --  57 71  BILITOT 0.8  --   --   --   --   --  0.5 0.7   < > = values in this interval not displayed.   Iron/TIBC/Ferritin/ %Sat No results found for: IRON, TIBC, FERRITIN, IRONPCTSAT      ASSESSMENT & PLAN:  1. Ductal carcinoma in situ (DCIS) of left breast   2. Family history of breast cancer   3. Pain of left breast   4. Hypotension due to hypovolemia   5. Seroma of breast   6. Leukocytosis, unspecified type    Left breast high-grade DCIS, ER positive, status  post lumpectomy and optional sentinel lymph node biopsy. pTis pN0.  DCIS margin is less than 2 mm. Patient's case was reviewed and discussed on breast tumor board on 08/08/2020.  Recommend reexcision to achieve negative margin. After that, recommend adjuvant radiation followed by adjuvant endocrine therapy.  #Post surgery seroma, patient's left breast physical examination indicates possible seroma,  She also has skin warmth and tenderness.  Rule out abscess. I checked CBC and a CMP.  Mild leukocytosis. I discussed with Dr. Maia Plan over the phone today.  We agree on reexcision of the DCIS as well as plan of drainage of seroma.  Patient has surgery scheduled in 2 days. I will cover patient with Bactrim twice daily for 5 days. I recommend patient to use Tylenol alternating with ibuprofen for pain control.  If pain is not relieved, I asked her to communicate with Dr. Maia Plan for other narcotic options.  #Hypotension due to hypovolemia. Patient denies any dizziness or lightheaded, occasionally off balance.  She took off her 3 blood pressure medication this morning. She admits to poor oral intake. Patient will receive 500 cc of IV fluid normal saline today.  Encourage oral hydration.   Family history of breast cancer, I have referred her to genetic counselor   Orders Placed This Encounter  Procedures  . Culture, blood (routine x 2)    Standing Status:   Future    Number of Occurrences:   1    Standing Expiration Date:   08/10/2021  . Culture, blood (Routine X 2) w Reflex to ID Panel    Standing Status:   Future    Number of Occurrences:   1    Standing Expiration Date:   08/10/2021  . CBC with Differential/Platelet    Standing Status:   Future    Number of Occurrences:   1    Standing Expiration Date:   08/10/2021  . Comprehensive metabolic panel    Standing Status:   Future    Number of Occurrences:   1    Standing Expiration Date:   08/10/2021    All questions were answered. The  patient knows to call the clinic with any problems questions or concerns.  Return of visit: To be determined.  Pending on reexcision pathology.  [name], MD, PhD Hematology Oncology [facility] Pager- [phone] 10/27/2021g provider

## 2020-08-12 ENCOUNTER — Other Ambulatory Visit: Payer: Self-pay

## 2020-08-12 ENCOUNTER — Ambulatory Visit: Payer: PRIVATE HEALTH INSURANCE | Admitting: Anesthesiology

## 2020-08-12 ENCOUNTER — Ambulatory Visit
Admission: RE | Admit: 2020-08-12 | Discharge: 2020-08-12 | Disposition: A | Discharge status: Home / Self Care | Payer: PRIVATE HEALTH INSURANCE | Attending: General Surgery | Admitting: General Surgery

## 2020-08-12 ENCOUNTER — Encounter
Admission: RE | Disposition: A | Discharge status: Home / Self Care | Payer: Self-pay | Source: Home / Self Care | Attending: General Surgery

## 2020-08-12 ENCOUNTER — Encounter: Payer: Self-pay | Admitting: General Surgery

## 2020-08-12 DIAGNOSIS — Z880 Allergy status to penicillin: Secondary | ICD-10-CM | POA: Diagnosis not present

## 2020-08-12 DIAGNOSIS — L7632 Postprocedural hematoma of skin and subcutaneous tissue following other procedure: Secondary | ICD-10-CM | POA: Insufficient documentation

## 2020-08-12 DIAGNOSIS — D0512 Intraductal carcinoma in situ of left breast: Secondary | ICD-10-CM | POA: Diagnosis not present

## 2020-08-12 DIAGNOSIS — Z803 Family history of malignant neoplasm of breast: Secondary | ICD-10-CM | POA: Insufficient documentation

## 2020-08-12 DIAGNOSIS — F17200 Nicotine dependence, unspecified, uncomplicated: Secondary | ICD-10-CM | POA: Diagnosis not present

## 2020-08-12 HISTORY — PX: MASTECTOMY, PARTIAL: SHX709

## 2020-08-12 LAB — URINE DRUG SCREEN, QUALITATIVE (ARMC ONLY)
Amphetamines, Ur Screen: NOT DETECTED
Barbiturates, Ur Screen: NOT DETECTED
Benzodiazepine, Ur Scrn: NOT DETECTED
Cannabinoid 50 Ng, Ur ~~LOC~~: POSITIVE — AB
Cocaine Metabolite,Ur ~~LOC~~: NOT DETECTED
MDMA (Ecstasy)Ur Screen: NOT DETECTED
Methadone Scn, Ur: NOT DETECTED
Opiate, Ur Screen: NOT DETECTED
Phencyclidine (PCP) Ur S: NOT DETECTED
Tricyclic, Ur Screen: NOT DETECTED

## 2020-08-12 SURGERY — MASTECTOMY PARTIAL
Anesthesia: General | Site: Breast | Laterality: Left

## 2020-08-12 MED ORDER — PROMETHAZINE HCL 25 MG/ML IJ SOLN: 6.2500 mg | INTRAMUSCULAR | Status: DC | PRN

## 2020-08-12 MED ORDER — MIDAZOLAM HCL 2 MG/2ML IJ SOLN
INTRAMUSCULAR | Status: AC
  Filled 2020-08-12: qty 2

## 2020-08-12 MED ORDER — MIDAZOLAM HCL 2 MG/2ML IJ SOLN
INTRAMUSCULAR | Status: DC | PRN
  Administered 2020-08-12: 2 mg via INTRAVENOUS

## 2020-08-12 MED ORDER — HYDROCODONE-ACETAMINOPHEN 5-325 MG PO TABS: 1.0000 | ORAL_TABLET | ORAL | 0 refills | Status: AC | PRN

## 2020-08-12 MED ORDER — CEFAZOLIN SODIUM-DEXTROSE 2-4 GM/100ML-% IV SOLN
INTRAVENOUS | Status: AC
  Filled 2020-08-12: qty 100

## 2020-08-12 MED ORDER — ONDANSETRON HCL 4 MG/2ML IJ SOLN
INTRAMUSCULAR | Status: DC | PRN
  Administered 2020-08-12: 4 mg via INTRAVENOUS

## 2020-08-12 MED ORDER — LIDOCAINE HCL (PF) 2 % IJ SOLN
INTRAMUSCULAR | Status: AC
  Filled 2020-08-12: qty 5

## 2020-08-12 MED ORDER — PHENYLEPHRINE HCL (PRESSORS) 10 MG/ML IV SOLN
INTRAVENOUS | Status: DC | PRN
  Administered 2020-08-12: 100 ug via INTRAVENOUS
  Administered 2020-08-12: 200 ug via INTRAVENOUS

## 2020-08-12 MED ORDER — LIDOCAINE HCL (CARDIAC) PF 100 MG/5ML IV SOSY
PREFILLED_SYRINGE | INTRAVENOUS | Status: DC | PRN
  Administered 2020-08-12: 80 mg via INTRAVENOUS

## 2020-08-12 MED ORDER — VASOPRESSIN 20 UNIT/ML IV SOLN
INTRAVENOUS | Status: AC
  Filled 2020-08-12: qty 1

## 2020-08-12 MED ORDER — PROPOFOL 10 MG/ML IV BOLUS
INTRAVENOUS | Status: AC
  Filled 2020-08-12: qty 20

## 2020-08-12 MED ORDER — PHENYLEPHRINE HCL (PRESSORS) 10 MG/ML IV SOLN
INTRAVENOUS | Status: AC
  Filled 2020-08-12: qty 1

## 2020-08-12 MED ORDER — DEXAMETHASONE SODIUM PHOSPHATE 10 MG/ML IJ SOLN
INTRAMUSCULAR | Status: DC | PRN
  Administered 2020-08-12: 5 mg via INTRAVENOUS

## 2020-08-12 MED ORDER — PROPOFOL 10 MG/ML IV BOLUS
INTRAVENOUS | Status: DC | PRN
  Administered 2020-08-12: 160 mg via INTRAVENOUS

## 2020-08-12 MED ORDER — FENTANYL CITRATE (PF) 100 MCG/2ML IJ SOLN
INTRAMUSCULAR | Status: DC | PRN
  Administered 2020-08-12: 50 ug via INTRAVENOUS

## 2020-08-12 MED ORDER — ONDANSETRON HCL 4 MG/2ML IJ SOLN
INTRAMUSCULAR | Status: AC
  Filled 2020-08-12: qty 2

## 2020-08-12 MED ORDER — VASOPRESSIN 20 UNIT/ML IV SOLN
INTRAVENOUS | Status: DC | PRN
  Administered 2020-08-12: 1 [IU] via INTRAVENOUS
  Administered 2020-08-12: .5 [IU] via INTRAVENOUS
  Administered 2020-08-12: 1 [IU] via INTRAVENOUS

## 2020-08-12 MED ORDER — FENTANYL CITRATE (PF) 100 MCG/2ML IJ SOLN: 25.0000 ug | INTRAMUSCULAR | Status: DC | PRN

## 2020-08-12 MED ORDER — CHLORHEXIDINE GLUCONATE 0.12 % MT SOLN
15.0000 mL | Freq: Once | OROMUCOSAL | Status: AC
  Administered 2020-08-12: 15 mL via OROMUCOSAL

## 2020-08-12 MED ORDER — FAMOTIDINE 20 MG PO TABS: 20.0000 mg | ORAL_TABLET | Freq: Once | ORAL | Status: DC

## 2020-08-12 MED ORDER — LACTATED RINGERS IV SOLN: INTRAVENOUS | Status: DC

## 2020-08-12 MED ORDER — CHLORHEXIDINE GLUCONATE 0.12 % MT SOLN
OROMUCOSAL | Status: AC
  Filled 2020-08-12: qty 15

## 2020-08-12 MED ORDER — ORAL CARE MOUTH RINSE: 15.0000 mL | Freq: Once | OROMUCOSAL | Status: AC

## 2020-08-12 MED ORDER — DEXAMETHASONE SODIUM PHOSPHATE 10 MG/ML IJ SOLN
INTRAMUSCULAR | Status: AC
  Filled 2020-08-12: qty 1

## 2020-08-12 MED ORDER — CEFAZOLIN SODIUM-DEXTROSE 2-4 GM/100ML-% IV SOLN
2.0000 g | INTRAVENOUS | Status: AC
  Administered 2020-08-12: 2 g via INTRAVENOUS

## 2020-08-12 MED ORDER — FENTANYL CITRATE (PF) 100 MCG/2ML IJ SOLN
INTRAMUSCULAR | Status: AC
  Filled 2020-08-12: qty 2

## 2020-08-12 SURGICAL SUPPLY — 49 items
ADH SKN CLS APL DERMABOND .7 (GAUZE/BANDAGES/DRESSINGS) ×1
APL PRP STRL LF DISP 70% ISPRP (MISCELLANEOUS) ×1
BLADE SURG 15 STRL LF DISP TIS (BLADE) ×1 IMPLANT
BLADE SURG 15 STRL SS (BLADE) ×2
CANISTER SUCT 1200ML W/VALVE (MISCELLANEOUS) ×2 IMPLANT
CHLORAPREP W/TINT 26 (MISCELLANEOUS) ×2 IMPLANT
CNTNR SPEC 2.5X3XGRAD LEK (MISCELLANEOUS) ×1
CONT SPEC 4OZ STER OR WHT (MISCELLANEOUS) ×1
CONT SPEC 4OZ STRL OR WHT (MISCELLANEOUS) ×1
CONTAINER SPEC 2.5X3XGRAD LEK (MISCELLANEOUS) ×1 IMPLANT
COVER WAND RF STERILE (DRAPES) ×2 IMPLANT
DERMABOND ADVANCED (GAUZE/BANDAGES/DRESSINGS) ×1
DERMABOND ADVANCED .7 DNX12 (GAUZE/BANDAGES/DRESSINGS) ×1 IMPLANT
DEVICE DUBIN SPECIMEN MAMMOGRA (MISCELLANEOUS) IMPLANT
DRAPE LAPAROTOMY TRNSV 106X77 (MISCELLANEOUS) ×2 IMPLANT
ELECT CAUTERY BLADE TIP 2.5 (TIP) ×2
ELECT REM PT RETURN 9FT ADLT (ELECTROSURGICAL) ×2
ELECTRODE CAUTERY BLDE TIP 2.5 (TIP) ×1 IMPLANT
ELECTRODE REM PT RTRN 9FT ADLT (ELECTROSURGICAL) ×1 IMPLANT
GLOVE BIO SURGEON STRL SZ 6.5 (GLOVE) ×2 IMPLANT
GLOVE BIOGEL PI IND STRL 6.5 (GLOVE) ×1 IMPLANT
GLOVE BIOGEL PI INDICATOR 6.5 (GLOVE) ×1
GOWN STRL REUS W/ TWL LRG LVL3 (GOWN DISPOSABLE) ×3 IMPLANT
GOWN STRL REUS W/TWL LRG LVL3 (GOWN DISPOSABLE) ×6
HEMOSTAT ARISTA ABSORB 3G PWDR (HEMOSTASIS) ×4 IMPLANT
KIT MARKER MARGIN INK (KITS) IMPLANT
KIT TURNOVER KIT A (KITS) ×2 IMPLANT
LABEL OR SOLS (LABEL) ×2 IMPLANT
MARGIN MAP 10MM (MISCELLANEOUS) ×2 IMPLANT
MARKER MARGIN CORRECT CLIP (MARKER) IMPLANT
NEEDLE HYPO 25X1 1.5 SAFETY (NEEDLE) ×2 IMPLANT
PACK BASIN MINOR (MISCELLANEOUS) ×2 IMPLANT
RETRACTOR RING XSMALL (MISCELLANEOUS) IMPLANT
RTRCTR WOUND ALEXIS 13CM XS SH (MISCELLANEOUS)
SPONGE LAP 18X18 RF (DISPOSABLE) ×2 IMPLANT
SUT ETHILON 3-0 FS-10 30 BLK (SUTURE) ×2
SUT MNCRL 4-0 (SUTURE) ×2
SUT MNCRL 4-0 27XMFL (SUTURE) ×1
SUT PROLENE 2 0 FS (SUTURE) ×2 IMPLANT
SUT PROLENE 2 0 SH DA (SUTURE) ×2 IMPLANT
SUT SILK 2 0 SH (SUTURE) ×2 IMPLANT
SUT VIC AB 3-0 SH 27 (SUTURE) ×2
SUT VIC AB 3-0 SH 27X BRD (SUTURE) ×1 IMPLANT
SUTURE EHLN 3-0 FS-10 30 BLK (SUTURE) ×1 IMPLANT
SUTURE MNCRL 4-0 27XMF (SUTURE) ×1 IMPLANT
SYR 10ML LL (SYRINGE) ×2 IMPLANT
SYR BULB IRRIG 60ML STRL (SYRINGE) ×2 IMPLANT
TRAY PREP VAG/GEN (MISCELLANEOUS) ×2 IMPLANT
WATER STERILE IRR 1000ML POUR (IV SOLUTION) ×2 IMPLANT

## 2020-08-12 NOTE — Discharge Instructions (Signed)

## 2020-08-12 NOTE — Anesthesia Postprocedure Evaluation (Signed)
Anesthesia Post Note  Patient: [name]  Procedure(s) Performed: RE-EXCISION OF INFERIOR MARGIN OF LEFT BREAST, DRAINAGE OF SEROMA OF LEFT BREAST (Left Breast)  Patient location during evaluation: PACU Anesthesia Type: General Level of consciousness: awake and alert Pain management: pain level controlled Vital Signs Assessment: post-procedure vital signs reviewed and stable Respiratory status: spontaneous breathing, nonlabored ventilation, respiratory function stable and patient connected to nasal cannula oxygen Cardiovascular status: blood pressure returned to baseline and stable Postop Assessment: no apparent nausea or vomiting Anesthetic complications: no   No complications documented.   Last Vitals:  Vitals:   08/12/20 1238 08/12/20 1309  BP: 123/83 105/76  Pulse: 100 92  Resp: 15 14  Temp: (!) 36.3 C   SpO2: 96% 99%    Last Pain:  Vitals:   08/12/20 1309  TempSrc:   PainSc: 0-No pain                 Lenard Simmer

## 2020-08-12 NOTE — Interval H&P Note (Signed)
History and Physical Interval Note:  08/12/2020 10:08 AM  Lindsay Guzman  has presented today for surgery, with the diagnosis of D05.12 DCIS lt breast -- N64.89 Seroma of breast.  The various methods of treatment have been discussed with the patient and family. After consideration of risks, benefits and other options for treatment, the patient has consented to  Procedure(s): MASTECTOMY PARTIAL (Left), re excision of inferior margin as a surgical intervention.  The patient's history has been reviewed, patient examined, no change in status, stable for surgery.  I have reviewed the patient's chart and labs.  Questions were answered to the patient's satisfaction.     Carolan Shiver

## 2020-08-12 NOTE — Anesthesia Procedure Notes (Signed)
Procedure Name: LMA Insertion Date/Time: 08/12/2020 11:00 AM Performed by: Clyde Lundborg, CRNA Pre-anesthesia Checklist: Patient identified, Emergency Drugs available, Suction available and Patient being monitored Patient Re-evaluated:Patient Re-evaluated prior to induction Oxygen Delivery Method: Circle system utilized Preoxygenation: Pre-oxygenation with 100% oxygen Induction Type: IV induction Ventilation: Mask ventilation without difficulty LMA: LMA inserted LMA Size: 4.0 Tube type: Oral Number of attempts: 1 Placement Confirmation: ETT inserted through vocal cords under direct vision,  positive ETCO2,  breath sounds checked- equal and bilateral and CO2 detector Tube secured with: Tape Dental Injury: Teeth and Oropharynx as per pre-operative assessment

## 2020-08-12 NOTE — Transfer of Care (Signed)
Immediate Anesthesia Transfer of Care Note  Patient: Lindsay Guzman  Procedure(s) Performed: RE-EXCISION OF INFERIOR MARGIN OF LEFT BREAST, DRAINAGE OF SEROMA OF LEFT BREAST (Left Breast)  Patient Location: PACU  Anesthesia Type:General  Level of Consciousness: awake, alert  and oriented  Airway & Oxygen Therapy: Patient Spontanous Breathing  Post-op Assessment: Report given to RN and Post -op Vital signs reviewed and stable  Post vital signs: Reviewed and stable  Last Vitals:  Vitals Value Taken Time  BP    Temp    Pulse    Resp    SpO2      Last Pain:  Vitals:   08/12/20 0944  TempSrc: Oral  PainSc: 4          Complications: No complications documented.

## 2020-08-12 NOTE — Op Note (Signed)
Preoperative diagnosis: Left breast DCIS with close margin                                             Seroma of left breast  Postoperative diagnosis: Left breast DCIS with close margin                                               Large hematoma of left breast  Procedure: Left partial mastectomy.                       Drainage of hematoma of left breast  Anesthesia: GETA  Surgeon: Dr. Hazle Quant  Wound Classification: Clean  Indications: Patient is a 54 y.o. female with left breast DCIS s/p partial mastectomy. Inferior margin was 0.5 mm from the DCIS. Patient also with a tense and painful suspected seroma that needed to be drained.   Findings: 1. Large hematoma of the left breast with abundant amount of clots that needed to be drained surgically.  2. Adequate hemostasis at the end of the case.  3. No other palpable mass or lymph node identified.   Description of procedure: The patient was taken to the operating room and placed supine on the operating table, and after general anesthesia the left chest was prepped and draped in the usual sterile fashion. A time-out was completed verifying correct patient, procedure, site, positioning, and implant(s) and/or special equipment prior to beginning this procedure.  The previous incision was opened. Abundant amount of clots were identified and drained. The cavity was irrigated with 2 L of warm saline.  The inferior margin was grabbed with Alice clamps. With electrocautery the inferior margin was resected. The specimen was oriented and sent to pathology. The wound was irrigated again. Hemostasis was checked. The wound was closed with interrupted sutures 2-0 Prolene. No attempt was made to close the dead space.   Specimen: Left Breast inferior margin                      Complications: None  Estimated Blood Loss: 67mL

## 2020-08-12 NOTE — Anesthesia Preprocedure Evaluation (Signed)
Anesthesia Evaluation  Patient identified by MRN, date of birth, ID band Patient awake    Reviewed: Allergy & Precautions, NPO status , Patient's Chart, lab work & pertinent test results  History of Anesthesia Complications Negative for: history of anesthetic complications  Airway Mallampati: II  TM Distance: >3 FB Neck ROM: Full    Dental no notable dental hx. (+) Teeth Intact   Pulmonary sleep apnea , neg COPD, Patient abstained from smoking.Not current smoker, former smoker,  Patient has had sleep study before, not on CPAP yet. Thinks she has OSA   Pulmonary exam normal breath sounds clear to auscultation       Cardiovascular Exercise Tolerance: Good METShypertension, (-) CAD and (-) Past MI (-) dysrhythmias  Rhythm:Regular Rate:Normal - Systolic murmurs    Neuro/Psych Gets balance problems after her stroke CVA, Residual Symptoms negative psych ROS   GI/Hepatic neg GERD  ,(+)     (-) substance abuse  ,   Endo/Other  neg diabetes  Renal/GU negative Renal ROS     Musculoskeletal   Abdominal   Peds  Hematology   Anesthesia Other Findings Past Medical History: No date: Arthritis No date: History of kidney stones 04/18/2020: History of stroke No date: Hypercholesteremia No date: Hypertension No date: Stroke (HCC)  Reproductive/Obstetrics                             Anesthesia Physical Anesthesia Plan  ASA: II  Anesthesia Plan: General   Post-op Pain Management:    Induction: Intravenous  PONV Risk Score and Plan: 3 and Ondansetron, Dexamethasone and Midazolam  Airway Management Planned: LMA  Additional Equipment: None  Intra-op Plan:   Post-operative Plan: Extubation in OR  Informed Consent: I have reviewed the patients History and Physical, chart, labs and discussed the procedure including the risks, benefits and alternatives for the proposed anesthesia with the  patient or authorized representative who has indicated his/her understanding and acceptance.     Dental advisory given  Plan Discussed with: CRNA and Surgeon  Anesthesia Plan Comments: (Discussed risks of anesthesia with patient, including PONV, sore throat, lip/dental damage. Rare risks discussed as well, such as cardiorespiratory and neurological sequelae. Patient understands. Patient has listed allergy to PCN - unknown reaction in her 20's , decades ago, not life threatening; hives listed in Epic. Severe blistering skin reaction (SJS/TEN)? no Liver or kidney injury caused by PCN? no Hemolytic anemia from PCN? no Drug fever? no Painful swollen joints? no Severe reaction involving inside of mouth, eye, or genital ulcers? no Based on current evidence (Kuruvilla et al, J Allergy Clin Immunol Pract, 2019), will proceed with cefazolin use: Yes  )        Anesthesia Quick Evaluation  

## 2020-08-13 ENCOUNTER — Encounter: Payer: Self-pay | Admitting: General Surgery

## 2020-08-15 LAB — CULTURE, BLOOD (ROUTINE X 2)
Culture: NO GROWTH
Culture: NO GROWTH
Special Requests: ADEQUATE
Special Requests: ADEQUATE

## 2020-08-16 ENCOUNTER — Other Ambulatory Visit: Payer: Self-pay

## 2020-08-16 DIAGNOSIS — D0512 Intraductal carcinoma in situ of left breast: Secondary | ICD-10-CM

## 2020-08-16 LAB — SURGICAL PATHOLOGY

## 2020-08-16 NOTE — Progress Notes (Signed)
Patient reports she is feeling much better post-op.  Per Dr. Bethanne Ginger request consult scheduled with Dr. Rushie Chestnut on 08/31/20 at 11:00. Patient notified.

## 2020-08-17 ENCOUNTER — Other Ambulatory Visit: Payer: Self-pay

## 2020-08-17 DIAGNOSIS — D0512 Intraductal carcinoma in situ of left breast: Secondary | ICD-10-CM

## 2020-08-17 DIAGNOSIS — Z803 Family history of malignant neoplasm of breast: Secondary | ICD-10-CM

## 2020-08-18 ENCOUNTER — Telehealth: Payer: Self-pay | Admitting: *Deleted

## 2020-08-18 NOTE — Telephone Encounter (Signed)
Nurse Case Manager Lorina Rabon J called asking for treatment plan and update post surgery, she would be the one to do authorizations for chemotherapy and or radiation therapy Please fax records to [phone]

## 2020-08-19 NOTE — Telephone Encounter (Signed)
Last office note with Dr. Cathie Hoops has been faxed.

## 2020-08-31 ENCOUNTER — Ambulatory Visit
Admission: RE | Admit: 2020-08-31 | Discharge: 2020-08-31 | Disposition: A | Discharge status: Home / Self Care | Payer: PRIVATE HEALTH INSURANCE | Source: Ambulatory Visit | Attending: Radiation Oncology | Admitting: Radiation Oncology

## 2020-08-31 ENCOUNTER — Inpatient Hospital Stay: Payer: No Typology Code available for payment source | Attending: Oncology | Admitting: Licensed Clinical Social Worker

## 2020-08-31 ENCOUNTER — Encounter: Payer: Self-pay | Admitting: Licensed Clinical Social Worker

## 2020-08-31 ENCOUNTER — Inpatient Hospital Stay: Payer: No Typology Code available for payment source

## 2020-08-31 DIAGNOSIS — Z801 Family history of malignant neoplasm of trachea, bronchus and lung: Secondary | ICD-10-CM

## 2020-08-31 DIAGNOSIS — I10 Essential (primary) hypertension: Secondary | ICD-10-CM | POA: Diagnosis not present

## 2020-08-31 DIAGNOSIS — Z170 Estrogen receptor positive status [ER+]: Secondary | ICD-10-CM | POA: Diagnosis not present

## 2020-08-31 DIAGNOSIS — D0512 Intraductal carcinoma in situ of left breast: Secondary | ICD-10-CM | POA: Diagnosis present

## 2020-08-31 DIAGNOSIS — M129 Arthropathy, unspecified: Secondary | ICD-10-CM | POA: Insufficient documentation

## 2020-08-31 DIAGNOSIS — F17210 Nicotine dependence, cigarettes, uncomplicated: Secondary | ICD-10-CM | POA: Diagnosis not present

## 2020-08-31 DIAGNOSIS — E7800 Pure hypercholesterolemia, unspecified: Secondary | ICD-10-CM | POA: Diagnosis not present

## 2020-08-31 DIAGNOSIS — Z7982 Long term (current) use of aspirin: Secondary | ICD-10-CM | POA: Insufficient documentation

## 2020-08-31 DIAGNOSIS — Z8673 Personal history of transient ischemic attack (TIA), and cerebral infarction without residual deficits: Secondary | ICD-10-CM | POA: Diagnosis not present

## 2020-08-31 DIAGNOSIS — Z87442 Personal history of urinary calculi: Secondary | ICD-10-CM | POA: Insufficient documentation

## 2020-08-31 DIAGNOSIS — Z79899 Other long term (current) drug therapy: Secondary | ICD-10-CM | POA: Insufficient documentation

## 2020-08-31 DIAGNOSIS — Z803 Family history of malignant neoplasm of breast: Secondary | ICD-10-CM | POA: Insufficient documentation

## 2020-08-31 NOTE — Progress Notes (Signed)
REFERRING PROVIDER: Earlie Server, MD [place],  [place] [address]  PRIMARY PROVIDER:  Jodi Marble, MD  PRIMARY REASON FOR VISIT:  1. Ductal carcinoma in situ (DCIS) of left breast   2. Family history of breast cancer   3. Family history of lung cancer      HISTORY OF PRESENT ILLNESS:   Lindsay Guzman, a 54 y.o. female, was seen for a [facility] cancer genetics consultation at the request of Dr. Tasia Catchings due to a personal and family history of breast cancer.  Lindsay Guzman presents to clinic today to discuss the possibility of a hereditary predisposition to cancer, genetic testing, and to further clarify her future cancer risks, as well as potential cancer risks for family members.   In 2021, at the age of 46, Lindsay Guzman was diagnosed with DCIS of the left breast.  The treatment plan includes lumpectomy, which she had in October, and adjuvant radiation and endocrine therapy.   CANCER HISTORY:  Oncology History   No history exists.     RISK FACTORS:  Menarche was at age 70.  First live birth at age 19.  OCP use for approximately less than 10 years.  Ovaries intact: no.  Hysterectomy: yes- partial.  HRT use: 0 years. Colonoscopy: no; not examined. Mammogram within the last year: yes. Number of breast biopsies: 2. Up to date with pelvic exams: yes. Any excessive radiation exposure in the past: no  Past Medical History:  Diagnosis Date  . Arthritis   . Family history of breast cancer   . Family history of lung cancer   . History of kidney stones   . History of stroke 04/18/2020  . Hypercholesteremia   . Hypertension   . Stroke [facility])     Past Surgical History:  Procedure Laterality Date  . ABDOMINAL HYSTERECTOMY    . BREAST BIOPSY Left ?   neg  . BREAST BIOPSY Left 06/17/2020   stereo bx, x-clip, path pending  . CHOLECYSTECTOMY    . MASTECTOMY, PARTIAL Left 08/12/2020   Procedure: RE-EXCISION OF INFERIOR MARGIN OF LEFT BREAST, DRAINAGE OF SEROMA OF LEFT BREAST;   Surgeon: Herbert Pun, MD;  Location: [facility] ORS;  Service: General;  Laterality: Left;    Social History   Socioeconomic History  . Marital status: Married    Spouse name: Not on file  . Number of children: Not on file  . Years of education: Not on file  . Highest education level: Not on file  Occupational History  . Not on file  Tobacco Use  . Smoking status: Former Smoker    Types: Cigarettes    Quit date: 04/18/2020    Years since quitting: 0.3  . Smokeless tobacco: Never Used  Substance and Sexual Activity  . Alcohol use: Yes    Comment: occasionally   . Drug use: Yes    Types: Marijuana  . Sexual activity: Not on file  Other Topics Concern  . Not on file  Social History Narrative  . Not on file   Social Determinants of Health   Financial Resource Strain:   . Difficulty of Paying Living Expenses: Not on file  Food Insecurity:   . Worried About Charity fundraiser in the Last Year: Not on file  . Ran Out of Food in the Last Year: Not on file  Transportation Needs:   . Lack of Transportation (Medical): Not on file  . Lack of Transportation (Non-Medical): Not on file  Physical Activity:   .  Days of Exercise per Week: Not on file  . Minutes of Exercise per Session: Not on file  Stress:   . Feeling of Stress : Not on file  Social Connections:   . Frequency of Communication with Friends and Family: Not on file  . Frequency of Social Gatherings with Friends and Family: Not on file  . Attends Religious Services: Not on file  . Active Member of Clubs or Organizations: Not on file  . Attends Archivist Meetings: Not on file  . Marital Status: Not on file     FAMILY HISTORY:  We obtained a detailed, 4-generation family history.  Significant diagnoses are listed below: Family History  Problem Relation Age of Onset  . Breast cancer Maternal Aunt   . Breast cancer Maternal Aunt   . Breast cancer Maternal Aunt   . Lung cancer Maternal Uncle   .  Cancer Paternal Aunt        unk type  . Lung cancer Maternal Aunt     Lindsay Guzman has 1 son and 2 daughters, no cancers. She has 3 sisters and 1 paternal half brother. None of them have had cancer.  Lindsay Guzman mother is 73, no history of cancer. Patient had 6 maternal uncles, 4 maternal aunts. Three aunts had breast cancer in their 68s. An aunt and an uncle both had lung cancer and history of smoking. No known cancers in maternal cousins. Maternal grandmother passed at 64 and grandfather passed at 56.   Lindsay Guzman father is living at 74, no cancers. Patient has 2 paternal aunts. One had cancer in her 21s and died from it, unknown type. She also had a son die from cancer, unknown type. Paternal grandmother died in her 30s, grandfather died in his 29s.   Lindsay Guzman is unaware of previous family history of genetic testing for hereditary cancer risks. Patient's maternal ancestors are of African American descent, and paternal ancestors are of African American descent. There is no reported Ashkenazi Jewish ancestry. There is no known consanguinity.   GENETIC COUNSELING ASSESSMENT: Lindsay Guzman is a 54 y.o. female with a personal and family history of breast cancer which is somewhat suggestive of a hereditary cancer syndrome and predisposition to cancer. We, therefore, discussed and recommended the following at today's visit.   DISCUSSION: We discussed that approximately 5-10% of breast cancer is hereditary. Most cases of hereditary breast cancer are associated with BRCA1/BRCA2 genes, although there are other genes associated with hereditary breast cancer as well including PALB2, CHEK2, ATM .  We discussed that testing is beneficial for several reasons including knowing about other cancer risks, identifying potential screening and risk-reduction options that may be appropriate, and to understand if other family members could be at risk for cancer and allow them to undergo genetic testing.   We reviewed the  characteristics, features and inheritance patterns of hereditary cancer syndromes. We also discussed genetic testing, including the appropriate family members to test, the process of testing, insurance coverage and turn-around-time for results. We discussed the implications of a negative, positive and/or variant of uncertain significant result. We recommended Lindsay Guzman pursue genetic testing for the [facility] CustomNext+RNA gene panel.   The CustomNext-Cancer + RNAinsight panel  includes sequencing and/or deletion duplication testing of the following 91 genes: AIP, ALK, APC*, ATM*, AXIN2, BAP1, BARD1, BLM, BMPR1A, BRCA1*, BRCA2*, BRIP1*, CDC73, CDH1*, CDK4, CDKN1B, CDKN2A, CHEK2*, CTNNA1, DICER1, FANCC, FH, FLCN, GALNT12, KIF1B, LZTR1, MAX, MEN1, MET, MLH1*, MRE11A, MSH2*, MSH3, MSH6*, MUTYH*, NBN,  NF1*, NF2, NTHL1, PALB2*, PHOX2B, PMS2*, POT1, PRKAR1A, PTCH1, PTEN*, RAD50, RAD51C*, RAD51D*, RB1, RECQL, RET, SDHA, SDHAF2, SDHB, SDHC, SDHD, SMAD4, SMARCA4, SMARCB1, SMARCE1, STK11, SUFU, TMEM127, TP53*, TSC1, TSC2, VHL and XRCC2 (sequencing and deletion/duplication); CASR, CFTR, CPA1, CTRC, EGFR, EGLN1, FAM175A, HOXB13, KIT, MITF, MLH3, PALLD, PDGFRA, POLD1, POLE, PRSS1, RINT1, RPS20, SPINK1 and TERT (sequencing only); EPCAM and GREM1 (deletion/duplication only).   Based on Lindsay Guzman's personal and family history of cancer, she meets medical criteria for genetic testing. Despite that she meets criteria, she may still have an out of pocket cost. We discussed that if her out of pocket cost for testing is over $100, the laboratory will call and confirm whether she wants to proceed with testing.  If the out of pocket cost of testing is less than $100 she will be billed by the genetic testing laboratory.   PLAN: After considering the risks, benefits, and limitations, Lindsay Guzman provided informed consent to pursue genetic testing and the blood sample was sent to [facility] for analysis of the CustomNext+RNA panel.  Results should be available within approximately 2-3 weeks' time, at which point they will be disclosed by telephone to Lindsay Guzman, as will any additional recommendations warranted by these results. Lindsay Guzman will receive a summary of her genetic counseling visit and a copy of her results once available. This information will also be available in Epic.   Lindsay Guzman questions were answered to her satisfaction today. Our contact information was provided should additional questions or concerns arise. Thank you for the referral and allowing [place] to share in the care of your patient.   Faith Rogue, MS, [facility] Genetic Counselor [email or web address] Phone: [phone]  The patient was seen for a total of 35 minutes in face-to-face genetic counseling. Patient's mother was present as well. Prospective genetic counseling student Lindsay Guzman was also present.  Dr. Grayland Ormond was available for discussion regarding this case.   _______________________________________________________________________ For Office Staff:  Number of people involved in session: 3 Was an Intern/ student involved with case: yes

## 2020-08-31 NOTE — Consult Note (Signed)
NEW PATIENT EVALUATION  Name: Lindsay Guzman  MRN: [id number]  Date:   08/31/2020     DOB: 02-24-1966   This 54 y.o. female patient presents to the clinic for initial evaluation of stage 0 (Tis N0 M0) ductal carcinoma in situ of the left breast status post wide local excision and sentinel biopsy as well as reexcision.  ER positive  REFERRING PHYSICIAN: Sherron Monday, MD  CHIEF COMPLAINT:  Chief Complaint  Patient presents with  . DCIS    Initial consultation    DIAGNOSIS: The encounter diagnosis was Ductal carcinoma in situ (DCIS) of left breast.   PREVIOUS INVESTIGATIONS:  Mammogram and ultrasound reviewed Clinical notes reviewed Pathology report reviewed  HPI: Patient is a 54 year old female who presented with an abnormal mammogram of her left breast showing indeterminate medial left breast calcifications spanning 2.3 cm.  Biopsy was positive for ER positive ductal carcinoma in situ.  MRI showed biopsy clip artifact and postbiopsy change in the side of inner left breast.  No other suspicious abnormalities were noted.  She underwent a wide local excision for 2.1 cm of high-grade DCIS with margin close at 0.5 mm.  Sentinel node biopsy was also performed showing 3 nonsentinel nodes and 1 sentinel node all negative for malignancy.  She underwent reexcision which was negative for residual ductal carcinoma in situ negative for invasive carcinoma.  She is been slow to heal still has an open wound present in her right lateral aspect of her left breast.  She is seeing surgeon tomorrow for recommendations.  She is otherwise doing well.  PLANNED TREATMENT REGIMEN: Left hypofractionated whole breast radiation  PAST MEDICAL HISTORY:  has a past medical history of Arthritis, Family history of breast cancer, Family history of lung cancer, History of kidney stones, History of stroke (04/18/2020), Hypercholesteremia, Hypertension, and Stroke (HCC).    PAST SURGICAL HISTORY:  Past Surgical  History:  Procedure Laterality Date  . ABDOMINAL HYSTERECTOMY    . BREAST BIOPSY Left ?   neg  . BREAST BIOPSY Left 06/17/2020   stereo bx, x-clip, path pending  . CHOLECYSTECTOMY    . MASTECTOMY, PARTIAL Left 08/12/2020   Procedure: RE-EXCISION OF INFERIOR MARGIN OF LEFT BREAST, DRAINAGE OF SEROMA OF LEFT BREAST;  Surgeon: Carolan Shiver, MD;  Location: [facility] ORS;  Service: General;  Laterality: Left;    FAMILY HISTORY: family history includes Breast cancer in her maternal aunt, maternal aunt, and maternal aunt; Cancer in her paternal aunt; Lung cancer in her maternal aunt and maternal uncle.  SOCIAL HISTORY:  reports that she quit smoking about 4 months ago. Her smoking use included cigarettes. She has never used smokeless tobacco. She reports current alcohol use. She reports current drug use. Drug: Marijuana.  ALLERGIES: Other and Penicillins  MEDICATIONS:  Current Outpatient Medications  Medication Sig Dispense Refill  . acetaminophen (TYLENOL) 500 MG tablet Take 500 mg by mouth every 6 (six) hours as needed.    Marland Kitchen amLODipine (NORVASC) 10 MG tablet Take by mouth.     Marland Kitchen aspirin EC 81 MG tablet Take 81 mg by mouth daily. Swallow whole.     . clopidogrel (PLAVIX) 75 MG tablet Take by mouth.     . hydrALAZINE (APRESOLINE) 100 MG tablet Take by mouth.    . Multiple Vitamins-Minerals (CENTRUM SILVER ADULT 50+ PO) Take 1 tablet by mouth daily.    Marland Kitchen olmesartan-hydrochlorothiazide (BENICAR HCT) 40-25 MG tablet Take 1 tablet by mouth every morning.    . rosuvastatin (CRESTOR)  20 MG tablet Take by mouth.     No current facility-administered medications for this encounter.    ECOG PERFORMANCE STATUS:  0 - Asymptomatic  REVIEW OF SYSTEMS: Patient denies any weight loss, fatigue, weakness, fever, chills or night sweats. Patient denies any loss of vision, blurred vision. Patient denies any ringing  of the ears or hearing loss. No irregular heartbeat. Patient denies heart murmur or  history of fainting. Patient denies any chest pain or pain radiating to her upper extremities. Patient denies any shortness of breath, difficulty breathing at night, cough or hemoptysis. Patient denies any swelling in the lower legs. Patient denies any nausea vomiting, vomiting of blood, or coffee ground material in the vomitus. Patient denies any stomach pain. Patient states has had normal bowel movements no significant constipation or diarrhea. Patient denies any dysuria, hematuria or significant nocturia. Patient denies any problems walking, swelling in the joints or loss of balance. Patient denies any skin changes, loss of hair or loss of weight. Patient denies any excessive worrying or anxiety or significant depression. Patient denies any problems with insomnia. Patient denies excessive thirst, polyuria, polydipsia. Patient denies any swollen glands, patient denies easy bruising or easy bleeding. Patient denies any recent infections, allergies or URI. Patient "s visual fields have not changed significantly in recent time.   PHYSICAL EXAM: BP (!) (P) 147/98 (BP Location: Left Arm, Patient Position: Sitting)   Pulse (P) 85   Temp (!) (P) 97.2 F (36.2 C) (Tympanic)   Resp (P) 16   Wt (P) 173 lb 9.6 oz (78.7 kg)   BMI (P) 29.34 kg/m  Wide local excision scar is still open.  Sutures are present although there is not close approximation of the scar.  No other dominant masses noted in the right breast.  No axillary or supraclavicular adenopathy is appreciated.  Well-developed well-nourished patient in NAD. HEENT reveals PERLA, EOMI, discs not visualized.  Oral cavity is clear. No oral mucosal lesions are identified. Neck is clear without evidence of cervical or supraclavicular adenopathy. Lungs are clear to A&P. Cardiac examination is essentially unremarkable with regular rate and rhythm without murmur rub or thrill. Abdomen is benign with no organomegaly or masses noted. Motor sensory and DTR levels are  equal and symmetric in the upper and lower extremities. Cranial nerves II through XII are grossly intact. Proprioception is intact. No peripheral adenopathy or edema is identified. No motor or sensory levels are noted. Crude visual fields are within normal range.  LABORATORY DATA: Pathology report reviewed    RADIOLOGY RESULTS: Mammogram ultrasound and MRI scans reviewed compatible with above-stated findings   IMPRESSION: Present time patient needs healing of this wound.  She is seeing the surgeon tomorrow for recommendations.  I put offer CT simulation about 2 weeks I have recommended a hypofractionated course of whole breast radiation over 4 weeks.  Risks and benefits of treatment including skin reaction fatigue alteration of blood counts possible occlusion of superficial lung all were described in detail to the patient.  She will also be candidate for antiestrogen therapy after completion of radiation.  We will reevaluate her wound in about 2 weeks at the time of simulation and may have to postpone again should we not see sufficient healing.  Patient and her mother both comprehend the recommendations well.  I would like to take this opportunity to thank you for allowing me to participate in the care of your patient.Marland Kitchen  PLAN: As above  Carmina Miller, MD

## 2020-09-14 ENCOUNTER — Ambulatory Visit
Admission: RE | Admit: 2020-09-14 | Discharge: 2020-09-14 | Disposition: A | Discharge status: Home / Self Care | Payer: PRIVATE HEALTH INSURANCE | Source: Ambulatory Visit | Attending: Radiation Oncology | Admitting: Radiation Oncology

## 2020-09-14 DIAGNOSIS — D0512 Intraductal carcinoma in situ of left breast: Secondary | ICD-10-CM | POA: Insufficient documentation

## 2020-09-14 DIAGNOSIS — Z510 Encounter for antineoplastic radiation therapy: Secondary | ICD-10-CM | POA: Insufficient documentation

## 2020-09-15 DIAGNOSIS — Z510 Encounter for antineoplastic radiation therapy: Secondary | ICD-10-CM | POA: Diagnosis not present

## 2020-09-21 ENCOUNTER — Encounter: Payer: Self-pay | Admitting: Licensed Clinical Social Worker

## 2020-09-21 ENCOUNTER — Telehealth: Payer: Self-pay | Admitting: Licensed Clinical Social Worker

## 2020-09-21 ENCOUNTER — Other Ambulatory Visit: Payer: Self-pay | Admitting: *Deleted

## 2020-09-21 ENCOUNTER — Ambulatory Visit: Payer: Self-pay | Admitting: Licensed Clinical Social Worker

## 2020-09-21 DIAGNOSIS — Z1379 Encounter for other screening for genetic and chromosomal anomalies: Secondary | ICD-10-CM | POA: Insufficient documentation

## 2020-09-21 DIAGNOSIS — D0512 Intraductal carcinoma in situ of left breast: Secondary | ICD-10-CM

## 2020-09-21 DIAGNOSIS — Z803 Family history of malignant neoplasm of breast: Secondary | ICD-10-CM

## 2020-09-21 DIAGNOSIS — Z801 Family history of malignant neoplasm of trachea, bronchus and lung: Secondary | ICD-10-CM

## 2020-09-21 NOTE — Telephone Encounter (Signed)
Revealed negative genetic testing. This normal result is reassuring and indicates that it is unlikely Lindsay Guzman's cancer is due to a hereditary cause.  It is unlikely that there is an increased risk of another cancer due to a mutation in one of these genes.  However, genetic testing is not perfect, and cannot definitively rule out a hereditary cause.  It will be important for her to keep in contact with genetics to learn if any additional testing may be needed in the future.

## 2020-09-21 NOTE — Progress Notes (Signed)
HPI:  Lindsay Guzman was previously seen in the [facility] clinic due to a personal and family history of cancer and concerns regarding a hereditary predisposition to cancer. Please refer to our prior cancer genetics clinic note for more information regarding our discussion, assessment and recommendations, at the time. Lindsay Guzman recent genetic test results were disclosed to her, as were recommendations warranted by these results. These results and recommendations are discussed in more detail below.  CANCER HISTORY:  Oncology History   No history exists.    FAMILY HISTORY:  We obtained a detailed, 4-generation family history.  Significant diagnoses are listed below: Family History  Problem Relation Age of Onset  . Breast cancer Maternal Aunt   . Breast cancer Maternal Aunt   . Breast cancer Maternal Aunt   . Lung cancer Maternal Uncle   . Cancer Paternal Aunt        unk type  . Lung cancer Maternal Aunt      Lindsay Guzman has 1 son and 2 daughters, no cancers. She has 3 sisters and 1 paternal half brother. None of them have had cancer.  Lindsay Guzman mother is 68, no history of cancer. Patient had 6 maternal uncles, 4 maternal aunts. Three aunts had breast cancer in their 36s. An aunt and an uncle both had lung cancer and history of smoking. No known cancers in maternal cousins. Maternal grandmother passed at 26 and grandfather passed at 98.   Lindsay Guzman father is living at 60, no cancers. Patient has 2 paternal aunts. One had cancer in her 56s and died from it, unknown type. She also had a son die from cancer, unknown type. Paternal grandmother died in her 60s, grandfather died in his 28s.   Lindsay Guzman is unaware of previous family history of genetic testing for hereditary cancer risks. Patient's maternal ancestors are of African American descent, and paternal ancestors are of African American descent. There is no reported Ashkenazi Jewish ancestry. There is no known  consanguinity.    GENETIC TEST RESULTS: Genetic testing reported out on 09/21/2020 through the [facility] CustomNext+RNA cancer panel found no pathogenic mutations.   The CustomNext-Cancer + RNAinsight panel  includes sequencing and/or deletion duplication testing of the following 91 genes: AIP, ALK, APC*, ATM*, AXIN2, BAP1, BARD1, BLM, BMPR1A, BRCA1*, BRCA2*, BRIP1*, CDC73, CDH1*, CDK4, CDKN1B, CDKN2A, CHEK2*, CTNNA1, DICER1, FANCC, FH, FLCN, GALNT12, KIF1B, LZTR1, MAX, MEN1, MET, MLH1*, MRE11A, MSH2*, MSH3, MSH6*, MUTYH*, NBN, NF1*, NF2, NTHL1, PALB2*, PHOX2B, PMS2*, POT1, PRKAR1A, PTCH1, PTEN*, RAD50, RAD51C*, RAD51D*, RB1, RECQL, RET, SDHA, SDHAF2, SDHB, SDHC, SDHD, SMAD4, SMARCA4, SMARCB1, SMARCE1, STK11, SUFU, TMEM127, TP53*, TSC1, TSC2, VHL and XRCC2 (sequencing and deletion/duplication); CASR, CFTR, CPA1, CTRC, EGFR, EGLN1, FAM175A, HOXB13, KIT, MITF, MLH3, PALLD, PDGFRA, POLD1, POLE, PRSS1, RINT1, RPS20, SPINK1 and TERT (sequencing only); EPCAM and GREM1 (deletion/duplication only).   The test report has been scanned into EPIC and is located under the Molecular Pathology section of the Results Review tab.  A portion of the result report is included below for reference.     We discussed with Lindsay Guzman that because current genetic testing is not perfect, it is possible there may be a gene mutation in one of these genes that current testing cannot detect, but that chance is small.  We also discussed, that there could be another gene that has not yet been discovered, or that we have not yet tested, that is responsible for the cancer diagnoses in the family. It is also possible  there is a hereditary cause for the cancer in the family that Lindsay Guzman did not inherit and therefore was not identified in her testing.  Therefore, it is important to remain in touch with cancer genetics in the future so that we can continue to offer Lindsay Guzman the most up to date genetic testing.   ADDITIONAL GENETIC TESTING: We  discussed with Lindsay Guzman that her genetic testing was fairly extensive.  If there are genes identified to increase cancer risk that can be analyzed in the future, we would be happy to discuss and coordinate this testing at that time.    CANCER SCREENING RECOMMENDATIONS: Lindsay Guzman test result is considered negative (normal).  This means that we have not identified a hereditary cause for her  personal and family history of cancer at this time. Most cancers happen by chance and this negative test suggests that her cancer may fall into this category.    While reassuring, this does not definitively rule out a hereditary predisposition to cancer. It is still possible that there could be genetic mutations that are undetectable by current technology. There could be genetic mutations in genes that have not been tested or identified to increase cancer risk.  Therefore, it is recommended she continue to follow the cancer management and screening guidelines provided by her oncology and primary healthcare provider.   An individual's cancer risk and medical management are not determined by genetic test results alone. Overall cancer risk assessment incorporates additional factors, including personal medical history, family history, and any available genetic information that may result in a personalized plan for cancer prevention and surveillance.  RECOMMENDATIONS FOR FAMILY MEMBERS:  Relatives in this family might be at some increased risk of developing cancer, over the general population risk, simply due to the family history of cancer.  We recommended female relatives in this family have a yearly mammogram beginning at age 33, or 12 years younger than the earliest onset of cancer, an annual clinical breast exam, and perform monthly breast self-exams. Female relatives in this family should also have a gynecological exam as recommended by their primary provider.  All family members should be referred for colonoscopy  starting at age 31.    It is also possible there is a hereditary cause for the cancer in Lindsay Guzman's family that she did not inherit and therefore was not identified in her.  Based on Lindsay Guzman's family history, we recommended maternal relatives have genetic counseling and testing. Lindsay Guzman will let us know if we can be of any assistance in coordinating genetic counseling and/or testing for these family members.  FOLLOW-UP: Lastly, we discussed with Lindsay Guzman that cancer genetics is a rapidly advancing field and it is possible that new genetic tests will be appropriate for her and/or her family members in the future. We encouraged her to remain in contact with cancer genetics on an annual basis so we can update her personal and family histories and let her know of advances in cancer genetics that may benefit this family.   Our contact number was provided. Lindsay Guzman questions were answered to her satisfaction, and she knows she is welcome to call us at anytime with additional questions or concerns.   Faith Rogue, MS, [facility] Genetic Counselor [email or web address] Phone: [phone]

## 2020-09-22 ENCOUNTER — Ambulatory Visit: Payer: PRIVATE HEALTH INSURANCE

## 2020-09-27 ENCOUNTER — Ambulatory Visit: Admission: RE | Admit: 2020-09-27 | Payer: PRIVATE HEALTH INSURANCE | Source: Ambulatory Visit

## 2020-09-27 DIAGNOSIS — Z510 Encounter for antineoplastic radiation therapy: Secondary | ICD-10-CM | POA: Diagnosis not present

## 2020-09-28 ENCOUNTER — Ambulatory Visit
Admission: RE | Admit: 2020-09-28 | Discharge: 2020-09-28 | Disposition: A | Discharge status: Home / Self Care | Payer: PRIVATE HEALTH INSURANCE | Source: Ambulatory Visit | Attending: Radiation Oncology | Admitting: Radiation Oncology

## 2020-09-28 DIAGNOSIS — Z510 Encounter for antineoplastic radiation therapy: Secondary | ICD-10-CM | POA: Diagnosis not present

## 2020-09-29 ENCOUNTER — Ambulatory Visit
Admission: RE | Admit: 2020-09-29 | Discharge: 2020-09-29 | Disposition: A | Discharge status: Home / Self Care | Payer: PRIVATE HEALTH INSURANCE | Source: Ambulatory Visit | Attending: Radiation Oncology | Admitting: Radiation Oncology

## 2020-09-29 DIAGNOSIS — Z510 Encounter for antineoplastic radiation therapy: Secondary | ICD-10-CM | POA: Diagnosis not present

## 2020-09-30 ENCOUNTER — Ambulatory Visit
Admission: RE | Admit: 2020-09-30 | Discharge: 2020-09-30 | Disposition: A | Discharge status: Home / Self Care | Payer: PRIVATE HEALTH INSURANCE | Source: Ambulatory Visit | Attending: Radiation Oncology | Admitting: Radiation Oncology

## 2020-09-30 DIAGNOSIS — Z510 Encounter for antineoplastic radiation therapy: Secondary | ICD-10-CM | POA: Diagnosis not present

## 2020-10-03 ENCOUNTER — Ambulatory Visit
Admission: RE | Admit: 2020-10-03 | Discharge: 2020-10-03 | Disposition: A | Discharge status: Home / Self Care | Payer: PRIVATE HEALTH INSURANCE | Source: Ambulatory Visit | Attending: Radiation Oncology | Admitting: Radiation Oncology

## 2020-10-03 DIAGNOSIS — Z510 Encounter for antineoplastic radiation therapy: Secondary | ICD-10-CM | POA: Diagnosis not present

## 2020-10-04 ENCOUNTER — Ambulatory Visit
Admission: RE | Admit: 2020-10-04 | Discharge: 2020-10-04 | Disposition: A | Discharge status: Home / Self Care | Payer: PRIVATE HEALTH INSURANCE | Source: Ambulatory Visit | Attending: Radiation Oncology | Admitting: Radiation Oncology

## 2020-10-04 DIAGNOSIS — Z510 Encounter for antineoplastic radiation therapy: Secondary | ICD-10-CM | POA: Diagnosis not present

## 2020-10-05 ENCOUNTER — Ambulatory Visit
Admission: RE | Admit: 2020-10-05 | Discharge: 2020-10-05 | Disposition: A | Discharge status: Home / Self Care | Payer: PRIVATE HEALTH INSURANCE | Source: Ambulatory Visit | Attending: Radiation Oncology | Admitting: Radiation Oncology

## 2020-10-05 DIAGNOSIS — Z510 Encounter for antineoplastic radiation therapy: Secondary | ICD-10-CM | POA: Diagnosis not present

## 2020-10-06 ENCOUNTER — Ambulatory Visit
Admission: RE | Admit: 2020-10-06 | Discharge: 2020-10-06 | Disposition: A | Discharge status: Home / Self Care | Payer: PRIVATE HEALTH INSURANCE | Source: Ambulatory Visit | Attending: Radiation Oncology | Admitting: Radiation Oncology

## 2020-10-06 DIAGNOSIS — Z510 Encounter for antineoplastic radiation therapy: Secondary | ICD-10-CM | POA: Diagnosis not present

## 2020-10-10 ENCOUNTER — Ambulatory Visit
Admission: RE | Admit: 2020-10-10 | Discharge: 2020-10-10 | Disposition: A | Discharge status: Home / Self Care | Payer: PRIVATE HEALTH INSURANCE | Source: Ambulatory Visit | Attending: Radiation Oncology | Admitting: Radiation Oncology

## 2020-10-10 ENCOUNTER — Other Ambulatory Visit: Payer: Self-pay

## 2020-10-10 ENCOUNTER — Inpatient Hospital Stay: Payer: PRIVATE HEALTH INSURANCE | Attending: Oncology

## 2020-10-10 DIAGNOSIS — D0512 Intraductal carcinoma in situ of left breast: Secondary | ICD-10-CM

## 2020-10-10 DIAGNOSIS — Z510 Encounter for antineoplastic radiation therapy: Secondary | ICD-10-CM | POA: Diagnosis not present

## 2020-10-10 LAB — CBC
HCT: 37.7 % (ref 36.0–46.0)
Hemoglobin: 12.3 g/dL (ref 12.0–15.0)
MCH: 27.8 pg (ref 26.0–34.0)
MCHC: 32.6 g/dL (ref 30.0–36.0)
MCV: 85.1 fL (ref 80.0–100.0)
Platelets: 291 10*3/uL (ref 150–400)
RBC: 4.43 MIL/uL (ref 3.87–5.11)
RDW: 13.5 % (ref 11.5–15.5)
WBC: 7.4 10*3/uL (ref 4.0–10.5)
nRBC: 0 % (ref 0.0–0.2)

## 2020-10-11 ENCOUNTER — Ambulatory Visit
Admission: RE | Admit: 2020-10-11 | Discharge: 2020-10-11 | Disposition: A | Discharge status: Home / Self Care | Payer: PRIVATE HEALTH INSURANCE | Source: Ambulatory Visit | Attending: Radiation Oncology | Admitting: Radiation Oncology

## 2020-10-11 DIAGNOSIS — Z510 Encounter for antineoplastic radiation therapy: Secondary | ICD-10-CM | POA: Diagnosis not present

## 2020-10-12 ENCOUNTER — Ambulatory Visit
Admission: RE | Admit: 2020-10-12 | Discharge: 2020-10-12 | Disposition: A | Discharge status: Home / Self Care | Payer: PRIVATE HEALTH INSURANCE | Source: Ambulatory Visit | Attending: Radiation Oncology | Admitting: Radiation Oncology

## 2020-10-12 DIAGNOSIS — Z510 Encounter for antineoplastic radiation therapy: Secondary | ICD-10-CM | POA: Diagnosis not present

## 2020-10-13 ENCOUNTER — Ambulatory Visit
Admission: RE | Admit: 2020-10-13 | Discharge: 2020-10-13 | Disposition: A | Discharge status: Home / Self Care | Payer: PRIVATE HEALTH INSURANCE | Source: Ambulatory Visit | Attending: Radiation Oncology | Admitting: Radiation Oncology

## 2020-10-13 DIAGNOSIS — Z510 Encounter for antineoplastic radiation therapy: Secondary | ICD-10-CM | POA: Diagnosis not present

## 2020-10-17 ENCOUNTER — Telehealth: Payer: Self-pay

## 2020-10-17 ENCOUNTER — Ambulatory Visit
Admission: RE | Admit: 2020-10-17 | Discharge: 2020-10-17 | Disposition: A | Discharge status: Home / Self Care | Payer: PRIVATE HEALTH INSURANCE | Source: Ambulatory Visit | Attending: Radiation Oncology | Admitting: Radiation Oncology

## 2020-10-17 DIAGNOSIS — Z510 Encounter for antineoplastic radiation therapy: Secondary | ICD-10-CM | POA: Diagnosis present

## 2020-10-17 DIAGNOSIS — D0512 Intraductal carcinoma in situ of left breast: Secondary | ICD-10-CM | POA: Diagnosis present

## 2020-10-17 NOTE — Telephone Encounter (Signed)
Done

## 2020-10-17 NOTE — Telephone Encounter (Signed)
Patient's final radiation treatment is on 1/7. Please schedule MD follow up 2 weeks after and notify pt of appt, thanks.

## 2020-10-18 ENCOUNTER — Ambulatory Visit
Admission: RE | Admit: 2020-10-18 | Discharge: 2020-10-18 | Disposition: A | Discharge status: Home / Self Care | Payer: PRIVATE HEALTH INSURANCE | Source: Ambulatory Visit | Attending: Radiation Oncology | Admitting: Radiation Oncology

## 2020-10-18 DIAGNOSIS — Z510 Encounter for antineoplastic radiation therapy: Secondary | ICD-10-CM | POA: Diagnosis not present

## 2020-10-19 ENCOUNTER — Ambulatory Visit
Admission: RE | Admit: 2020-10-19 | Discharge: 2020-10-19 | Disposition: A | Discharge status: Home / Self Care | Payer: PRIVATE HEALTH INSURANCE | Source: Ambulatory Visit | Attending: Radiation Oncology | Admitting: Radiation Oncology

## 2020-10-19 DIAGNOSIS — Z510 Encounter for antineoplastic radiation therapy: Secondary | ICD-10-CM | POA: Diagnosis not present

## 2020-10-20 ENCOUNTER — Ambulatory Visit
Admission: RE | Admit: 2020-10-20 | Discharge: 2020-10-20 | Disposition: A | Discharge status: Home / Self Care | Payer: PRIVATE HEALTH INSURANCE | Source: Ambulatory Visit | Attending: Radiation Oncology | Admitting: Radiation Oncology

## 2020-10-20 DIAGNOSIS — Z510 Encounter for antineoplastic radiation therapy: Secondary | ICD-10-CM | POA: Diagnosis not present

## 2020-10-21 ENCOUNTER — Ambulatory Visit
Admission: RE | Admit: 2020-10-21 | Discharge: 2020-10-21 | Disposition: A | Discharge status: Home / Self Care | Payer: PRIVATE HEALTH INSURANCE | Source: Ambulatory Visit | Attending: Radiation Oncology | Admitting: Radiation Oncology

## 2020-10-21 DIAGNOSIS — Z510 Encounter for antineoplastic radiation therapy: Secondary | ICD-10-CM | POA: Diagnosis not present

## 2020-11-08 ENCOUNTER — Inpatient Hospital Stay: Payer: PRIVATE HEALTH INSURANCE | Attending: Oncology | Admitting: Oncology

## 2020-11-08 ENCOUNTER — Other Ambulatory Visit: Payer: Self-pay

## 2020-11-08 ENCOUNTER — Encounter: Payer: Self-pay | Admitting: Oncology

## 2020-11-08 ENCOUNTER — Inpatient Hospital Stay: Payer: PRIVATE HEALTH INSURANCE

## 2020-11-08 VITALS — BP 126/87 | HR 103 | Temp 98.9°F | Resp 18 | Wt 172.7 lb

## 2020-11-08 DIAGNOSIS — L819 Disorder of pigmentation, unspecified: Secondary | ICD-10-CM | POA: Insufficient documentation

## 2020-11-08 DIAGNOSIS — Z90710 Acquired absence of both cervix and uterus: Secondary | ICD-10-CM | POA: Diagnosis not present

## 2020-11-08 DIAGNOSIS — Z880 Allergy status to penicillin: Secondary | ICD-10-CM | POA: Insufficient documentation

## 2020-11-08 DIAGNOSIS — Z809 Family history of malignant neoplasm, unspecified: Secondary | ICD-10-CM | POA: Insufficient documentation

## 2020-11-08 DIAGNOSIS — Z87891 Personal history of nicotine dependence: Secondary | ICD-10-CM | POA: Diagnosis not present

## 2020-11-08 DIAGNOSIS — Z9049 Acquired absence of other specified parts of digestive tract: Secondary | ICD-10-CM | POA: Diagnosis not present

## 2020-11-08 DIAGNOSIS — D0512 Intraductal carcinoma in situ of left breast: Secondary | ICD-10-CM | POA: Diagnosis present

## 2020-11-08 DIAGNOSIS — Z801 Family history of malignant neoplasm of trachea, bronchus and lung: Secondary | ICD-10-CM | POA: Diagnosis not present

## 2020-11-08 DIAGNOSIS — R600 Localized edema: Secondary | ICD-10-CM | POA: Insufficient documentation

## 2020-11-08 DIAGNOSIS — Z923 Personal history of irradiation: Secondary | ICD-10-CM | POA: Diagnosis not present

## 2020-11-08 DIAGNOSIS — I10 Essential (primary) hypertension: Secondary | ICD-10-CM | POA: Diagnosis not present

## 2020-11-08 DIAGNOSIS — Z803 Family history of malignant neoplasm of breast: Secondary | ICD-10-CM

## 2020-11-08 DIAGNOSIS — N6310 Unspecified lump in the right breast, unspecified quadrant: Secondary | ICD-10-CM | POA: Insufficient documentation

## 2020-11-08 DIAGNOSIS — Z170 Estrogen receptor positive status [ER+]: Secondary | ICD-10-CM | POA: Insufficient documentation

## 2020-11-08 DIAGNOSIS — Z79899 Other long term (current) drug therapy: Secondary | ICD-10-CM | POA: Insufficient documentation

## 2020-11-08 LAB — CBC
HCT: 41.7 % (ref 36.0–46.0)
Hemoglobin: 14 g/dL (ref 12.0–15.0)
MCH: 27.7 pg (ref 26.0–34.0)
MCHC: 33.6 g/dL (ref 30.0–36.0)
MCV: 82.6 fL (ref 80.0–100.0)
Platelets: 260 10*3/uL (ref 150–400)
RBC: 5.05 MIL/uL (ref 3.87–5.11)
RDW: 13.6 % (ref 11.5–15.5)
WBC: 6.8 10*3/uL (ref 4.0–10.5)
nRBC: 0 % (ref 0.0–0.2)

## 2020-11-08 LAB — HEPATIC FUNCTION PANEL
ALT: 18 U/L (ref 0–44)
AST: 23 U/L (ref 15–41)
Albumin: 4.2 g/dL (ref 3.5–5.0)
Alkaline Phosphatase: 63 U/L (ref 38–126)
Bilirubin, Direct: <0.1 mg/dL (ref 0.0–0.2)
Total Bilirubin: 0.5 mg/dL (ref 0.3–1.2)
Total Protein: 8.3 g/dL — ABNORMAL HIGH (ref 6.5–8.1)

## 2020-11-08 NOTE — Progress Notes (Addendum)
Hematology/Oncology progress note [facility] Telephone:([phone] [phone]   Patient Care Team: Sherron Mondayejan-Sie, S Ahmed, MD as PCP - General (Internal Medicine) Scarlett PrestoShaver, Anne F, RN as Oncology Nurse Navigator (Oncology)  REFERRING PROVIDER: Sherron Mondayejan-Sie, S Ahmed, MD CHIEF COMPLAINTS/REASON FOR VISIT:  Follow up for breast cancer DCIS  HISTORY OF PRESENTING ILLNESS:  Lindsay PaciniMichele W Shutt is a  55 y.o.  female with PMH listed below who was referred to me for evaluation of breast cancer  Patient had bilateral diagnostic mammogram and ultrasound on 06/09/2020.  Patient previously had mammogram in 2012 and at that time right breast mass in the left breast mass for recommended.  Patient had left breast biopsy but did not proceed with right breast biopsy. 06/09/2020 bilateral diagnostic mammogram showed Biopsy pathology showed: Indeterminate medial left breast calcifications spanning 2.3 cm.  No other suspicious findings.  Medial right breast mass has been stable since 2012. 06/17/2020 patient underwent stereotactic biopsy of left breast calcifications.  Biopsy showed high-grade DCIS with comedonecrosis.  Associated with calcification.   Family history of breast cancer: Breast cancer in 3 maternal aunts Family history of other cancers: Sister had lung cancer  Menarche: 12 Menopause: history of hysterotomy.  Number of pregnancies : 3 children Age at first live childbirth:16 Used OCP: <10 years use Used estrogen and progesterone therapy: denies History of Radiation to the chest: deneis Previous of breast biopsy:  # 07/27/2020, patient proceed with left breast lumpectomy- Dr.Cintron,  Due to the high-grade DCIS, patient opted to do sentinel lymph node biopsy at the same time of lumpectomy surgery. Pathology showed left breast high-grade DCIS, with comedonecrosis, negative for invasive component.  No lymph node involvement.  Closest DCIS margin was 0.5 mm.  ER  positive 08/12/20 re-excision of inferior margin.pathology showed negative for residual DCIS, or invasive carcinoma.    INTERVAL HISTORY Lindsay Guzman is a 55 y.o. female who has above history reviewed by me today presents for follow up visit for management of left breast high-grade DCIS Problems and complaints are listed below: 10/21/2020 finished adjuvant radiation.  She reports doing well today except some skin irritation at the site of radiation.   Remote history of hysterectomy.   Review of Systems  Constitutional: Negative for appetite change, chills, fatigue and fever.  HENT:   Negative for hearing loss and voice change.   Eyes: Negative for eye problems.  Respiratory: Negative for chest tightness, cough and shortness of breath.   Cardiovascular: Negative for chest pain.  Gastrointestinal: Negative for abdominal distention, abdominal pain and blood in stool.  Endocrine: Negative for hot flashes.  Genitourinary: Negative for difficulty urinating and frequency.   Musculoskeletal: Negative for arthralgias.  Skin: Negative for itching and rash.  Neurological: Negative for extremity weakness.  Hematological: Negative for adenopathy.  Psychiatric/Behavioral: Negative for confusion.     MEDICAL HISTORY:  Past Medical History:  Diagnosis Date  . Arthritis   . Family history of breast cancer   . Family history of lung cancer   . History of kidney stones   . History of stroke 04/18/2020  . Hypercholesteremia   . Hypertension   . Stroke [facility](H[facility])     SURGICAL HISTORY: Past Surgical History:  Procedure Laterality Date  . ABDOMINAL HYSTERECTOMY    . BREAST BIOPSY Left ?   neg  . BREAST BIOPSY Left 06/17/2020   stereo bx, x-clip, path pending  . CHOLECYSTECTOMY    . MASTECTOMY, PARTIAL Left 08/12/2020   Procedure: RE-EXCISION OF INFERIOR  MARGIN OF LEFT BREAST, DRAINAGE OF SEROMA OF LEFT BREAST;  Surgeon: Carolan Shiver, MD;  Location: [facility] ORS;  Service: General;   Laterality: Left;    SOCIAL HISTORY: Social History   Socioeconomic History  . Marital status: Married    Spouse name: Not on file  . Number of children: Not on file  . Years of education: Not on file  . Highest education level: Not on file  Occupational History  . Not on file  Tobacco Use  . Smoking status: Former Smoker    Types: Cigarettes    Quit date: 04/18/2020    Years since quitting: 0.5  . Smokeless tobacco: Never Used  Substance and Sexual Activity  . Alcohol use: Yes    Comment: occasionally   . Drug use: Yes    Types: Marijuana  . Sexual activity: Not on file  Other Topics Concern  . Not on file  Social History Narrative  . Not on file   Social Determinants of Health   Financial Resource Strain: Not on file  Food Insecurity: Not on file  Transportation Needs: Not on file  Physical Activity: Not on file  Stress: Not on file  Social Connections: Not on file  Intimate Partner Violence: Not on file    FAMILY HISTORY: Family History  Problem Relation Age of Onset  . Breast cancer Maternal Aunt   . Breast cancer Maternal Aunt   . Breast cancer Maternal Aunt   . Lung cancer Maternal Uncle   . Cancer Paternal Aunt        unk type  . Lung cancer Maternal Aunt     ALLERGIES:  is allergic to other and penicillins.  MEDICATIONS:  Current Outpatient Medications  Medication Sig Dispense Refill  . acetaminophen (TYLENOL) 500 MG tablet Take 500 mg by mouth every 6 (six) hours as needed.    Marland Kitchen amLODipine (NORVASC) 10 MG tablet Take 10 mg by mouth daily.    Marland Kitchen aspirin EC 81 MG tablet Take 81 mg by mouth daily. Swallow whole.     . clopidogrel (PLAVIX) 75 MG tablet Take 75 mg by mouth daily.    . hydrALAZINE (APRESOLINE) 100 MG tablet Take 100 mg by mouth 2 (two) times daily as needed.    . Multiple Vitamins-Minerals (CENTRUM SILVER ADULT 50+ PO) Take 1 tablet by mouth daily.    Marland Kitchen olmesartan-hydrochlorothiazide (BENICAR HCT) 40-25 MG tablet Take 1 tablet by mouth  every morning.    . rosuvastatin (CRESTOR) 20 MG tablet Take 20 mg by mouth daily.     No current facility-administered medications for this visit.     PHYSICAL EXAMINATION: ECOG PERFORMANCE STATUS: 0 - Asymptomatic Vitals:   11/08/20 1435  BP: 126/87  Pulse: (!) 103  Resp: 18  Temp: 98.9 F (37.2 C)   Filed Weights   11/08/20 1435  Weight: 172 lb 11.2 oz (78.3 kg)    Physical Exam Constitutional:      General: She is not in acute distress.    Appearance: She is not diaphoretic.  HENT:     Head: Normocephalic and atraumatic.     Nose: Nose normal.     Mouth/Throat:     Pharynx: No oropharyngeal exudate.  Eyes:     General: No scleral icterus.    Pupils: Pupils are equal, round, and reactive to light.  Cardiovascular:     Rate and Rhythm: Normal rate and regular rhythm.     Heart sounds: No murmur heard.   Pulmonary:  Effort: Pulmonary effort is normal. No respiratory distress.     Breath sounds: No rales.  Chest:     Chest wall: No tenderness.  Abdominal:     General: There is no distension.     Palpations: Abdomen is soft.     Tenderness: There is no abdominal tenderness.  Musculoskeletal:        General: Normal range of motion.     Cervical back: Normal range of motion and neck supple.  Skin:    General: Skin is warm and dry.     Findings: No erythema.  Neurological:     Mental Status: She is alert and oriented to person, place, and time.     Cranial Nerves: No cranial nerve deficit.     Motor: No abnormal muscle tone.     Coordination: Coordination normal.  Psychiatric:        Mood and Affect: Affect normal.      Left breast status post lumpectomy and radiation. Hyperpigmentation area and mild skin edema.   LABORATORY DATA:  I have reviewed the data as listed Lab Results  Component Value Date   WBC 6.8 11/08/2020   HGB 14.0 11/08/2020   HCT 41.7 11/08/2020   MCV 82.6 11/08/2020   PLT 260 11/08/2020   Recent Labs    04/19/20 0857  04/20/20 0438 04/21/20 0827 06/28/20 1544 08/10/20 1033 11/08/20 1512  NA 137 139  --  138 135  --   K 3.1* 3.8 3.4* 3.6 3.3*  --   CL 100 104  --  97* 98  --   CO2 24 27  --  30 26  --   GLUCOSE 122* 97  --  86 92  --   BUN 16 13  --  16 14  --   CREATININE 0.91 0.83  --  0.75 0.90  --   CALCIUM 8.8* 8.8*  --  9.9 9.4  --   GFRNONAA >60 >60  --  >60 >60  --   GFRAA >60 >60  --  >60  --   --   PROT  --   --   --  8.3* 8.6* 8.3*  ALBUMIN  --  3.6  --  4.5 4.4 4.2  AST  --   --   --  26 22 23   ALT  --   --   --  20 17 18   ALKPHOS  --   --   --  57 71 63  BILITOT  --   --   --  0.5 0.7 0.5  BILIDIR  --   --   --   --   --  <0.1  IBILI  --   --   --   --   --  NOT CALCULATED   Iron/TIBC/Ferritin/ %Sat No results found for: IRON, TIBC, FERRITIN, IRONPCTSAT      ASSESSMENT & PLAN:  1. Ductal carcinoma in situ (DCIS) of left breast   2. Family history of breast cancer    Left breast high-grade DCIS, ER positive, status post lumpectomy and optional sentinel lymph node biopsy and re-excision of margin.  pTis pN0.  Check FSH and estradil.  Discussed about rational and side effects of adjuvant estrogen treatment, aromatase inhibitor vs Tamoxifen, based on her menopause state. Patient agrees with the plan.   Family history of breast cancer, genetic testing showed no pathogenic mutation.    Orders Placed This Encounter  Procedures  . Follicle Stimulating Hormone  Standing Status:   Future    Number of Occurrences:   1    Standing Expiration Date:   11/08/2021  . Estradiol    Standing Status:   Future    Number of Occurrences:   1    Standing Expiration Date:   11/08/2021  . Hepatic function panel    Standing Status:   Future    Number of Occurrences:   1    Standing Expiration Date:   11/08/2021    All questions were answered. The patient knows to call the clinic with any problems questions or concerns.  Return of visit 6 weeks.   [name], MD, PhD Hematology  Oncology [facility] Pager- [phone] 11/08/2020   Addendum Estradiol 9, FSH 106 she is postmenopausal.  Recommend her to start Arimidex 1mg  daily.   

## 2020-11-08 NOTE — Progress Notes (Signed)
Pt here for follow up. No new concerns voiced.   

## 2020-11-09 LAB — ESTRADIOL: Estradiol: 9 pg/mL

## 2020-11-09 LAB — FOLLICLE STIMULATING HORMONE: FSH: 106 m[IU]/mL

## 2020-11-09 MED ORDER — ANASTROZOLE 1 MG PO TABS: 1.0000 mg | ORAL_TABLET | Freq: Every day | ORAL | 2 refills | Status: DC

## 2020-11-09 NOTE — Addendum Note (Signed)
Addended by: [name] on: 11/09/2020 08:44 PM   Modules accepted: Orders

## 2020-11-10 ENCOUNTER — Telehealth: Payer: Self-pay

## 2020-11-10 NOTE — Telephone Encounter (Signed)
-----   Message from [name], MD sent at 11/09/2020  8:38 PM EST ----- Please let her know that she is postmenopausal and I recommend her to start Arimidex. Rx sent.  Keep current follow up plan.Lindsay Guzman

## 2020-11-10 NOTE — Telephone Encounter (Signed)
Patient notified and voiced understanding.

## 2020-11-24 ENCOUNTER — Encounter: Payer: Self-pay | Admitting: Radiation Oncology

## 2020-11-24 ENCOUNTER — Ambulatory Visit
Admission: RE | Admit: 2020-11-24 | Discharge: 2020-11-24 | Disposition: A | Discharge status: Home / Self Care | Payer: PRIVATE HEALTH INSURANCE | Source: Ambulatory Visit | Attending: Radiation Oncology | Admitting: Radiation Oncology

## 2020-11-24 VITALS — BP 120/86 | HR 81 | Temp 97.8°F | Wt 182.0 lb

## 2020-11-24 DIAGNOSIS — D0512 Intraductal carcinoma in situ of left breast: Secondary | ICD-10-CM

## 2020-11-24 NOTE — Progress Notes (Signed)
Radiation Oncology Follow up Note  Name: Lindsay Guzman   Date:   11/24/2020 MRN:  [id number] DOB: Mar 14, 1966    This 55 y.o. female presents to the clinic today for 1 month follow-up status post whole breast radiation to her left breast for ductal carcinoma in situ ER positive.  REFERRING PROVIDER: Sherron Monday, MD  HPI: Patient is a 55 year old female now at 1 month having completed whole breast radiation to her left breast for ER positive ductal carcinoma in situ seen today in routine follow-up she is doing well.  She specifically denies breast tenderness cough or bone pain.  She still has some significant hyperpigmentation of the skin..  Patient has been started on Arimidex is tolerating it well without side effect.  COMPLICATIONS OF TREATMENT: none  FOLLOW UP COMPLIANCE: keeps appointments   PHYSICAL EXAM:  BP 120/86   Pulse 81   Temp 97.8 F (36.6 C) (Tympanic)   Wt 182 lb (82.6 kg)   BMI 30.76 kg/m  Left breast is somewhat retracted although cosmetic result is good to excellent.  Breast is still hyperpigmented.  No dominant masses noted in either breast.  No axillary or supraclavicular adenopathy is noted.  Well-developed well-nourished patient in NAD. HEENT reveals PERLA, EOMI, discs not visualized.  Oral cavity is clear. No oral mucosal lesions are identified. Neck is clear without evidence of cervical or supraclavicular adenopathy. Lungs are clear to A&P. Cardiac examination is essentially unremarkable with regular rate and rhythm without murmur rub or thrill. Abdomen is benign with no organomegaly or masses noted. Motor sensory and DTR levels are equal and symmetric in the upper and lower extremities. Cranial nerves II through XII are grossly intact. Proprioception is intact. No peripheral adenopathy or edema is identified. No motor or sensory levels are noted. Crude visual fields are within normal range.  RADIOLOGY RESULTS: No current films to review  PLAN: Present time  patient is doing well recovering nicely 1 month out from whole breast radiation and pleased with her overall progress.  She continues on Arimidex without side effect.  Patient knows to call with any concerns I will see her back in 4 to 5 months for follow-up.  I would like to take this opportunity to thank you for allowing me to participate in the care of your patient.Carmina Miller, MD

## 2020-12-19 ENCOUNTER — Other Ambulatory Visit: Payer: Self-pay

## 2020-12-19 DIAGNOSIS — D0512 Intraductal carcinoma in situ of left breast: Secondary | ICD-10-CM

## 2020-12-20 ENCOUNTER — Inpatient Hospital Stay (HOSPITAL_BASED_OUTPATIENT_CLINIC_OR_DEPARTMENT_OTHER): Payer: PRIVATE HEALTH INSURANCE | Admitting: Oncology

## 2020-12-20 ENCOUNTER — Encounter: Payer: Self-pay | Admitting: Oncology

## 2020-12-20 ENCOUNTER — Other Ambulatory Visit: Payer: Self-pay

## 2020-12-20 ENCOUNTER — Inpatient Hospital Stay: Payer: PRIVATE HEALTH INSURANCE | Attending: Oncology

## 2020-12-20 VITALS — BP 137/93 | HR 73 | Temp 98.0°F | Resp 16 | Wt 183.7 lb

## 2020-12-20 DIAGNOSIS — Z79811 Long term (current) use of aromatase inhibitors: Secondary | ICD-10-CM | POA: Diagnosis not present

## 2020-12-20 DIAGNOSIS — Z8673 Personal history of transient ischemic attack (TIA), and cerebral infarction without residual deficits: Secondary | ICD-10-CM | POA: Insufficient documentation

## 2020-12-20 DIAGNOSIS — D0512 Intraductal carcinoma in situ of left breast: Secondary | ICD-10-CM | POA: Diagnosis present

## 2020-12-20 DIAGNOSIS — Z90710 Acquired absence of both cervix and uterus: Secondary | ICD-10-CM | POA: Diagnosis not present

## 2020-12-20 DIAGNOSIS — Z880 Allergy status to penicillin: Secondary | ICD-10-CM | POA: Insufficient documentation

## 2020-12-20 DIAGNOSIS — Z87442 Personal history of urinary calculi: Secondary | ICD-10-CM | POA: Insufficient documentation

## 2020-12-20 DIAGNOSIS — Z87891 Personal history of nicotine dependence: Secondary | ICD-10-CM | POA: Insufficient documentation

## 2020-12-20 DIAGNOSIS — Z170 Estrogen receptor positive status [ER+]: Secondary | ICD-10-CM | POA: Insufficient documentation

## 2020-12-20 DIAGNOSIS — Z923 Personal history of irradiation: Secondary | ICD-10-CM | POA: Insufficient documentation

## 2020-12-20 DIAGNOSIS — Z803 Family history of malignant neoplasm of breast: Secondary | ICD-10-CM | POA: Insufficient documentation

## 2020-12-20 DIAGNOSIS — Z801 Family history of malignant neoplasm of trachea, bronchus and lung: Secondary | ICD-10-CM | POA: Diagnosis not present

## 2020-12-20 DIAGNOSIS — Z79899 Other long term (current) drug therapy: Secondary | ICD-10-CM | POA: Insufficient documentation

## 2020-12-20 DIAGNOSIS — Z9049 Acquired absence of other specified parts of digestive tract: Secondary | ICD-10-CM | POA: Diagnosis not present

## 2020-12-20 DIAGNOSIS — I10 Essential (primary) hypertension: Secondary | ICD-10-CM | POA: Diagnosis not present

## 2020-12-20 LAB — CBC WITH DIFFERENTIAL/PLATELET
Abs Immature Granulocytes: 0.01 10*3/uL (ref 0.00–0.07)
Basophils Absolute: 0 10*3/uL (ref 0.0–0.1)
Basophils Relative: 0 %
Eosinophils Absolute: 0.1 10*3/uL (ref 0.0–0.5)
Eosinophils Relative: 2 %
HCT: 41.9 % (ref 36.0–46.0)
Hemoglobin: 13.8 g/dL (ref 12.0–15.0)
Immature Granulocytes: 0 %
Lymphocytes Relative: 34 %
Lymphs Abs: 2 10*3/uL (ref 0.7–4.0)
MCH: 27 pg (ref 26.0–34.0)
MCHC: 32.9 g/dL (ref 30.0–36.0)
MCV: 82 fL (ref 80.0–100.0)
Monocytes Absolute: 0.4 10*3/uL (ref 0.1–1.0)
Monocytes Relative: 8 %
Neutro Abs: 3.3 10*3/uL (ref 1.7–7.7)
Neutrophils Relative %: 56 %
Platelets: 284 10*3/uL (ref 150–400)
RBC: 5.11 MIL/uL (ref 3.87–5.11)
RDW: 14.2 % (ref 11.5–15.5)
WBC: 5.8 10*3/uL (ref 4.0–10.5)
nRBC: 0 % (ref 0.0–0.2)

## 2020-12-20 LAB — COMPREHENSIVE METABOLIC PANEL
ALT: 18 U/L (ref 0–44)
AST: 25 U/L (ref 15–41)
Albumin: 4.3 g/dL (ref 3.5–5.0)
Alkaline Phosphatase: 58 U/L (ref 38–126)
Anion gap: 12 (ref 5–15)
BUN: 11 mg/dL (ref 6–20)
CO2: 25 mmol/L (ref 22–32)
Calcium: 9.4 mg/dL (ref 8.9–10.3)
Chloride: 103 mmol/L (ref 98–111)
Creatinine, Ser: 0.72 mg/dL (ref 0.44–1.00)
GFR, Estimated: >60 mL/min (ref >60)
Glucose, Bld: 88 mg/dL (ref 70–99)
Potassium: 3.4 mmol/L — ABNORMAL LOW (ref 3.5–5.1)
Sodium: 140 mmol/L (ref 135–145)
Total Bilirubin: 0.6 mg/dL (ref 0.3–1.2)
Total Protein: 8.4 g/dL — ABNORMAL HIGH (ref 6.5–8.1)

## 2020-12-20 NOTE — Progress Notes (Signed)
Survivorship Care Plan visit completed.  Treatment summary reviewed and given to patient.  ASCO answers booklet reviewed and given to patient.  CARE program and Cancer Transitions discussed with patient along with other resources cancer center offers to patients and caregivers.  Patient verbalized understanding.    

## 2020-12-20 NOTE — Progress Notes (Signed)
Patient has noticed easy bruising with unknown cause since starting Anastrozole.

## 2020-12-20 NOTE — Progress Notes (Signed)
Hematology/Oncology progress note [facility] Telephone:([phone] Fax:([phone]   Patient Care Team: Sherron Monday, MD as PCP - General (Internal Medicine) [name], MD as Consulting Physician (Oncology) Carolan Shiver, MD as Consulting Physician (General Surgery) Carmina Miller, MD as Referring Physician (Radiation Oncology) Scarlett Presto, RN as Oncology Nurse Navigator (Oncology)  REFERRING PROVIDER: Sherron Monday, MD CHIEF COMPLAINTS/REASON FOR VISIT:  Follow up for breast cancer DCIS  HISTORY OF PRESENTING ILLNESS:  Lindsay Guzman is a  55 y.o.  female with PMH listed below who was referred to me for evaluation of breast cancer  Patient had bilateral diagnostic mammogram and ultrasound on 06/09/2020.  Patient previously had mammogram in 2012 and at that time right breast mass in the left breast mass for recommended.  Patient had left breast biopsy but did not proceed with right breast biopsy. 06/09/2020 bilateral diagnostic mammogram showed Biopsy pathology showed: Indeterminate medial left breast calcifications spanning 2.3 cm.  No other suspicious findings.  Medial right breast mass has been stable since 2012. 06/17/2020 patient underwent stereotactic biopsy of left breast calcifications.  Biopsy showed high-grade DCIS with comedonecrosis.  Associated with calcification.   Family history of breast cancer: Breast cancer in 3 maternal aunts Family history of other cancers: Sister had lung cancer  Menarche: 12 Menopause: history of hysterotomy.  Number of pregnancies : 3 children Age at first live childbirth:16 Used OCP: <10 years use Used estrogen and progesterone therapy: denies History of Radiation to the chest: deneis   # 07/27/2020, patient proceed with left breast lumpectomy- Dr.Cintron,  Due to the high-grade DCIS, patient opted to do sentinel lymph node biopsy at the same time of lumpectomy surgery. Pathology showed left  breast high-grade DCIS, with comedonecrosis, negative for invasive component.  No lymph node involvement.  Closest DCIS margin was 0.5 mm.  ER positive 08/12/20 re-excision of inferior margin.pathology showed negative for residual DCIS, or invasive carcinoma.   10/21/2020 finished adjuvant radiation.  January 2022, started on adjuvant Arimidex.  INTERVAL HISTORY LEANDREA Guzman is a 55 y.o. female who has above history reviewed by me today presents for follow up visit for management of left breast high-grade DCIS Problems and complaints are listed below: Patient has been on Arimidex since January 2022.  Overall tolerates well. Remote history of hysterectomy.   Review of Systems  Constitutional: Negative for appetite change, chills, fatigue and fever.  HENT:   Negative for hearing loss and voice change.   Eyes: Negative for eye problems.  Respiratory: Negative for chest tightness, cough and shortness of breath.   Cardiovascular: Negative for chest pain.  Gastrointestinal: Negative for abdominal distention, abdominal pain and blood in stool.  Endocrine: Negative for hot flashes.  Genitourinary: Negative for difficulty urinating and frequency.   Musculoskeletal: Negative for arthralgias.  Skin: Negative for itching and rash.  Neurological: Negative for extremity weakness.  Hematological: Negative for adenopathy.  Psychiatric/Behavioral: Negative for confusion.     MEDICAL HISTORY:  Past Medical History:  Diagnosis Date  . Arthritis   . Family history of breast cancer   . Family history of lung cancer   . History of kidney stones   . History of stroke 04/18/2020  . Hypercholesteremia   . Hypertension   . Stroke [facility])     SURGICAL HISTORY: Past Surgical History:  Procedure Laterality Date  . ABDOMINAL HYSTERECTOMY    . BREAST BIOPSY Left ?   neg  . BREAST BIOPSY Left 06/17/2020  stereo bx, x-clip, path pending  . CHOLECYSTECTOMY    . MASTECTOMY, PARTIAL Left 08/12/2020    Procedure: RE-EXCISION OF INFERIOR MARGIN OF LEFT BREAST, DRAINAGE OF SEROMA OF LEFT BREAST;  Surgeon: Carolan Shiverintron-Diaz, Edgardo, MD;  Location: [facility] ORS;  Service: General;  Laterality: Left;    SOCIAL HISTORY: Social History   Socioeconomic History  . Marital status: Married    Spouse name: Not on file  . Number of children: Not on file  . Years of education: Not on file  . Highest education level: Not on file  Occupational History  . Not on file  Tobacco Use  . Smoking status: Former Smoker    Types: Cigarettes    Quit date: 04/18/2020    Years since quitting: 0.6  . Smokeless tobacco: Never Used  Substance and Sexual Activity  . Alcohol use: Yes    Comment: occasionally   . Drug use: Yes    Types: Marijuana  . Sexual activity: Not on file  Other Topics Concern  . Not on file  Social History Narrative  . Not on file   Social Determinants of Health   Financial Resource Strain: Not on file  Food Insecurity: Not on file  Transportation Needs: Not on file  Physical Activity: Not on file  Stress: Not on file  Social Connections: Not on file  Intimate Partner Violence: Not on file    FAMILY HISTORY: Family History  Problem Relation Age of Onset  . Breast cancer Maternal Aunt   . Breast cancer Maternal Aunt   . Breast cancer Maternal Aunt   . Lung cancer Maternal Uncle   . Cancer Paternal Aunt        unk type  . Lung cancer Maternal Aunt     ALLERGIES:  is allergic to other and penicillins.  MEDICATIONS:  Current Outpatient Medications  Medication Sig Dispense Refill  . acetaminophen (TYLENOL) 500 MG tablet Take 500 mg by mouth every 6 (six) hours as needed.    Marland Kitchen. amLODipine (NORVASC) 10 MG tablet Take 10 mg by mouth daily.    Marland Kitchen. anastrozole (ARIMIDEX) 1 MG tablet Take 1 tablet (1 mg total) by mouth daily. 30 tablet 2  . aspirin EC 81 MG tablet Take 81 mg by mouth daily. Swallow whole.     . Calcium-Magnesium-Vitamin D (CALCIUM 1200+D3 PO) Take by mouth. Calcium 500  + vit D 800 2 tabs IU qd    . cholecalciferol (VITAMIN D3) 25 MCG (1000 UNIT) tablet Take 1,000 Units by mouth daily.    . clopidogrel (PLAVIX) 75 MG tablet Take 75 mg by mouth daily.    . hydrALAZINE (APRESOLINE) 100 MG tablet Take 100 mg by mouth 2 (two) times daily as needed.    . Multiple Vitamins-Minerals (CENTRUM SILVER ADULT 50+ PO) Take 1 tablet by mouth daily.    Marland Kitchen. olmesartan-hydrochlorothiazide (BENICAR HCT) 40-25 MG tablet Take 1 tablet by mouth every morning.    . rosuvastatin (CRESTOR) 20 MG tablet Take 20 mg by mouth daily.     No current facility-administered medications for this visit.     PHYSICAL EXAMINATION: ECOG PERFORMANCE STATUS: 0 - Asymptomatic Vitals:   12/20/20 1101  BP: (!) 137/93  Pulse: 73  Resp: 16  Temp: 98 F (36.7 C)   Filed Weights   12/20/20 1101  Weight: 183 lb 11.2 oz (83.3 kg)    Physical Exam Constitutional:      General: She is not in acute distress.    Appearance: She  is not diaphoretic.  HENT:     Head: Normocephalic and atraumatic.     Nose: Nose normal.     Mouth/Throat:     Pharynx: No oropharyngeal exudate.  Eyes:     General: No scleral icterus.    Pupils: Pupils are equal, round, and reactive to light.  Cardiovascular:     Rate and Rhythm: Normal rate and regular rhythm.     Heart sounds: No murmur heard.   Pulmonary:     Effort: Pulmonary effort is normal. No respiratory distress.     Breath sounds: No rales.  Chest:     Chest wall: No tenderness.  Abdominal:     General: There is no distension.     Palpations: Abdomen is soft.     Tenderness: There is no abdominal tenderness.  Musculoskeletal:        General: Normal range of motion.     Cervical back: Normal range of motion and neck supple.  Skin:    General: Skin is warm and dry.     Findings: No erythema.  Neurological:     Mental Status: She is alert and oriented to person, place, and time.     Cranial Nerves: No cranial nerve deficit.     Motor: No  abnormal muscle tone.     Coordination: Coordination normal.  Psychiatric:        Mood and Affect: Affect normal.    Left breast status post lumpectomy and radiation.   LABORATORY DATA:  I have reviewed the data as listed Lab Results  Component Value Date   WBC 5.8 12/20/2020   HGB 13.8 12/20/2020   HCT 41.9 12/20/2020   MCV 82.0 12/20/2020   PLT 284 12/20/2020   Recent Labs    04/19/20 0857 04/20/20 0438 04/21/20 0827 06/28/20 1544 08/10/20 1033 11/08/20 1512 12/20/20 0952  NA 137 139  --  138 135  --  140  K 3.1* 3.8   < > 3.6 3.3*  --  3.4*  CL 100 104  --  97* 98  --  103  CO2 24 27  --  30 26  --  25  GLUCOSE 122* 97  --  86 92  --  88  BUN 16 13  --  16 14  --  11  CREATININE 0.91 0.83  --  0.75 0.90  --  0.72  CALCIUM 8.8* 8.8*  --  9.9 9.4  --  9.4  GFRNONAA >60 >60  --  >60 >60  --  >60  GFRAA >60 >60  --  >60  --   --   --   PROT  --   --   --  8.3* 8.6* 8.3* 8.4*  ALBUMIN  --  3.6  --  4.5 4.4 4.2 4.3  AST  --   --   --  26 22 23 25   ALT  --   --   --  20 17 18 18   ALKPHOS  --   --   --  57 71 63 58  BILITOT  --   --   --  0.5 0.7 0.5 0.6  BILIDIR  --   --   --   --   --  <0.1  --   IBILI  --   --   --   --   --  NOT CALCULATED  --    < > = values in this interval not displayed.   Iron/TIBC/Ferritin/ %Sat No  results found for: IRON, TIBC, FERRITIN, IRONPCTSAT      ASSESSMENT & PLAN:  1. Ductal carcinoma in situ (DCIS) of left breast   2. Aromatase inhibitor use    Left breast high-grade DCIS, ER positive, status post lumpectomy and optional sentinel lymph node biopsy and re-excision of margin.  pTis pN0.  Patient is postmenopausal and has been started on antiestrogen treatment with Arimidex And overall she tolerates well. Continue current regimen Discussed about annual breast mammogram which will be obtained in October 2022. I will obtain baseline bone density. Recommend patient to continue calcium and vitamin D supplementation.  Family  history of breast cancer, genetic testing showed no pathogenic mutation.    Orders Placed This Encounter  Procedures  . DG Bone Density    Standing Status:   Future    Standing Expiration Date:   12/20/2021    Order Specific Question:   Reason for Exam (SYMPTOM  OR DIAGNOSIS REQUIRED)    Answer:   DCIS, on aromatase inhibitor    Order Specific Question:   Is the patient pregnant?    Answer:   No    Order Specific Question:   Preferred imaging location?    Answer:   [facility]  . CBC with Differential/Platelet    Standing Status:   Future    Standing Expiration Date:   12/20/2021  . Comprehensive metabolic panel    Standing Status:   Future    Standing Expiration Date:   12/20/2021    All questions were answered. The patient knows to call the clinic with any problems questions or concerns.  Return of visit 4 months  [name], MD, PhD Hematology Oncology [facility] Pager- [phone] 12/20/2020

## 2021-01-09 ENCOUNTER — Other Ambulatory Visit: Payer: PRIVATE HEALTH INSURANCE

## 2021-02-07 ENCOUNTER — Other Ambulatory Visit: Payer: Self-pay | Admitting: Oncology

## 2021-03-27 ENCOUNTER — Ambulatory Visit
Admission: RE | Admit: 2021-03-27 | Discharge: 2021-03-27 | Disposition: A | Discharge status: Home / Self Care | Payer: PRIVATE HEALTH INSURANCE | Source: Ambulatory Visit | Attending: Radiation Oncology | Admitting: Radiation Oncology

## 2021-03-27 VITALS — BP 130/90 | HR 82 | Temp 97.5°F | Resp 20 | Wt 183.3 lb

## 2021-03-27 DIAGNOSIS — D512 Transcobalamin II deficiency: Secondary | ICD-10-CM | POA: Insufficient documentation

## 2021-03-27 DIAGNOSIS — Z923 Personal history of irradiation: Secondary | ICD-10-CM | POA: Insufficient documentation

## 2021-03-27 DIAGNOSIS — Z79811 Long term (current) use of aromatase inhibitors: Secondary | ICD-10-CM | POA: Diagnosis not present

## 2021-03-27 DIAGNOSIS — D0512 Intraductal carcinoma in situ of left breast: Secondary | ICD-10-CM | POA: Diagnosis present

## 2021-03-27 DIAGNOSIS — Z170 Estrogen receptor positive status [ER+]: Secondary | ICD-10-CM | POA: Diagnosis not present

## 2021-03-27 NOTE — Progress Notes (Signed)
Radiation Oncology Follow up Note  Name: Lindsay Guzman   Date:   03/27/2021 MRN:  [id number] DOB: [birth year]    This 55 y.o. 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  Seen today in routine follow-up she is doing well she specifically denies breast tenderness cough or bone pain.  She states sometimes when the weather is changing she does have some throbbing pain in her breast.  She is currently on.  Arimidex tolerating it well.  She is not yet had imaging.  COMPLICATIONS OF TREATMENT: none  FOLLOW UP COMPLIANCE: keeps appointments   PHYSICAL EXAM:  BP 130/90   Pulse 82   Temp (!) 97.5 F (36.4 C)   Resp 20   Wt 183 lb 4.8 oz (83.1 kg)   SpO2 100%   BMI 30.98 kg/m  Left breast has some hypopigmentation of the skin around her scar.  Otherwise cosmetic result is good.  No dominant masses noted in either breast no axillary or supraclavicular adenopathy is appreciated.  Well-developed well-nourished patient in NAD. HEENT reveals PERLA, EOMI, discs not visualized.  Oral cavity is clear. No oral mucosal lesions are identified. Neck is clear without evidence of cervical or supraclavicular adenopathy. Lungs are clear to A&P. Cardiac examination is essentially unremarkable with regular rate and rhythm without murmur rub or thrill. Abdomen is benign with no organomegaly or masses noted. Motor sensory and DTR levels are equal and symmetric in the upper and lower extremities. Cranial nerves II through XII are grossly intact. Proprioception is intact. No peripheral adenopathy or edema is identified. No motor or sensory levels are noted. Crude visual fields are within normal  range.  RADIOLOGY RESULTS: No current films to review  PLAN: Present time patient is doing well with no evidence of disease now out close to 6 months from whole breast radiation and pleased with her overall progress.  Of asked to see her back in 6 months for follow-up and then will start once year follow-up visits.  She continues on Arimidex.  I am sure she will have imaging ordered prior to her next visit.  Patient knows to call with any concerns.  I would like to take this opportunity to thank you for allowing me to participate in the care of your patient.Carmina Miller, MD

## 2021-04-20 ENCOUNTER — Inpatient Hospital Stay: Payer: PRIVATE HEALTH INSURANCE

## 2021-04-20 ENCOUNTER — Inpatient Hospital Stay: Payer: PRIVATE HEALTH INSURANCE | Admitting: Oncology

## 2021-04-25 ENCOUNTER — Other Ambulatory Visit: Payer: Self-pay | Admitting: General Surgery

## 2021-04-25 DIAGNOSIS — Z853 Personal history of malignant neoplasm of breast: Secondary | ICD-10-CM

## 2021-05-08 ENCOUNTER — Inpatient Hospital Stay: Payer: PRIVATE HEALTH INSURANCE | Attending: Oncology

## 2021-05-08 ENCOUNTER — Encounter: Payer: Self-pay | Admitting: Oncology

## 2021-05-08 ENCOUNTER — Inpatient Hospital Stay (HOSPITAL_BASED_OUTPATIENT_CLINIC_OR_DEPARTMENT_OTHER): Payer: PRIVATE HEALTH INSURANCE | Admitting: Oncology

## 2021-05-08 VITALS — BP 133/94 | HR 77 | Temp 98.3°F | Resp 18 | Wt 183.5 lb

## 2021-05-08 DIAGNOSIS — Z803 Family history of malignant neoplasm of breast: Secondary | ICD-10-CM | POA: Insufficient documentation

## 2021-05-08 DIAGNOSIS — M2550 Pain in unspecified joint: Secondary | ICD-10-CM | POA: Diagnosis not present

## 2021-05-08 DIAGNOSIS — Z87891 Personal history of nicotine dependence: Secondary | ICD-10-CM | POA: Diagnosis not present

## 2021-05-08 DIAGNOSIS — D0512 Intraductal carcinoma in situ of left breast: Secondary | ICD-10-CM

## 2021-05-08 DIAGNOSIS — Z79899 Other long term (current) drug therapy: Secondary | ICD-10-CM | POA: Diagnosis not present

## 2021-05-08 DIAGNOSIS — Z90710 Acquired absence of both cervix and uterus: Secondary | ICD-10-CM | POA: Diagnosis not present

## 2021-05-08 DIAGNOSIS — Z87442 Personal history of urinary calculi: Secondary | ICD-10-CM | POA: Diagnosis not present

## 2021-05-08 DIAGNOSIS — Z79811 Long term (current) use of aromatase inhibitors: Secondary | ICD-10-CM | POA: Diagnosis not present

## 2021-05-08 DIAGNOSIS — Z170 Estrogen receptor positive status [ER+]: Secondary | ICD-10-CM | POA: Diagnosis not present

## 2021-05-08 DIAGNOSIS — Z8673 Personal history of transient ischemic attack (TIA), and cerebral infarction without residual deficits: Secondary | ICD-10-CM | POA: Diagnosis not present

## 2021-05-08 DIAGNOSIS — N6310 Unspecified lump in the right breast, unspecified quadrant: Secondary | ICD-10-CM | POA: Diagnosis not present

## 2021-05-08 DIAGNOSIS — Z801 Family history of malignant neoplasm of trachea, bronchus and lung: Secondary | ICD-10-CM | POA: Insufficient documentation

## 2021-05-08 DIAGNOSIS — Z9049 Acquired absence of other specified parts of digestive tract: Secondary | ICD-10-CM | POA: Insufficient documentation

## 2021-05-08 DIAGNOSIS — Z880 Allergy status to penicillin: Secondary | ICD-10-CM | POA: Insufficient documentation

## 2021-05-08 LAB — CBC WITH DIFFERENTIAL/PLATELET
Abs Immature Granulocytes: 0.01 10*3/uL (ref 0.00–0.07)
Basophils Absolute: 0 10*3/uL (ref 0.0–0.1)
Basophils Relative: 0 %
Eosinophils Absolute: 0.1 10*3/uL (ref 0.0–0.5)
Eosinophils Relative: 1 %
HCT: 43.8 % (ref 36.0–46.0)
Hemoglobin: 14.5 g/dL (ref 12.0–15.0)
Immature Granulocytes: 0 %
Lymphocytes Relative: 30 %
Lymphs Abs: 2.8 10*3/uL (ref 0.7–4.0)
MCH: 28.3 pg (ref 26.0–34.0)
MCHC: 33.1 g/dL (ref 30.0–36.0)
MCV: 85.4 fL (ref 80.0–100.0)
Monocytes Absolute: 0.6 10*3/uL (ref 0.1–1.0)
Monocytes Relative: 6 %
Neutro Abs: 5.7 10*3/uL (ref 1.7–7.7)
Neutrophils Relative %: 63 %
Platelets: 258 10*3/uL (ref 150–400)
RBC: 5.13 MIL/uL — ABNORMAL HIGH (ref 3.87–5.11)
RDW: 13 % (ref 11.5–15.5)
WBC: 9.2 10*3/uL (ref 4.0–10.5)
nRBC: 0 % (ref 0.0–0.2)

## 2021-05-08 LAB — COMPREHENSIVE METABOLIC PANEL
ALT: 17 U/L (ref 0–44)
AST: 24 U/L (ref 15–41)
Albumin: 4.8 g/dL (ref 3.5–5.0)
Alkaline Phosphatase: 66 U/L (ref 38–126)
Anion gap: 10 (ref 5–15)
BUN: 15 mg/dL (ref 6–20)
CO2: 29 mmol/L (ref 22–32)
Calcium: 9.9 mg/dL (ref 8.9–10.3)
Chloride: 100 mmol/L (ref 98–111)
Creatinine, Ser: 0.84 mg/dL (ref 0.44–1.00)
GFR, Estimated: >60 mL/min (ref >60)
Glucose, Bld: 92 mg/dL (ref 70–99)
Potassium: 3 mmol/L — ABNORMAL LOW (ref 3.5–5.1)
Sodium: 139 mmol/L (ref 135–145)
Total Bilirubin: 0.3 mg/dL (ref 0.3–1.2)
Total Protein: 9.1 g/dL — ABNORMAL HIGH (ref 6.5–8.1)

## 2021-05-08 MED ORDER — ANASTROZOLE 1 MG PO TABS
1.0000 mg | ORAL_TABLET | Freq: Every day | ORAL | 1 refills | Status: DC
Start: 2021-05-08 — End: 2021-11-08

## 2021-05-08 MED ORDER — POTASSIUM CHLORIDE CRYS ER 20 MEQ PO TBCR: 20.0000 meq | EXTENDED_RELEASE_TABLET | Freq: Every day | ORAL | 0 refills | Status: DC

## 2021-05-08 NOTE — Progress Notes (Signed)
Hematology/Oncology progress note [facility] Telephone:(336) [phone] Fax:(336) [phone]   Patient Care Team: Sherron Monday, MD as PCP - General (Internal Medicine) [name], MD as Consulting Physician (Oncology) Carolan Shiver, MD as Consulting Physician (General Surgery) Carmina Miller, MD as Referring Physician (Radiation Oncology) Scarlett Presto, RN as Oncology Nurse Navigator (Oncology)  REFERRING PROVIDER: Sherron Monday, MD CHIEF COMPLAINTS/REASON FOR VISIT:  Follow up for breast cancer DCIS  HISTORY OF PRESENTING ILLNESS:  Lindsay Guzman is a  55 y.o.  female with PMH listed below who was referred to me for evaluation of breast cancer  Patient had bilateral diagnostic mammogram and ultrasound on 06/09/2020.  Patient previously had mammogram in 2012 and at that time right breast mass in the left breast mass for recommended.  Patient had left breast biopsy but did not proceed with right breast biopsy. 06/09/2020 bilateral diagnostic mammogram showed Biopsy pathology showed: Indeterminate medial left breast calcifications spanning 2.3 cm.  No other suspicious findings.  Medial right breast mass has been stable since 2012. 06/17/2020 patient underwent stereotactic biopsy of left breast calcifications.  Biopsy showed high-grade DCIS with comedonecrosis.  Associated with calcification.   Family history of breast cancer: Breast cancer in 3 maternal aunts Family history of other cancers: Sister had lung cancer  Menarche: 12 Menopause: history of hysterotomy.  Number of pregnancies : 3 children Age at first live childbirth:16 Used OCP: <10 years use Used estrogen and progesterone therapy: denies History of Radiation to the chest: deneis   # 07/27/2020, patient proceed with left breast lumpectomy- Dr.Cintron,  Due to the high-grade DCIS, patient opted to do sentinel lymph node biopsy at the same time of lumpectomy surgery. Pathology showed left  breast high-grade DCIS, with comedonecrosis, negative for invasive component.  No lymph node involvement.  Closest DCIS margin was 0.5 mm.  ER positive 08/12/20 re-excision of inferior margin.pathology showed negative for residual DCIS, or invasive carcinoma.   10/21/2020 finished adjuvant radiation.  January 2022, started on adjuvant Arimidex.  Remote history of hysterectomy.   Family history of breast cancer, genetic testing showed no pathogenic mutation.   INTERVAL HISTORY Lindsay Guzman is a 55 y.o. female who has above history reviewed by me today presents for follow up visit for management of left breast high-grade DCIS Problems and complaints are listed below: Patient has been on Arimidex since January 2022.  Manageable joint pain.     Review of Systems  Constitutional:  Negative for appetite change, chills, fatigue and fever.  HENT:   Negative for hearing loss and voice change.   Eyes:  Negative for eye problems.  Respiratory:  Negative for chest tightness, cough and shortness of breath.   Cardiovascular:  Negative for chest pain.  Gastrointestinal:  Negative for abdominal distention, abdominal pain and blood in stool.  Endocrine: Negative for hot flashes.  Genitourinary:  Negative for difficulty urinating and frequency.   Musculoskeletal:  Positive for arthralgias.  Skin:  Negative for itching and rash.  Neurological:  Negative for extremity weakness.  Hematological:  Negative for adenopathy.  Psychiatric/Behavioral:  Negative for confusion.     MEDICAL HISTORY:  Past Medical History:  Diagnosis Date   Arthritis    Family history of breast cancer    Family history of lung cancer    History of kidney stones    History of stroke 04/18/2020   Hypercholesteremia    Hypertension    Stroke [facility])     SURGICAL HISTORY: Past Surgical  History:  Procedure Laterality Date   ABDOMINAL HYSTERECTOMY     BREAST BIOPSY Left ?   neg   BREAST BIOPSY Left 06/17/2020   stereo  bx, x-clip, path pending   CHOLECYSTECTOMY     MASTECTOMY, PARTIAL Left 08/12/2020   Procedure: RE-EXCISION OF INFERIOR MARGIN OF LEFT BREAST, DRAINAGE OF SEROMA OF LEFT BREAST;  Surgeon: Carolan Shiver, MD;  Location: [facility] ORS;  Service: General;  Laterality: Left;    SOCIAL HISTORY: Social History   Socioeconomic History   Marital status: Married    Spouse name: Not on file   Number of children: Not on file   Years of education: Not on file   Highest education level: Not on file  Occupational History   Not on file  Tobacco Use   Smoking status: Former    Types: Cigarettes    Quit date: 04/18/2020    Years since quitting: 1.0   Smokeless tobacco: Never  Substance and Sexual Activity   Alcohol use: Yes    Comment: occasionally    Drug use: Yes    Types: Marijuana   Sexual activity: Not on file  Other Topics Concern   Not on file  Social History Narrative   Not on file   Social Determinants of Health   Financial Resource Strain: Not on file  Food Insecurity: Not on file  Transportation Needs: Not on file  Physical Activity: Not on file  Stress: Not on file  Social Connections: Not on file  Intimate Partner Violence: Not on file    FAMILY HISTORY: Family History  Problem Relation Age of Onset   Breast cancer Maternal Aunt    Breast cancer Maternal Aunt    Breast cancer Maternal Aunt    Lung cancer Maternal Uncle    Cancer Paternal Aunt        unk type   Lung cancer Maternal Aunt     ALLERGIES:  is allergic to other and penicillins.  MEDICATIONS:  Current Outpatient Medications  Medication Sig Dispense Refill   aspirin EC 81 MG tablet Take 81 mg by mouth daily. Swallow whole.      calcium carbonate (OSCAL) 1500 (600 Ca) MG TABS tablet Take by mouth 2 (two) times daily with a meal.     cholecalciferol (VITAMIN D3) 25 MCG (1000 UNIT) tablet Take 1,000 Units by mouth daily.     hydrALAZINE (APRESOLINE) 100 MG tablet Take 100 mg by mouth 2 (two) times  daily as needed.     Multiple Vitamins-Minerals (CENTRUM SILVER ADULT 50+ PO) Take 1 tablet by mouth daily.     Olmesartan-amLODIPine-HCTZ 40-10-25 MG TABS Take 1 tablet by mouth every morning.     potassium chloride SA (KLOR-CON) 20 MEQ tablet Take 1 tablet (20 mEq total) by mouth daily. 14 tablet 0   rosuvastatin (CRESTOR) 20 MG tablet Take 20 mg by mouth daily.     anastrozole (ARIMIDEX) 1 MG tablet Take 1 tablet (1 mg total) by mouth daily. 90 tablet 1   No current facility-administered medications for this visit.     PHYSICAL EXAMINATION: ECOG PERFORMANCE STATUS: 0 - Asymptomatic Vitals:   05/08/21 1420  BP: (!) 133/94  Pulse: 77  Resp: 18  Temp: 98.3 F (36.8 C)  SpO2: 100%   Filed Weights   05/08/21 1420  Weight: 183 lb 8 oz (83.2 kg)    Physical Exam Constitutional:      General: She is not in acute distress.    Appearance: She is  not diaphoretic.  HENT:     Head: Normocephalic and atraumatic.     Nose: Nose normal.     Mouth/Throat:     Pharynx: No oropharyngeal exudate.  Eyes:     General: No scleral icterus.    Pupils: Pupils are equal, round, and reactive to light.  Cardiovascular:     Rate and Rhythm: Normal rate and regular rhythm.     Heart sounds: No murmur heard. Pulmonary:     Effort: Pulmonary effort is normal. No respiratory distress.     Breath sounds: No rales.  Chest:     Chest wall: No tenderness.  Abdominal:     General: There is no distension.     Palpations: Abdomen is soft.     Tenderness: There is no abdominal tenderness.  Musculoskeletal:        General: Normal range of motion.     Cervical back: Normal range of motion and neck supple.  Skin:    General: Skin is warm and dry.     Findings: No erythema.  Neurological:     Mental Status: She is alert and oriented to person, place, and time.     Cranial Nerves: No cranial nerve deficit.     Motor: No abnormal muscle tone.     Coordination: Coordination normal.  Psychiatric:         Mood and Affect: Affect normal.     LABORATORY DATA:  I have reviewed the data as listed Lab Results  Component Value Date   WBC 9.2 05/08/2021   HGB 14.5 05/08/2021   HCT 43.8 05/08/2021   MCV 85.4 05/08/2021   PLT 258 05/08/2021   Recent Labs    06/28/20 1544 08/10/20 1033 11/08/20 1512 12/20/20 0952 05/08/21 1337  NA 138 135  --  140 139  K 3.6 3.3*  --  3.4* 3.0*  CL 97* 98  --  103 100  CO2 30 26  --  25 29  GLUCOSE 86 92  --  88 92  BUN 16 14  --  11 15  CREATININE 0.75 0.90  --  0.72 0.84  CALCIUM 9.9 9.4  --  9.4 9.9  GFRNONAA >60 >60  --  >60 >60  GFRAA >60  --   --   --   --   PROT 8.3* 8.6* 8.3* 8.4* 9.1*  ALBUMIN 4.5 4.4 4.2 4.3 4.8  AST 26 22 23 25 24   ALT 20 17 18 18 17   ALKPHOS 57 71 63 58 66  BILITOT 0.5 0.7 0.5 0.6 0.3  BILIDIR  --   --  <0.1  --   --   IBILI  --   --  NOT CALCULATED  --   --     Iron/TIBC/Ferritin/ %Sat No results found for: IRON, TIBC, FERRITIN, IRONPCTSAT      ASSESSMENT & PLAN:  1. Ductal carcinoma in situ (DCIS) of left breast   2. Aromatase inhibitor use    Left breast high-grade DCIS, ER positive, status post lumpectomy and optional sentinel lymph node biopsy and re-excision of margin.  pTis pN0.  Labs are reviewed and discussed with patient. Continue Arimidex.  Obtain bone density. Recommend patient to continue calcium and vitamin D supplementation.   Orders Placed This Encounter  Procedures   Comprehensive metabolic panel    Standing Status:   Future    Standing Expiration Date:   05/08/2022   CBC with Differential/Platelet    Standing Status:   Future  Standing Expiration Date:   05/08/2022    All questions were answered. The patient knows to call the clinic with any problems questions or concerns.  Return of visit 6 months  Rickard PatienceZhou Margaret Cockerill, MD, PhD Hematology Oncology [facility] Pager- [phone] 05/08/2021

## 2021-05-18 ENCOUNTER — Other Ambulatory Visit: Payer: Self-pay

## 2021-05-18 ENCOUNTER — Ambulatory Visit
Admission: RE | Admit: 2021-05-18 | Discharge: 2021-05-18 | Disposition: A | Discharge status: Home / Self Care | Payer: PRIVATE HEALTH INSURANCE | Source: Ambulatory Visit | Attending: Oncology | Admitting: Oncology

## 2021-05-18 DIAGNOSIS — D0512 Intraductal carcinoma in situ of left breast: Secondary | ICD-10-CM | POA: Insufficient documentation

## 2021-05-29 ENCOUNTER — Telehealth: Payer: Self-pay

## 2021-05-29 NOTE — Telephone Encounter (Signed)
Spoke to patient and she is agreeable with starting zometa. She does not have a dentist and I notified her that is it recommended that she establishes care with dentist and that she gets evaluated before proceeding with zometa. She voiced understanding and will call back once she is established with dentist.

## 2021-05-29 NOTE — Telephone Encounter (Signed)
Unable to reach pt by phone. Detailed message left for her to call back.

## 2021-05-29 NOTE — Telephone Encounter (Signed)
-----   Message from [name], MD sent at 05/27/2021 11:36 PM EDT ----- DEXA showed Osteoporosis I recommend bone strengthening medication zometa.  If she agrees. Please ask her to obtain dental clearance.

## 2021-06-12 ENCOUNTER — Other Ambulatory Visit: Payer: Self-pay

## 2021-06-12 ENCOUNTER — Ambulatory Visit
Admission: RE | Admit: 2021-06-12 | Discharge: 2021-06-12 | Disposition: A | Discharge status: Home / Self Care | Payer: PRIVATE HEALTH INSURANCE | Source: Ambulatory Visit | Attending: General Surgery | Admitting: General Surgery

## 2021-06-12 DIAGNOSIS — Z853 Personal history of malignant neoplasm of breast: Secondary | ICD-10-CM | POA: Insufficient documentation

## 2021-06-12 HISTORY — DX: Personal history of irradiation: Z92.3

## 2021-06-12 IMAGING — MG DIGITAL DIAGNOSTIC BILAT W/ TOMO W/ CAD
8 of 12 series · 8 of 28 positions shown · non-contrast
Comparison: Previous exam(s).

CLINICAL DATA: Initial mammogram status post left lumpectomy and
radiation [DATE].

EXAM:
DIGITAL DIAGNOSTIC BILATERAL MAMMOGRAM WITH TOMOSYNTHESIS AND CAD
TECHNIQUE: Bilateral digital diagnostic mammography and breast tomosynthesis
was performed. The images were evaluated with computer-aided
detection.

[L ML (1 of 2)]
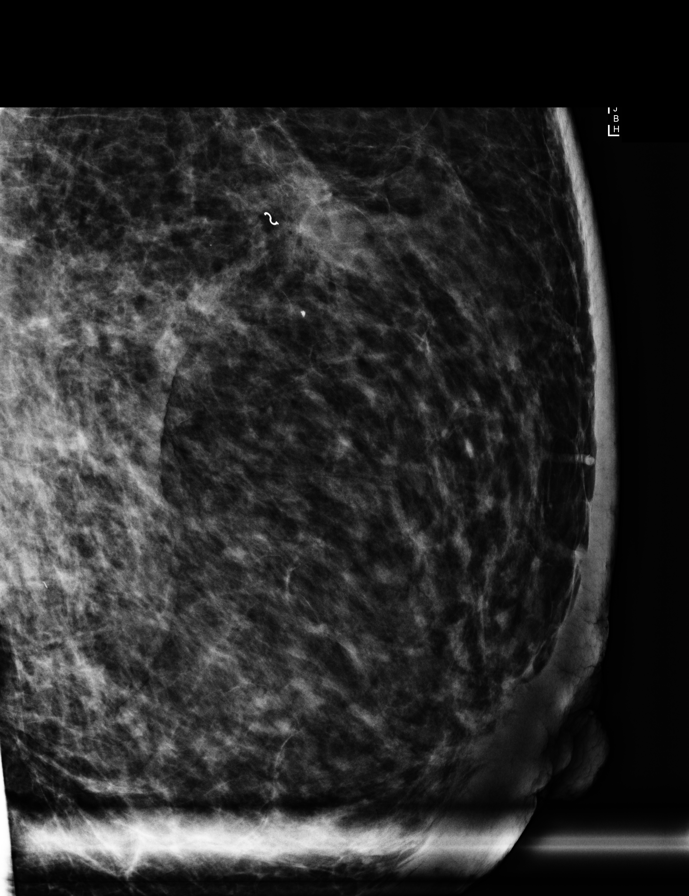

[L ML (2 of 2)]
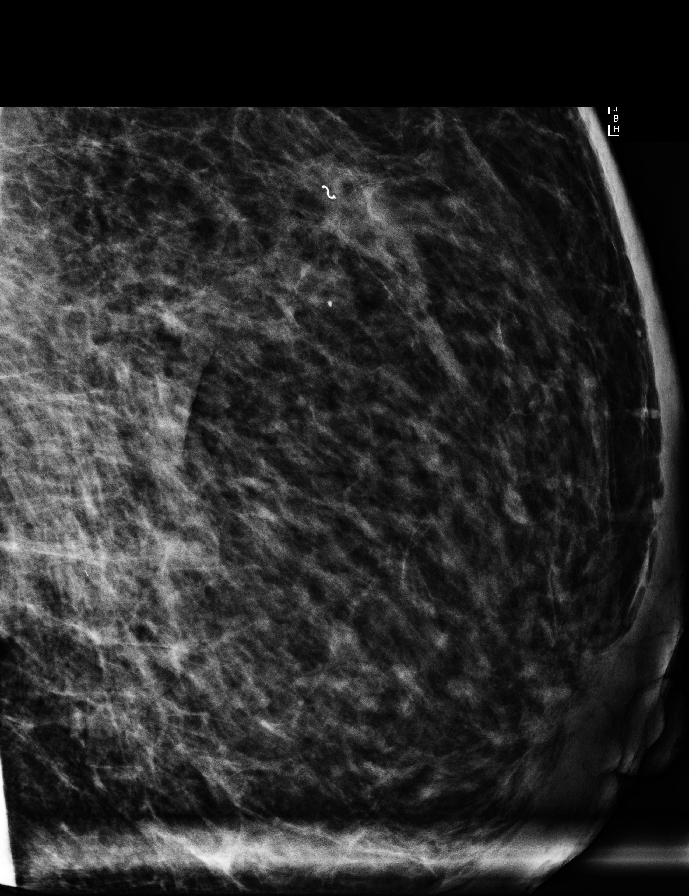

[L CC (1 of 2)]
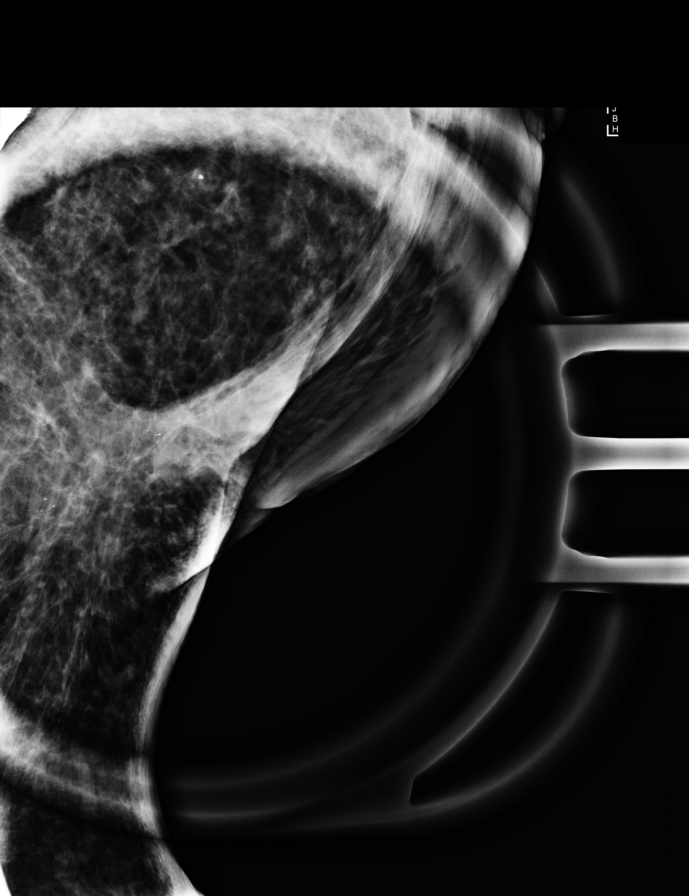

[L CC (2 of 2)]
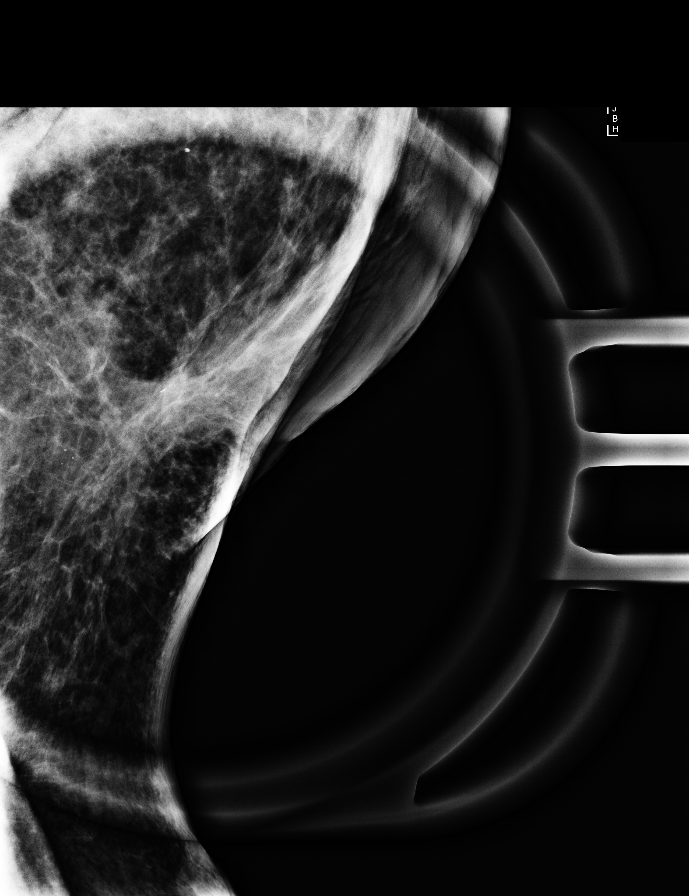

[L CC synth-2D]
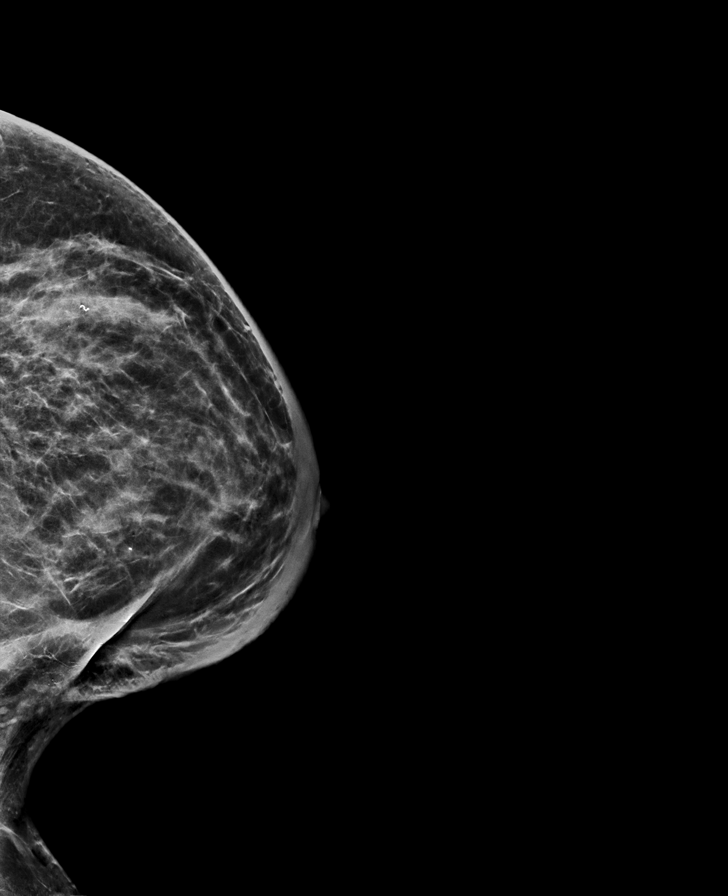

[L MLO synth-2D]
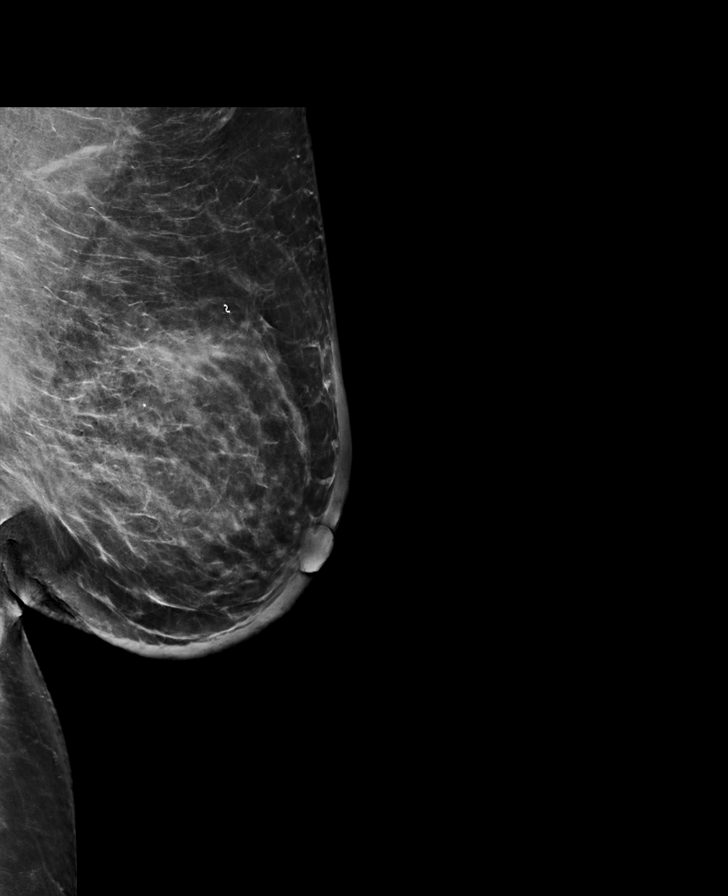

[R MLO synth-2D]
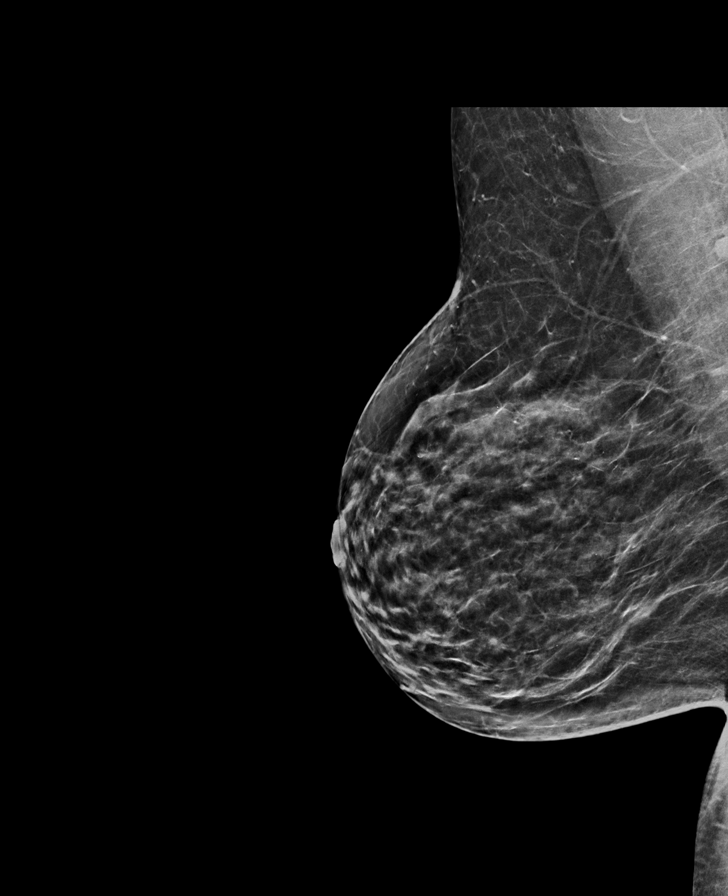

[R CC synth-2D]
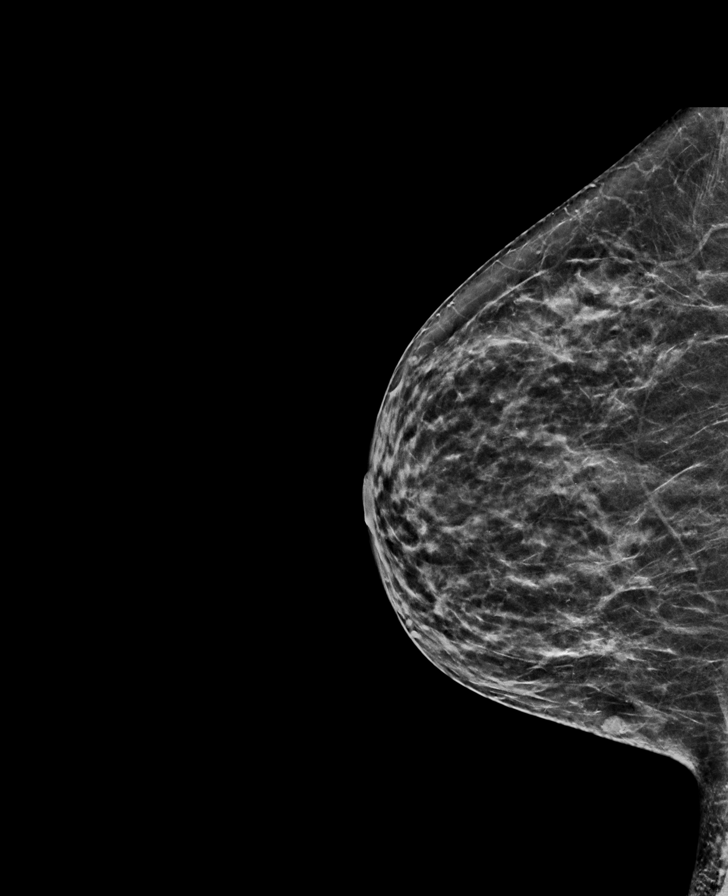

[8 of 28 positions shown; findings below may reference images not displayed]

ACR Breast Density Category c: The breast tissue is heterogeneously
dense, which may obscure small masses.
FINDINGS: Interval postoperative changes in the lower inner left breast at
posterior depth. Diffuse skin and trabecular thickening consistent
with postradiation treatment changes in the left breast. No new
suspicious masses, calcifications or other findings in either
breast.
IMPRESSION: Interval postoperative changes in the lower inner left breast at
posterior depth. No mammographic findings of malignancy in either
breast.

RECOMMENDATION:
Diagnostic bilateral mammogram in 12 months.

I have discussed the findings and recommendations with the patient.
If applicable, a reminder letter will be sent to the patient
regarding the next appointment.

BI-RADS CATEGORY  2: Benign.

## 2021-08-24 ENCOUNTER — Encounter: Payer: Self-pay | Admitting: *Deleted

## 2021-09-27 ENCOUNTER — Ambulatory Visit: Payer: PRIVATE HEALTH INSURANCE | Admitting: Radiation Oncology

## 2021-10-04 ENCOUNTER — Ambulatory Visit
Admission: RE | Admit: 2021-10-04 | Discharge: 2021-10-04 | Disposition: A | Discharge status: Home / Self Care | Payer: PRIVATE HEALTH INSURANCE | Source: Ambulatory Visit | Attending: Radiation Oncology | Admitting: Radiation Oncology

## 2021-10-04 ENCOUNTER — Other Ambulatory Visit: Payer: Self-pay

## 2021-10-04 ENCOUNTER — Encounter: Payer: Self-pay | Admitting: Radiation Oncology

## 2021-10-04 VITALS — BP 144/98 | HR 86 | Temp 96.4°F | Wt 184.4 lb

## 2021-10-04 DIAGNOSIS — D0512 Intraductal carcinoma in situ of left breast: Secondary | ICD-10-CM | POA: Insufficient documentation

## 2021-10-04 DIAGNOSIS — Z79811 Long term (current) use of aromatase inhibitors: Secondary | ICD-10-CM | POA: Insufficient documentation

## 2021-10-04 DIAGNOSIS — Z923 Personal history of irradiation: Secondary | ICD-10-CM | POA: Diagnosis not present

## 2021-10-04 DIAGNOSIS — Z170 Estrogen receptor positive status [ER+]: Secondary | ICD-10-CM | POA: Insufficient documentation

## 2021-10-04 NOTE — Progress Notes (Signed)
Radiation Oncology Follow up Note  Name: Lindsay Guzman   Date:   10/04/2021 MRN:  [id number] DOB: 07-02-1966    This 55 y.o. 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  REFERRING PROVIDER: Sherron Monday, MD  HPI: Patient is a 55 year old female now out 1 year having pleated whole breast radiation to her left breast for ductal carcinoma site to ER positive seen today in routine follow-up she is doing well.  She specifically denies breast tenderness cough or bone pain..  She is currently on Arimidex tolerant at well without side effect.  She had mammograms back in August which I have reviewed were BI-RADS 2 benign.  COMPLICATIONS OF TREATMENT: none  FOLLOW UP COMPLIANCE: keeps appointments   PHYSICAL EXAM:  BP (!) 144/98    Pulse 86    Temp (!) 96.4 F (35.8 C) (Tympanic)    Wt 184 lb 6.4 oz (83.6 kg)    BMI 31.16 kg/m  Lungs are clear to A&P cardiac examination essentially unremarkable with regular rate and rhythm. No dominant mass or nodularity is noted in either breast in 2 positions examined. Incision is well-healed. No axillary or supraclavicular adenopathy is appreciated. Cosmetic result is excellent.  Well-developed well-nourished patient in NAD. HEENT reveals PERLA, EOMI, discs not visualized.  Oral cavity is clear. No oral mucosal lesions are identified. Neck is clear without evidence of cervical or supraclavicular adenopathy. Lungs are clear to A&P. Cardiac examination is essentially unremarkable with regular rate and rhythm without murmur rub or thrill. Abdomen is benign with no organomegaly or masses noted. Motor sensory and DTR levels are equal and symmetric in the upper and lower extremities. Cranial nerves II through XII are grossly intact. Proprioception is intact. No peripheral adenopathy or edema is identified. No motor or sensory levels are noted. Crude visual  fields are within normal range.  RADIOLOGY RESULTS: Mammograms reviewed compatible with above-stated findings  PLAN: Present time patient is doing well 1 year out with no evidence of disease.  On pleased with her overall progress.  I have asked to see her back in 1 year for follow-up.  Patient knows to call with any concerns.  I would like to take this opportunity to thank you for allowing me to participate in the care of your patient.Carmina Miller, MD

## 2021-11-08 ENCOUNTER — Encounter (INDEPENDENT_AMBULATORY_CARE_PROVIDER_SITE_OTHER): Payer: Self-pay

## 2021-11-08 ENCOUNTER — Other Ambulatory Visit: Payer: Self-pay

## 2021-11-08 ENCOUNTER — Encounter: Payer: Self-pay | Admitting: Oncology

## 2021-11-08 ENCOUNTER — Inpatient Hospital Stay: Payer: BC Managed Care – PPO | Attending: Oncology

## 2021-11-08 ENCOUNTER — Inpatient Hospital Stay (HOSPITAL_BASED_OUTPATIENT_CLINIC_OR_DEPARTMENT_OTHER): Payer: BC Managed Care – PPO | Admitting: Oncology

## 2021-11-08 VITALS — BP 126/90 | HR 79 | Temp 97.1°F | Resp 18 | Wt 186.3 lb

## 2021-11-08 DIAGNOSIS — Z79811 Long term (current) use of aromatase inhibitors: Secondary | ICD-10-CM | POA: Insufficient documentation

## 2021-11-08 DIAGNOSIS — Z90710 Acquired absence of both cervix and uterus: Secondary | ICD-10-CM | POA: Insufficient documentation

## 2021-11-08 DIAGNOSIS — M810 Age-related osteoporosis without current pathological fracture: Secondary | ICD-10-CM | POA: Insufficient documentation

## 2021-11-08 DIAGNOSIS — Z79899 Other long term (current) drug therapy: Secondary | ICD-10-CM | POA: Diagnosis not present

## 2021-11-08 DIAGNOSIS — Z880 Allergy status to penicillin: Secondary | ICD-10-CM | POA: Diagnosis not present

## 2021-11-08 DIAGNOSIS — Z170 Estrogen receptor positive status [ER+]: Secondary | ICD-10-CM | POA: Diagnosis not present

## 2021-11-08 DIAGNOSIS — Z801 Family history of malignant neoplasm of trachea, bronchus and lung: Secondary | ICD-10-CM | POA: Diagnosis not present

## 2021-11-08 DIAGNOSIS — M818 Other osteoporosis without current pathological fracture: Secondary | ICD-10-CM

## 2021-11-08 DIAGNOSIS — Z803 Family history of malignant neoplasm of breast: Secondary | ICD-10-CM | POA: Insufficient documentation

## 2021-11-08 DIAGNOSIS — Z923 Personal history of irradiation: Secondary | ICD-10-CM | POA: Insufficient documentation

## 2021-11-08 DIAGNOSIS — Z809 Family history of malignant neoplasm, unspecified: Secondary | ICD-10-CM | POA: Diagnosis not present

## 2021-11-08 DIAGNOSIS — Z8673 Personal history of transient ischemic attack (TIA), and cerebral infarction without residual deficits: Secondary | ICD-10-CM | POA: Diagnosis not present

## 2021-11-08 DIAGNOSIS — D0512 Intraductal carcinoma in situ of left breast: Secondary | ICD-10-CM | POA: Diagnosis present

## 2021-11-08 DIAGNOSIS — Z9049 Acquired absence of other specified parts of digestive tract: Secondary | ICD-10-CM | POA: Insufficient documentation

## 2021-11-08 DIAGNOSIS — Z87442 Personal history of urinary calculi: Secondary | ICD-10-CM | POA: Diagnosis not present

## 2021-11-08 LAB — CBC WITH DIFFERENTIAL/PLATELET
Abs Immature Granulocytes: 0.02 10*3/uL (ref 0.00–0.07)
Basophils Absolute: 0.1 10*3/uL (ref 0.0–0.1)
Basophils Relative: 1 %
Eosinophils Absolute: 0.1 10*3/uL (ref 0.0–0.5)
Eosinophils Relative: 2 %
HCT: 39.3 % (ref 36.0–46.0)
Hemoglobin: 13.2 g/dL (ref 12.0–15.0)
Immature Granulocytes: 0 %
Lymphocytes Relative: 32 %
Lymphs Abs: 2.6 10*3/uL (ref 0.7–4.0)
MCH: 28.1 pg (ref 26.0–34.0)
MCHC: 33.6 g/dL (ref 30.0–36.0)
MCV: 83.8 fL (ref 80.0–100.0)
Monocytes Absolute: 0.6 10*3/uL (ref 0.1–1.0)
Monocytes Relative: 7 %
Neutro Abs: 4.7 10*3/uL (ref 1.7–7.7)
Neutrophils Relative %: 58 %
Platelets: 240 10*3/uL (ref 150–400)
RBC: 4.69 MIL/uL (ref 3.87–5.11)
RDW: 13.4 % (ref 11.5–15.5)
WBC: 8 10*3/uL (ref 4.0–10.5)
nRBC: 0 % (ref 0.0–0.2)

## 2021-11-08 LAB — COMPREHENSIVE METABOLIC PANEL
ALT: 16 U/L (ref 0–44)
AST: 21 U/L (ref 15–41)
Albumin: 4.4 g/dL (ref 3.5–5.0)
Alkaline Phosphatase: 66 U/L (ref 38–126)
Anion gap: 10 (ref 5–15)
BUN: 14 mg/dL (ref 6–20)
CO2: 29 mmol/L (ref 22–32)
Calcium: 9.7 mg/dL (ref 8.9–10.3)
Chloride: 98 mmol/L (ref 98–111)
Creatinine, Ser: 0.8 mg/dL (ref 0.44–1.00)
GFR, Estimated: >60 mL/min (ref >60)
Glucose, Bld: 99 mg/dL (ref 70–99)
Potassium: 3.3 mmol/L — ABNORMAL LOW (ref 3.5–5.1)
Sodium: 137 mmol/L (ref 135–145)
Total Bilirubin: 0.3 mg/dL (ref 0.3–1.2)
Total Protein: 8.2 g/dL — ABNORMAL HIGH (ref 6.5–8.1)

## 2021-11-08 MED ORDER — ANASTROZOLE 1 MG PO TABS: 1.0000 mg | ORAL_TABLET | Freq: Every day | ORAL | 1 refills | Status: DC

## 2021-11-08 NOTE — Progress Notes (Signed)
Pt here for follow up. No new breast concerns. Pt requesting anastrazole refill.

## 2021-11-08 NOTE — Progress Notes (Signed)
Hematology/Oncology progress note  [phone] [phone]   Patient Care Team: Sherron Monday, MD as PCP - General (Internal Medicine) [name], MD as Consulting Physician (Oncology) Carolan Shiver, MD as Consulting Physician (General Surgery) Carmina Miller, MD as Referring Physician (Radiation Oncology) Scarlett Presto, RN as Oncology Nurse Navigator (Oncology)  REFERRING PROVIDER: Sherron Monday, MD CHIEF COMPLAINTS/REASON FOR VISIT:  Follow up for breast cancer DCIS  HISTORY OF PRESENTING ILLNESS:  Lindsay Guzman is a  55 y.o.  female with PMH listed below who was referred to me for evaluation of breast cancer  Patient had bilateral diagnostic mammogram and ultrasound on 06/09/2020.  Patient previously had mammogram in 2012 and at that time right breast mass in the left breast mass for recommended.  Patient had left breast biopsy but did not proceed with right breast biopsy. 06/09/2020 bilateral diagnostic mammogram showed Biopsy pathology showed: Indeterminate medial left breast calcifications spanning 2.3 cm.  No other suspicious findings.  Medial right breast mass has been stable since 2012. 06/17/2020 patient underwent stereotactic biopsy of left breast calcifications.  Biopsy showed high-grade DCIS with comedonecrosis.  Associated with calcification.   Family history of breast cancer: Breast cancer in 3 maternal aunts Family history of other cancers: Sister had lung cancer  Menarche: 12 Menopause: history of hysterotomy.  Number of pregnancies : 3 children Age at first live childbirth:16 Used OCP: <10 years use Used estrogen and progesterone therapy: denies History of Radiation to the chest: deneis   # 07/27/2020, patient proceed with left breast lumpectomy- Dr.Cintron,  Due to the high-grade DCIS, patient opted to do sentinel lymph node biopsy at the same time of lumpectomy surgery. Pathology showed left breast high-grade DCIS, with  comedonecrosis, negative for invasive component.  No lymph node involvement.  Closest DCIS margin was 0.5 mm.  ER positive 08/12/20 re-excision of inferior margin.pathology showed negative for residual DCIS, or invasive carcinoma.   10/21/2020 finished adjuvant radiation.  January 2022, started on adjuvant Arimidex.  Remote history of hysterectomy.   Family history of breast cancer, genetic testing showed no pathogenic mutation.   INTERVAL HISTORY Lindsay Guzman is a 56 y.o. female who has above history reviewed by me today presents for follow up visit for management of left breast high-grade DCIS Problems and complaints are listed below: Patient has been on Arimidex since January 2022.  Overall she tolerates well.  Manageable side effects.  She request refill of Arimidex.    Review of Systems  Constitutional:  Negative for appetite change, chills, fatigue and fever.  HENT:   Negative for hearing loss and voice change.   Eyes:  Negative for eye problems.  Respiratory:  Negative for chest tightness, cough and shortness of breath.   Cardiovascular:  Negative for chest pain.  Gastrointestinal:  Negative for abdominal distention, abdominal pain and blood in stool.  Endocrine: Negative for hot flashes.  Genitourinary:  Negative for difficulty urinating and frequency.   Musculoskeletal:  Positive for arthralgias.  Skin:  Negative for itching and rash.  Neurological:  Negative for extremity weakness.  Hematological:  Negative for adenopathy.  Psychiatric/Behavioral:  Negative for confusion.     MEDICAL HISTORY:  Past Medical History:  Diagnosis Date   Arthritis    Family history of breast cancer    Family history of lung cancer    History of kidney stones    History of stroke 04/18/2020   Hypercholesteremia    Hypertension    Personal history of  radiation therapy    Stroke [facility](HCC)     SURGICAL HISTORY: Past Surgical History:  Procedure Laterality Date   ABDOMINAL HYSTERECTOMY      BREAST BIOPSY Left ?   neg   BREAST BIOPSY Left 06/17/2020   stereo bx, x-clip, positive   BREAST LUMPECTOMY Left 07/25/2020   CHOLECYSTECTOMY     MASTECTOMY, PARTIAL Left 08/12/2020   Procedure: RE-EXCISION OF INFERIOR MARGIN OF LEFT BREAST, DRAINAGE OF SEROMA OF LEFT BREAST;  Surgeon: Carolan Shiverintron-Diaz, Edgardo, MD;  Location: [facility] ORS;  Service: General;  Laterality: Left;    SOCIAL HISTORY: Social History   Socioeconomic History   Marital status: Married    Spouse name: Not on file   Number of children: Not on file   Years of education: Not on file   Highest education level: Not on file  Occupational History   Not on file  Tobacco Use   Smoking status: Former    Types: Cigarettes    Quit date: 04/18/2020    Years since quitting: 1.5   Smokeless tobacco: Never  Substance and Sexual Activity   Alcohol use: Yes    Comment: occasionally    Drug use: Yes    Types: Marijuana   Sexual activity: Not on file  Other Topics Concern   Not on file  Social History Narrative   Not on file   Social Determinants of Health   Financial Resource Strain: Not on file  Food Insecurity: Not on file  Transportation Needs: Not on file  Physical Activity: Not on file  Stress: Not on file  Social Connections: Not on file  Intimate Partner Violence: Not on file    FAMILY HISTORY: Family History  Problem Relation Age of Onset   Breast cancer Maternal Aunt    Breast cancer Maternal Aunt    Breast cancer Maternal Aunt    Lung cancer Maternal Uncle    Cancer Paternal Aunt        unk type   Lung cancer Maternal Aunt     ALLERGIES:  is allergic to other and penicillins.  MEDICATIONS:  Current Outpatient Medications  Medication Sig Dispense Refill   aspirin EC 81 MG tablet Take 81 mg by mouth daily. Swallow whole.      calcium carbonate (OSCAL) 1500 (600 Ca) MG TABS tablet Take by mouth 2 (two) times daily with a meal.     cholecalciferol (VITAMIN D3) 25 MCG (1000 UNIT) tablet Take  1,000 Units by mouth daily.     hydrALAZINE (APRESOLINE) 100 MG tablet Take 100 mg by mouth 2 (two) times daily as needed.     Multiple Vitamins-Minerals (CENTRUM SILVER ADULT 50+ PO) Take 1 tablet by mouth daily.     Olmesartan-amLODIPine-HCTZ 40-10-25 MG TABS Take 1 tablet by mouth every morning.     rosuvastatin (CRESTOR) 20 MG tablet Take 40 mg by mouth daily.     anastrozole (ARIMIDEX) 1 MG tablet Take 1 tablet (1 mg total) by mouth daily. 90 tablet 1   No current facility-administered medications for this visit.     PHYSICAL EXAMINATION: ECOG PERFORMANCE STATUS: 0 - Asymptomatic Vitals:   11/08/21 0956  BP: 126/90  Pulse: 79  Resp: 18  Temp: (!) 97.1 F (36.2 C)   Filed Weights   11/08/21 0956  Weight: 186 lb 4.8 oz (84.5 kg)    Physical Exam Constitutional:      General: She is not in acute distress.    Appearance: She is not diaphoretic.  HENT:  Head: Normocephalic and atraumatic.     Nose: Nose normal.     Mouth/Throat:     Pharynx: No oropharyngeal exudate.  Eyes:     General: No scleral icterus.    Pupils: Pupils are equal, round, and reactive to light.  Cardiovascular:     Rate and Rhythm: Normal rate and regular rhythm.     Heart sounds: No murmur heard. Pulmonary:     Effort: Pulmonary effort is normal. No respiratory distress.     Breath sounds: No rales.  Chest:     Chest wall: No tenderness.  Abdominal:     General: There is no distension.     Palpations: Abdomen is soft.     Tenderness: There is no abdominal tenderness.  Musculoskeletal:        General: Normal range of motion.     Cervical back: Normal range of motion and neck supple.  Skin:    General: Skin is warm and dry.     Findings: No erythema.  Neurological:     Mental Status: She is alert and oriented to person, place, and time.     Cranial Nerves: No cranial nerve deficit.     Motor: No abnormal muscle tone.     Coordination: Coordination normal.  Psychiatric:        Mood  and Affect: Affect normal.     LABORATORY DATA:  I have reviewed the data as listed Lab Results  Component Value Date   WBC 8.0 11/08/2021   HGB 13.2 11/08/2021   HCT 39.3 11/08/2021   MCV 83.8 11/08/2021   PLT 240 11/08/2021   Recent Labs    12/20/20 0952 05/08/21 1337 11/08/21 0941  NA 140 139 137  K 3.4* 3.0* 3.3*  CL 103 100 98  CO2 25 29 29   GLUCOSE 88 92 99  BUN 11 15 14   CREATININE 0.72 0.84 0.80  CALCIUM 9.4 9.9 9.7  GFRNONAA >60 >60 >60  PROT 8.4* 9.1* 8.2*  ALBUMIN 4.3 4.8 4.4  AST 25 24 21   ALT 18 17 16   ALKPHOS 58 66 66  BILITOT 0.6 0.3 0.3    Iron/TIBC/Ferritin/ %Sat No results found for: IRON, TIBC, FERRITIN, IRONPCTSAT      ASSESSMENT & PLAN:  1. Ductal carcinoma in situ (DCIS) of left breast   2. Aromatase inhibitor use   3. Other osteoporosis without current pathological fracture    Left breast high-grade DCIS, ER positive, status post lumpectomy and optional sentinel lymph node biopsy and re-excision of margin.  pTis pN0.  Labs reviewed and discussed with patient. Continue Arimidex 1 mg daily.  Refills were sent to pharmacy. 06/12/2021 bilateral diagnostic mammogram reviewed and discussed with patient.  Obtain bilateral diagnostic mammogram in August 2023.  Osteoporosis -05/18/2021 bone density showed left femoral neck T- 2.6. Rationale and potential side effects of bisphosphonate was discussed with patient.  I recommend dental clearance.  Patient would like to defer bisphosphonate treatment at this point.  Recommend patient to take calcium and vitamin D supplementation.  Orders Placed This Encounter  Procedures   MM DIAG BREAST TOMO BILATERAL    Standing Status:   Future    Standing Expiration Date:   11/08/2022    Order Specific Question:   Reason for Exam (SYMPTOM  OR DIAGNOSIS REQUIRED)    Answer:   DCIS    Order Specific Question:   Is the patient pregnant?    Answer:   No    Order Specific Question:  Preferred imaging location?     Answer:   [facility]   CBC with Differential/Platelet    Standing Status:   Future    Standing Expiration Date:   11/08/2022   Comprehensive metabolic panel    Standing Status:   Future    Standing Expiration Date:   11/08/2022    All questions were answered. The patient knows to call the clinic with any problems questions or concerns.  Return of visit 6 months  [name], MD, PhD  11/08/2021

## 2022-05-08 ENCOUNTER — Encounter: Payer: Self-pay | Admitting: Oncology

## 2022-05-08 ENCOUNTER — Inpatient Hospital Stay (HOSPITAL_BASED_OUTPATIENT_CLINIC_OR_DEPARTMENT_OTHER): Payer: BC Managed Care – PPO | Admitting: Oncology

## 2022-05-08 ENCOUNTER — Inpatient Hospital Stay: Payer: BC Managed Care – PPO | Attending: Oncology

## 2022-05-08 VITALS — BP 122/87 | HR 86 | Temp 96.8°F | Resp 18 | Wt 183.3 lb

## 2022-05-08 DIAGNOSIS — D0512 Intraductal carcinoma in situ of left breast: Secondary | ICD-10-CM | POA: Insufficient documentation

## 2022-05-08 DIAGNOSIS — Z8673 Personal history of transient ischemic attack (TIA), and cerebral infarction without residual deficits: Secondary | ICD-10-CM | POA: Insufficient documentation

## 2022-05-08 DIAGNOSIS — M810 Age-related osteoporosis without current pathological fracture: Secondary | ICD-10-CM | POA: Diagnosis not present

## 2022-05-08 DIAGNOSIS — Z79811 Long term (current) use of aromatase inhibitors: Secondary | ICD-10-CM | POA: Insufficient documentation

## 2022-05-08 DIAGNOSIS — Z801 Family history of malignant neoplasm of trachea, bronchus and lung: Secondary | ICD-10-CM | POA: Diagnosis not present

## 2022-05-08 DIAGNOSIS — Z803 Family history of malignant neoplasm of breast: Secondary | ICD-10-CM | POA: Insufficient documentation

## 2022-05-08 DIAGNOSIS — Z880 Allergy status to penicillin: Secondary | ICD-10-CM | POA: Insufficient documentation

## 2022-05-08 DIAGNOSIS — Z87442 Personal history of urinary calculi: Secondary | ICD-10-CM | POA: Insufficient documentation

## 2022-05-08 DIAGNOSIS — Z87891 Personal history of nicotine dependence: Secondary | ICD-10-CM | POA: Diagnosis not present

## 2022-05-08 DIAGNOSIS — N6315 Unspecified lump in the right breast, overlapping quadrants: Secondary | ICD-10-CM | POA: Insufficient documentation

## 2022-05-08 DIAGNOSIS — M2550 Pain in unspecified joint: Secondary | ICD-10-CM | POA: Insufficient documentation

## 2022-05-08 DIAGNOSIS — Z9049 Acquired absence of other specified parts of digestive tract: Secondary | ICD-10-CM | POA: Insufficient documentation

## 2022-05-08 DIAGNOSIS — Z79899 Other long term (current) drug therapy: Secondary | ICD-10-CM | POA: Insufficient documentation

## 2022-05-08 DIAGNOSIS — Z923 Personal history of irradiation: Secondary | ICD-10-CM | POA: Insufficient documentation

## 2022-05-08 DIAGNOSIS — Z809 Family history of malignant neoplasm, unspecified: Secondary | ICD-10-CM | POA: Insufficient documentation

## 2022-05-08 LAB — COMPREHENSIVE METABOLIC PANEL
ALT: 19 U/L (ref 0–44)
AST: 27 U/L (ref 15–41)
Albumin: 4.4 g/dL (ref 3.5–5.0)
Alkaline Phosphatase: 58 U/L (ref 38–126)
Anion gap: 8 (ref 5–15)
BUN: 11 mg/dL (ref 6–20)
CO2: 27 mmol/L (ref 22–32)
Calcium: 9.9 mg/dL (ref 8.9–10.3)
Chloride: 103 mmol/L (ref 98–111)
Creatinine, Ser: 0.83 mg/dL (ref 0.44–1.00)
GFR, Estimated: >60 mL/min (ref >60)
Glucose, Bld: 95 mg/dL (ref 70–99)
Potassium: 3.1 mmol/L — ABNORMAL LOW (ref 3.5–5.1)
Sodium: 138 mmol/L (ref 135–145)
Total Bilirubin: 0.6 mg/dL (ref 0.3–1.2)
Total Protein: 8.4 g/dL — ABNORMAL HIGH (ref 6.5–8.1)

## 2022-05-08 LAB — CBC WITH DIFFERENTIAL/PLATELET
Abs Immature Granulocytes: 0.02 10*3/uL (ref 0.00–0.07)
Basophils Absolute: 0 10*3/uL (ref 0.0–0.1)
Basophils Relative: 0 %
Eosinophils Absolute: 0.1 10*3/uL (ref 0.0–0.5)
Eosinophils Relative: 1 %
HCT: 43.7 % (ref 36.0–46.0)
Hemoglobin: 14.5 g/dL (ref 12.0–15.0)
Immature Granulocytes: 0 %
Lymphocytes Relative: 35 %
Lymphs Abs: 2.4 10*3/uL (ref 0.7–4.0)
MCH: 28.1 pg (ref 26.0–34.0)
MCHC: 33.2 g/dL (ref 30.0–36.0)
MCV: 84.7 fL (ref 80.0–100.0)
Monocytes Absolute: 0.4 10*3/uL (ref 0.1–1.0)
Monocytes Relative: 5 %
Neutro Abs: 4 10*3/uL (ref 1.7–7.7)
Neutrophils Relative %: 59 %
Platelets: 202 10*3/uL (ref 150–400)
RBC: 5.16 MIL/uL — ABNORMAL HIGH (ref 3.87–5.11)
RDW: 13.3 % (ref 11.5–15.5)
WBC: 6.9 10*3/uL (ref 4.0–10.5)
nRBC: 0 % (ref 0.0–0.2)

## 2022-05-08 MED ORDER — ANASTROZOLE 1 MG PO TABS: 1.0000 mg | ORAL_TABLET | Freq: Every day | ORAL | 1 refills | Status: DC

## 2022-05-08 NOTE — Assessment & Plan Note (Signed)
05/18/2021 bone density showed left femoral neck T- 2.6. Discussed about bisphosphonate and patient declined.  Recommend patient to take calcium and vitamin D supplementation

## 2022-05-08 NOTE — Assessment & Plan Note (Signed)
Left breast high-grade DCIS, ER positive, status post lumpectomy and optional sentinel lymph node biopsy and re-excision of margin.  pTis pN0.  Labs reviewed and discussed with patient. Continue Arimidex 1 mg daily, plan 5 years-Jan 2027 .  Refills were sent to pharmacy. Annual Mammogram surveillance.

## 2022-05-08 NOTE — Progress Notes (Signed)
Patient here for follow up. Pt requesting refill on anastrazole

## 2022-05-08 NOTE — Progress Notes (Signed)
Hematology/Oncology Progress note [phone] [phone]      Patient Care Team: Sherron Monday, MD as PCP - General (Internal Medicine) [name], MD as Consulting Physician (Oncology) Carolan Shiver, MD as Consulting Physician (General Surgery) Carmina Miller, MD as Referring Physician (Radiation Oncology) Scarlett Presto, RN (Inactive) as Oncology Nurse Navigator (Oncology)  ASSESSMENT & PLAN:   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  pTis pN0.  Labs reviewed and discussed with patient. Continue Arimidex 1 mg daily, plan 5 years-Jan 2027 .  Refills were sent to pharmacy. Annual Mammogram surveillance.   Osteoporosis 05/18/2021 bone density showed left femoral neck T- 2.6. Discussed about bisphosphonate and patient declined.  Recommend patient to take calcium and vitamin D supplementation  Orders Placed This Encounter  Procedures   Comprehensive metabolic panel    Standing Status:   Future    Standing Expiration Date:   05/09/2023   CBC with Differential/Platelet    Standing Status:   Future    Standing Expiration Date:   05/09/2023   Follow up in 6 months.  Patient has annual diagnostic bilateral mammogram scheduled. All questions were answered. The patient knows to call the clinic with any problems, questions or concerns.  [name], MD, PhD [facility] Hematology Oncology 05/08/2022   CHIEF COMPLAINTS/REASON FOR VISIT:  Follow up for breast cancer DCIS  HISTORY OF PRESENTING ILLNESS:  Lindsay Guzman is a  56 y.o.  female with PMH listed below who presents for follow-up of left breast DCIS. Oncology history listed as below. Oncology History  Ductal carcinoma in situ (DCIS) of left breast  06/09/2020 Imaging   06/09/2020 bilateral diagnostic mammogram showed Indeterminate medial left breast  calcifications spanning 2.3 cm.  No other suspicious findings.  Medial right breast mass has been stable since 2012.   06/22/2020 Initial Diagnosis   Ductal carcinoma in situ (DCIS) of left breast  -06/17/2020 patient underwent stereotactic biopsy of left breast calcifications.  Biopsy showed high-grade DCIS with comedonecrosis.  Associated with calcification. -07/27/2020, patient proceed with left breast lumpectomy- Dr.Cintron,  Due to the high-grade DCIS, patient opted to do sentinel lymph node biopsy at the same time of lumpectomy surgery.Pathology showed left breast high-grade DCIS, with comedonecrosis, negative for invasive component.  No lymph node involvement. Closest DCIS margin was 0.5 mm.  ER positive 08/12/20 re-excision of inferior margin.pathology showed negative for residual DCIS, or invasive carcinoma.      09/21/2020 Genetic Testing   [facility] [employer]+RNA cancer panel found no pathogenic mutations.    10/21/2020 -  Radiation Therapy   Adjuvant Radiation   10/2020 -  Anti-estrogen oral therapy   Started on Arimidex    INTERVAL HISTORY Lindsay Guzman is a 56 y.o. female who has above history reviewed by me today presents for follow up visit for management of left breast high-grade DCIS Patient tolerates Arimidex 1 mg daily.  Manageable side effect  Review of Systems  Constitutional:  Negative for appetite change, chills, fatigue and fever.  HENT:   Negative for hearing loss and voice change.   Eyes:  Negative for eye problems.  Respiratory:  Negative for chest tightness, cough and shortness of breath.   Cardiovascular:  Negative for chest pain.  Gastrointestinal:  Negative for abdominal distention, abdominal pain and blood in stool.  Endocrine: Negative for hot flashes.  Genitourinary:  Negative for difficulty  urinating and frequency.   Musculoskeletal:  Positive for arthralgias.  Skin:  Negative for itching and rash.  Neurological:  Negative for extremity weakness.   Hematological:  Negative for adenopathy.  Psychiatric/Behavioral:  Negative for confusion.      MEDICAL HISTORY:  Past Medical History:  Diagnosis Date   Arthritis    Family history of breast cancer    Family history of lung cancer    History of kidney stones    History of stroke 04/18/2020   Hypercholesteremia    Hypertension    Personal history of radiation therapy    Stroke [facility])     SURGICAL HISTORY: Past Surgical History:  Procedure Laterality Date   ABDOMINAL HYSTERECTOMY     BREAST BIOPSY Left ?   neg   BREAST BIOPSY Left 06/17/2020   stereo bx, x-clip, positive   BREAST LUMPECTOMY Left 07/25/2020   CHOLECYSTECTOMY     MASTECTOMY, PARTIAL Left 08/12/2020   Procedure: RE-EXCISION OF INFERIOR MARGIN OF LEFT BREAST, DRAINAGE OF SEROMA OF LEFT BREAST;  Surgeon: Carolan Shiver, MD;  Location: [facility] ORS;  Service: General;  Laterality: Left;    SOCIAL HISTORY: Social History   Socioeconomic History   Marital status: Married    Spouse name: Not on file   Number of children: Not on file   Years of education: Not on file   Highest education level: Not on file  Occupational History   Not on file  Tobacco Use   Smoking status: Former    Types: Cigarettes    Quit date: 04/18/2020    Years since quitting: 2.0   Smokeless tobacco: Never  Substance and Sexual Activity   Alcohol use: Yes    Comment: occasionally    Drug use: Yes    Types: Marijuana   Sexual activity: Not on file  Other Topics Concern   Not on file  Social History Narrative   Not on file   Social Determinants of Health   Financial Resource Strain: Not on file  Food Insecurity: Not on file  Transportation Needs: Not on file  Physical Activity: Not on file  Stress: Not on file  Social Connections: Not on file  Intimate Partner Violence: Not on file    FAMILY HISTORY: Family History  Problem Relation Age of Onset   Breast cancer Maternal Aunt    Breast cancer Maternal Aunt     Breast cancer Maternal Aunt    Lung cancer Maternal Uncle    Cancer Paternal Aunt        unk type   Lung cancer Maternal Aunt     ALLERGIES:  is allergic to other and penicillins.  MEDICATIONS:  Current Outpatient Medications  Medication Sig Dispense Refill   amLODipine (NORVASC) 10 MG tablet Take 10 mg by mouth daily.     aspirin EC 81 MG tablet Take 81 mg by mouth daily. Swallow whole.      calcium carbonate (OSCAL) 1500 (600 Ca) MG TABS tablet Take by mouth 2 (two) times daily with a meal.     cholecalciferol (VITAMIN D3) 25 MCG (1000 UNIT) tablet Take 1,000 Units by mouth daily.     hydrALAZINE (APRESOLINE) 100 MG tablet Take 100 mg by mouth 2 (two) times daily as needed.     Multiple Vitamins-Minerals (CENTRUM SILVER ADULT 50+ PO) Take 1 tablet by mouth daily.     Olmesartan-amLODIPine-HCTZ 40-10-25 MG TABS Take 1 tablet by mouth every morning.     rosuvastatin (CRESTOR) 40 MG tablet Take  1 tablet by mouth daily.     anastrozole (ARIMIDEX) 1 MG tablet Take 1 tablet (1 mg total) by mouth daily. 90 tablet 1   No current facility-administered medications for this visit.     PHYSICAL EXAMINATION: ECOG PERFORMANCE STATUS: 0 - Asymptomatic Vitals:   05/08/22 1012  BP: 122/87  Pulse: 86  Resp: 18  Temp: (!) 96.8 F (36 C)   Filed Weights   05/08/22 1012  Weight: 183 lb 4.8 oz (83.1 kg)    Physical Exam Constitutional:      General: She is not in acute distress.    Appearance: She is not diaphoretic.  HENT:     Head: Normocephalic and atraumatic.     Nose: Nose normal.     Mouth/Throat:     Pharynx: No oropharyngeal exudate.  Eyes:     General: No scleral icterus.    Pupils: Pupils are equal, round, and reactive to light.  Cardiovascular:     Rate and Rhythm: Normal rate and regular rhythm.     Heart sounds: No murmur heard. Pulmonary:     Effort: Pulmonary effort is normal. No respiratory distress.     Breath sounds: No rales.  Chest:     Chest wall: No  tenderness.  Abdominal:     General: There is no distension.     Palpations: Abdomen is soft.     Tenderness: There is no abdominal tenderness.  Musculoskeletal:        General: Normal range of motion.     Cervical back: Normal range of motion and neck supple.  Skin:    General: Skin is warm and dry.     Findings: No erythema.  Neurological:     Mental Status: She is alert and oriented to person, place, and time.     Cranial Nerves: No cranial nerve deficit.     Motor: No abnormal muscle tone.     Coordination: Coordination normal.  Psychiatric:        Mood and Affect: Affect normal.   Breast exam was performed in seated and lying down position. Patient is status post left lumpectomy with a well-healed surgical scar.  Palpable round breast nodules at 3:00 and 5:00 of right breast-patient reports that these are chronic findings.    LABORATORY DATA:  I have reviewed the data as listed Lab Results  Component Value Date   WBC 6.9 05/08/2022   HGB 14.5 05/08/2022   HCT 43.7 05/08/2022   MCV 84.7 05/08/2022   PLT 202 05/08/2022   Recent Labs    11/08/21 0941 05/08/22 0955  NA 137 138  K 3.3* 3.1*  CL 98 103  CO2 29 27  GLUCOSE 99 95  BUN 14 11  CREATININE 0.80 0.83  CALCIUM 9.7 9.9  GFRNONAA >60 >60  PROT 8.2* 8.4*  ALBUMIN 4.4 4.4  AST 21 27  ALT 16 19  ALKPHOS 66 58  BILITOT 0.3 0.6    Iron/TIBC/Ferritin/ %Sat No results found for: "IRON", "TIBC", "FERRITIN", "IRONPCTSAT"

## 2022-06-13 ENCOUNTER — Ambulatory Visit
Admission: RE | Admit: 2022-06-13 | Discharge: 2022-06-13 | Disposition: A | Discharge status: Home / Self Care | Payer: BC Managed Care – PPO | Source: Ambulatory Visit | Attending: Oncology | Admitting: Oncology

## 2022-06-13 DIAGNOSIS — D0512 Intraductal carcinoma in situ of left breast: Secondary | ICD-10-CM | POA: Insufficient documentation

## 2022-10-04 ENCOUNTER — Ambulatory Visit: Payer: PRIVATE HEALTH INSURANCE | Admitting: Radiation Oncology

## 2022-11-08 ENCOUNTER — Inpatient Hospital Stay (HOSPITAL_BASED_OUTPATIENT_CLINIC_OR_DEPARTMENT_OTHER): Payer: BC Managed Care – PPO | Admitting: Oncology

## 2022-11-08 ENCOUNTER — Encounter: Payer: Self-pay | Admitting: Oncology

## 2022-11-08 ENCOUNTER — Ambulatory Visit
Admission: RE | Admit: 2022-11-08 | Discharge: 2022-11-08 | Disposition: A | Discharge status: Home / Self Care | Payer: BC Managed Care – PPO | Source: Ambulatory Visit | Attending: Radiation Oncology | Admitting: Radiation Oncology

## 2022-11-08 ENCOUNTER — Inpatient Hospital Stay: Payer: BC Managed Care – PPO | Attending: Oncology

## 2022-11-08 VITALS — BP 124/83 | HR 71 | Temp 97.0°F | Resp 18 | Wt 185.7 lb

## 2022-11-08 DIAGNOSIS — E876 Hypokalemia: Secondary | ICD-10-CM | POA: Diagnosis not present

## 2022-11-08 DIAGNOSIS — Z79811 Long term (current) use of aromatase inhibitors: Secondary | ICD-10-CM | POA: Insufficient documentation

## 2022-11-08 DIAGNOSIS — D0512 Intraductal carcinoma in situ of left breast: Secondary | ICD-10-CM

## 2022-11-08 DIAGNOSIS — Z87891 Personal history of nicotine dependence: Secondary | ICD-10-CM | POA: Diagnosis not present

## 2022-11-08 DIAGNOSIS — M2550 Pain in unspecified joint: Secondary | ICD-10-CM | POA: Insufficient documentation

## 2022-11-08 DIAGNOSIS — Z880 Allergy status to penicillin: Secondary | ICD-10-CM | POA: Insufficient documentation

## 2022-11-08 DIAGNOSIS — M810 Age-related osteoporosis without current pathological fracture: Secondary | ICD-10-CM | POA: Insufficient documentation

## 2022-11-08 DIAGNOSIS — Z801 Family history of malignant neoplasm of trachea, bronchus and lung: Secondary | ICD-10-CM | POA: Diagnosis not present

## 2022-11-08 DIAGNOSIS — Z923 Personal history of irradiation: Secondary | ICD-10-CM | POA: Insufficient documentation

## 2022-11-08 DIAGNOSIS — Z8673 Personal history of transient ischemic attack (TIA), and cerebral infarction without residual deficits: Secondary | ICD-10-CM | POA: Diagnosis not present

## 2022-11-08 DIAGNOSIS — Z9049 Acquired absence of other specified parts of digestive tract: Secondary | ICD-10-CM | POA: Insufficient documentation

## 2022-11-08 DIAGNOSIS — Z87442 Personal history of urinary calculi: Secondary | ICD-10-CM | POA: Diagnosis not present

## 2022-11-08 DIAGNOSIS — Z90710 Acquired absence of both cervix and uterus: Secondary | ICD-10-CM | POA: Insufficient documentation

## 2022-11-08 DIAGNOSIS — Z803 Family history of malignant neoplasm of breast: Secondary | ICD-10-CM | POA: Diagnosis not present

## 2022-11-08 DIAGNOSIS — Z79899 Other long term (current) drug therapy: Secondary | ICD-10-CM | POA: Insufficient documentation

## 2022-11-08 DIAGNOSIS — Z170 Estrogen receptor positive status [ER+]: Secondary | ICD-10-CM | POA: Insufficient documentation

## 2022-11-08 LAB — COMPREHENSIVE METABOLIC PANEL
ALT: 17 U/L (ref 0–44)
AST: 26 U/L (ref 15–41)
Albumin: 4.4 g/dL (ref 3.5–5.0)
Alkaline Phosphatase: 61 U/L (ref 38–126)
Anion gap: 12 (ref 5–15)
BUN: 12 mg/dL (ref 6–20)
CO2: 28 mmol/L (ref 22–32)
Calcium: 9.5 mg/dL (ref 8.9–10.3)
Chloride: 97 mmol/L — ABNORMAL LOW (ref 98–111)
Creatinine, Ser: 0.93 mg/dL (ref 0.44–1.00)
GFR, Estimated: >60 mL/min (ref >60)
Glucose, Bld: 92 mg/dL (ref 70–99)
Potassium: 3.2 mmol/L — ABNORMAL LOW (ref 3.5–5.1)
Sodium: 137 mmol/L (ref 135–145)
Total Bilirubin: 0.4 mg/dL (ref 0.3–1.2)
Total Protein: 8.4 g/dL — ABNORMAL HIGH (ref 6.5–8.1)

## 2022-11-08 LAB — CBC WITH DIFFERENTIAL/PLATELET
Abs Immature Granulocytes: 0.03 10*3/uL (ref 0.00–0.07)
Basophils Absolute: 0.1 10*3/uL (ref 0.0–0.1)
Basophils Relative: 1 %
Eosinophils Absolute: 0.3 10*3/uL (ref 0.0–0.5)
Eosinophils Relative: 4 %
HCT: 43.4 % (ref 36.0–46.0)
Hemoglobin: 13.7 g/dL (ref 12.0–15.0)
Immature Granulocytes: 0 %
Lymphocytes Relative: 33 %
Lymphs Abs: 2.8 10*3/uL (ref 0.7–4.0)
MCH: 28 pg (ref 26.0–34.0)
MCHC: 31.6 g/dL (ref 30.0–36.0)
MCV: 88.8 fL (ref 80.0–100.0)
Monocytes Absolute: 0.6 10*3/uL (ref 0.1–1.0)
Monocytes Relative: 7 %
Neutro Abs: 4.9 10*3/uL (ref 1.7–7.7)
Neutrophils Relative %: 55 %
Platelets: 235 10*3/uL (ref 150–400)
RBC: 4.89 MIL/uL (ref 3.87–5.11)
RDW: 13.4 % (ref 11.5–15.5)
Smear Review: NORMAL
WBC: 8.7 10*3/uL (ref 4.0–10.5)
nRBC: 0 % (ref 0.0–0.2)

## 2022-11-08 MED ORDER — ANASTROZOLE 1 MG PO TABS: 1.0000 mg | ORAL_TABLET | Freq: Every day | ORAL | 1 refills | Status: DC

## 2022-11-08 NOTE — Assessment & Plan Note (Addendum)
Left breast high-grade DCIS, ER positive, status post lumpectomy and optional sentinel lymph node biopsy and re-excision of margin.  pTis pN0.  Labs reviewed and discussed with patient. Continue Arimidex 1 mg daily, plan 5 years-Jan 2027 .  Refills were sent to pharmacy. Annual Mammogram surveillance - Aug 2024

## 2022-11-08 NOTE — Progress Notes (Signed)
Radiation Oncology Follow up Note  Name: Lindsay Guzman   Date:   11/08/2022 MRN:  [id number] DOB: [birth year]    This 57 y.o. female presents to the clinic today for 2-year follow-up status post whole breast radiation to her left breast for ER positive ductal carcinoma in situ.  REFERRING PROVIDER: Jodi Marble, MD  HPI: Patient is a 57 year old female now 2 years having completed whole breast radiation to her left breast for ER positive ductal carcinoma in situ.  Seen today in routine follow-up she is doing well.  She specifically denies breast tenderness cough or bone pain..  She mammograms back in August which I have reviewed were BI-RADS 2 benign.  She is currently on Arimidex tolerating it well without side effect  COMPLICATIONS OF TREATMENT: none  FOLLOW UP COMPLIANCE: keeps appointments   PHYSICAL EXAM:  There were no vitals taken for this visit. Lungs are clear to A&P cardiac examination essentially unremarkable with regular rate and rhythm. No dominant mass or nodularity is noted in either breast in 2 positions examined. Incision is well-healed. No axillary or supraclavicular adenopathy is appreciated. Cosmetic result is good.  She does have some tethering around the scar site in the upper inner quadrant of the breast.  Well-developed well-nourished patient in NAD. HEENT reveals PERLA, EOMI, discs not visualized.  Oral cavity is clear. No oral mucosal lesions are identified. Neck is clear without evidence of cervical or supraclavicular adenopathy. Lungs are clear to A&P. Cardiac examination is essentially unremarkable with regular rate and rhythm without murmur rub or thrill. Abdomen is benign with no organomegaly or masses noted. Motor sensory and DTR levels are equal and symmetric in the upper and lower extremities. Cranial nerves II through XII are grossly intact. Proprioception is intact. No peripheral adenopathy or edema is identified. No motor or sensory levels are noted. Crude  visual fields are within normal range.  RADIOLOGY RESULTS: Mammograms reviewed compatible with above-stated findings  PLAN: Present time patient is doing well 2 years out with no evidence of disease.  I have asked to see her back in 1 year for follow-up and then will discontinue follow-up care.  Patient knows to call with any concerns.  I would like to take this opportunity to thank you for allowing me to participate in the care of your patient.Noreene Filbert, MD

## 2022-11-08 NOTE — Progress Notes (Signed)
Hematology/Oncology Progress note [phone] [phone]      Patient Care Team: Jodi Marble, MD as PCP - General (Internal Medicine) Earlie Server, MD as Consulting Physician (Oncology) Herbert Pun, MD as Consulting Physician (General Surgery) Noreene Filbert, MD as Referring Physician (Radiation Oncology) Theodore Demark, RN (Inactive) as Oncology Nurse Navigator (Oncology)  ASSESSMENT & PLAN:   Cancer Staging  Ductal carcinoma in situ (DCIS) of left breast Staging form: Breast, AJCC 8th Edition - Clinical stage from 07/27/2020: Stage 0 (cTis (DCIS), cN0, cM0, G3, ER+, PR: Not Assessed, HER2: Not Assessed) - Signed by Earlie Server, MD on 11/08/2022   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  pTis pN0.  Labs reviewed and discussed with patient. Continue Arimidex 1 mg daily, plan 5 years-Jan 2027 .  Refills were sent to pharmacy. Annual Mammogram surveillance - Aug 2024  Hypokalemia Chronic. She will discuss with her PCP for potassium supplementation.   Osteoporosis 05/18/2021 bone density showed left femoral neck T- 2.6.  Discussed about bisphosphonate and patient declined. She also is not interested in switching to Tamoxifen Recommend patient to take calcium and vitamin D supplementation   Orders Placed This Encounter  Procedures   MM 3D SCREEN BREAST BILATERAL    Standing Status:   Future    Standing Expiration Date:   11/09/2023    Order Specific Question:   Reason for Exam (SYMPTOM  OR DIAGNOSIS REQUIRED)    Answer:   Breast cancer    Order Specific Question:   Is the patient pregnant?    Answer:   No    Order Specific Question:   Preferred imaging location?    Answer:   [facility]   CBC with Differential/Platelet    Standing Status:   Future    Standing Expiration Date:   11/08/2023    Comprehensive metabolic panel    Standing Status:   Future    Standing Expiration Date:   11/08/2023   Follow up in 6 months.  . All questions were answered. The patient knows to call the clinic with any problems, questions or concerns.  Earlie Server, MD, PhD [facility] Hematology Oncology 11/08/2022   CHIEF COMPLAINTS/REASON FOR VISIT:  Follow up for breast cancer DCIS  HISTORY OF PRESENTING ILLNESS:  Lindsay Guzman is a  57 y.o.  female with PMH listed below who presents for follow-up of left breast DCIS. Oncology history listed as below. Oncology History  Ductal carcinoma in situ (DCIS) of left breast  06/09/2020 Imaging   06/09/2020 bilateral diagnostic mammogram showed Indeterminate medial left breast calcifications spanning 2.3 cm.  No other suspicious findings.  Medial right breast mass has been stable since 2012.   06/22/2020 Initial Diagnosis   Ductal carcinoma in situ (DCIS) of left breast  -06/17/2020 patient underwent stereotactic biopsy of left breast calcifications.  Biopsy showed high-grade DCIS with comedonecrosis.  Associated with calcification. -07/27/2020, patient proceed with left breast lumpectomy- Dr.Cintron,  Due to the high-grade DCIS, patient opted to do sentinel lymph node biopsy at the same time of lumpectomy surgery.Pathology showed left breast high-grade DCIS, with comedonecrosis, negative for invasive component.  No lymph node involvement. Closest DCIS margin was 0.5 mm.  ER positive 08/12/20 re-excision of inferior margin.pathology showed negative for residual DCIS, or invasive carcinoma.      09/21/2020 Genetic Testing   [facility] [employer]+RNA cancer panel  found no pathogenic mutations.    10/21/2020 -  Radiation Therapy   Adjuvant Radiation   10/2020 -  Anti-estrogen oral therapy   Started on Arimidex   06/12/2021 Mammogram   Bilateral diagnostic mammogram showed Interval postoperative changes in the lower inner left breast at posterior depth. No mammographic  findings of malignancy in either breast.    INTERVAL HISTORY Lindsay Guzman is a 57 y.o. female who has above history reviewed by me today presents for follow up visit for management of left breast high-grade DCIS Patient tolerates Arimidex 1 mg daily.  Manageable side effect. She has no new breast concern.   Review of Systems  Constitutional:  Negative for appetite change, chills, fatigue and fever.  HENT:   Negative for hearing loss and voice change.   Eyes:  Negative for eye problems.  Respiratory:  Negative for chest tightness, cough and shortness of breath.   Cardiovascular:  Negative for chest pain.  Gastrointestinal:  Negative for abdominal distention, abdominal pain and blood in stool.  Endocrine: Negative for hot flashes.  Genitourinary:  Negative for difficulty urinating and frequency.   Musculoskeletal:  Positive for arthralgias.  Skin:  Negative for itching and rash.  Neurological:  Negative for extremity weakness.  Hematological:  Negative for adenopathy.  Psychiatric/Behavioral:  Negative for confusion.      MEDICAL HISTORY:  Past Medical History:  Diagnosis Date   Arthritis    Family history of breast cancer    Family history of lung cancer    History of kidney stones    History of stroke 04/18/2020   Hypercholesteremia    Hypertension    Personal history of radiation therapy    Stroke [facility])     SURGICAL HISTORY: Past Surgical History:  Procedure Laterality Date   ABDOMINAL HYSTERECTOMY     BREAST BIOPSY Left ?   neg   BREAST BIOPSY Left 06/17/2020   stereo bx, x-clip, positive   BREAST LUMPECTOMY Left 07/25/2020   CHOLECYSTECTOMY     MASTECTOMY, PARTIAL Left 08/12/2020   Procedure: RE-EXCISION OF INFERIOR MARGIN OF LEFT BREAST, DRAINAGE OF SEROMA OF LEFT BREAST;  Surgeon: Herbert Pun, MD;  Location: [facility] ORS;  Service: General;  Laterality: Left;    SOCIAL HISTORY: Social History   Socioeconomic History   Marital status: Married     Spouse name: Not on file   Number of children: Not on file   Years of education: Not on file   Highest education level: Not on file  Occupational History   Not on file  Tobacco Use   Smoking status: Former    Types: Cigarettes    Quit date: 04/18/2020    Years since quitting: 2.5   Smokeless tobacco: Never  Substance and Sexual Activity   Alcohol use: Yes    Comment: occasionally    Drug use: Yes    Types: Marijuana   Sexual activity: Not on file  Other Topics Concern   Not on file  Social History Narrative   Not on file   Social Determinants of Health   Financial Resource Strain: Not on file  Food Insecurity: Not on file  Transportation Needs: Not on file  Physical Activity: Not on file  Stress: Not on file  Social Connections: Not on file  Intimate Partner Violence: Not on file    FAMILY HISTORY: Family History  Problem Relation Age of Onset   Breast cancer Maternal Aunt    Breast cancer Maternal Aunt    Breast  cancer Maternal Aunt    Lung cancer Maternal Uncle    Cancer Paternal Aunt        unk type   Lung cancer Maternal Aunt     ALLERGIES:  is allergic to other and penicillins.  MEDICATIONS:  Current Outpatient Medications  Medication Sig Dispense Refill   amLODipine (NORVASC) 10 MG tablet Take 10 mg by mouth daily.     aspirin EC 81 MG tablet Take 81 mg by mouth daily. Swallow whole.      calcium carbonate (OSCAL) 1500 (600 Ca) MG TABS tablet Take by mouth 2 (two) times daily with a meal.     cholecalciferol (VITAMIN D3) 25 MCG (1000 UNIT) tablet Take 1,000 Units by mouth daily.     hydrALAZINE (APRESOLINE) 100 MG tablet Take 100 mg by mouth 2 (two) times daily as needed.     Multiple Vitamins-Minerals (CENTRUM SILVER ADULT 50+ PO) Take 1 tablet by mouth daily.     Olmesartan-amLODIPine-HCTZ 40-10-25 MG TABS Take 1 tablet by mouth every morning.     rosuvastatin (CRESTOR) 40 MG tablet Take 1 tablet by mouth daily.     anastrozole (ARIMIDEX) 1 MG tablet  Take 1 tablet (1 mg total) by mouth daily. 90 tablet 1   No current facility-administered medications for this visit.     PHYSICAL EXAMINATION: ECOG PERFORMANCE STATUS: 0 - Asymptomatic Vitals:   11/08/22 1330  BP: 124/83  Pulse: 71  Resp: 18  Temp: (!) 97 F (36.1 C)   Filed Weights   11/08/22 1330  Weight: 185 lb 11.2 oz (84.2 kg)    Physical Exam Constitutional:      General: She is not in acute distress.    Appearance: She is not diaphoretic.  HENT:     Head: Normocephalic and atraumatic.     Nose: Nose normal.     Mouth/Throat:     Pharynx: No oropharyngeal exudate.  Eyes:     General: No scleral icterus.    Pupils: Pupils are equal, round, and reactive to light.  Cardiovascular:     Rate and Rhythm: Normal rate and regular rhythm.     Heart sounds: No murmur heard. Pulmonary:     Effort: Pulmonary effort is normal. No respiratory distress.     Breath sounds: No rales.  Chest:     Chest wall: No tenderness.  Abdominal:     General: There is no distension.     Palpations: Abdomen is soft.     Tenderness: There is no abdominal tenderness.  Musculoskeletal:        General: Normal range of motion.     Cervical back: Normal range of motion and neck supple.  Skin:    General: Skin is warm and dry.     Findings: No erythema.  Neurological:     Mental Status: She is alert and oriented to person, place, and time.     Cranial Nerves: No cranial nerve deficit.     Motor: No abnormal muscle tone.     Coordination: Coordination normal.  Psychiatric:        Mood and Affect: Affect normal.       LABORATORY DATA:  I have reviewed the data as listed    Latest Ref Rng & Units 11/08/2022   12:33 PM 05/08/2022    9:55 AM 11/08/2021    9:41 AM  CBC  WBC 4.0 - 10.5 K/uL 8.7  6.9  8.0   Hemoglobin 12.0 - 15.0 g/dL 13.7  14.5  13.2   Hematocrit 36.0 - 46.0 % 43.4  43.7  39.3   Platelets 150 - 400 K/uL 235  202  240       Latest Ref Rng & Units 11/08/2022   12:33  PM 05/08/2022    9:55 AM 11/08/2021    9:41 AM  CMP  Glucose 70 - 99 mg/dL 92  95  99   BUN 6 - 20 mg/dL 12  11  14    Creatinine 0.44 - 1.00 mg/dL  9.16  3.84   Sodium 135 - 145 mmol/L 137  138  137   Potassium 3.5 - 5.1 mmol/L 3.2  3.1  3.3   Chloride 98 - 111 mmol/L 97  103  98   CO2 22 - 32 mmol/L 28  27  29    Calcium 8.9 - 10.3 mg/dL 9.5  9.9  9.7   Total Protein 6.5 - 8.1 g/dL 8.4  8.4  8.2   Total Bilirubin 0.3 - 1.2 mg/dL 0.4  0.6  0.3   Alkaline Phos 38 - 126 U/L 61  58  66   AST 15 - 41 U/L 26  27  21    ALT 0 - 44 U/L 17  19  16

## 2022-11-08 NOTE — Progress Notes (Signed)
Pt here for follow up. No new concerns voiced.  No new breast concerns   

## 2022-11-08 NOTE — Assessment & Plan Note (Signed)
Chronic. She will discuss with her PCP for potassium supplementation.

## 2022-11-08 NOTE — Assessment & Plan Note (Addendum)
05/18/2021 bone density showed left femoral neck T- 2.6.  Discussed about bisphosphonate and patient declined. She also is not interested in switching to Tamoxifen Recommend patient to take calcium and vitamin D supplementation

## 2022-11-20 ENCOUNTER — Other Ambulatory Visit: Payer: BC Managed Care – PPO

## 2022-11-20 ENCOUNTER — Other Ambulatory Visit: Payer: Self-pay | Admitting: Internal Medicine

## 2022-11-21 LAB — HEPATIC FUNCTION PANEL
ALT: 13 IU/L (ref 0–32)
AST: 19 IU/L (ref 0–40)
Albumin: 4.4 g/dL (ref 3.8–4.9)
Alkaline Phosphatase: 73 IU/L (ref 44–121)
Bilirubin Total: 0.2 mg/dL (ref 0.0–1.2)
Bilirubin, Direct: <0.1 mg/dL (ref 0.00–0.40)
Total Protein: 7 g/dL (ref 6.0–8.5)

## 2022-11-21 LAB — LIPID PANEL W/O CHOL/HDL RATIO
Cholesterol, Total: 118 mg/dL (ref 100–199)
HDL: 40 mg/dL (ref >39)
LDL Chol Calc (NIH): 63 mg/dL (ref 0–99)
Triglycerides: 73 mg/dL (ref 0–149)
VLDL Cholesterol Cal: 15 mg/dL (ref 5–40)

## 2022-11-21 LAB — CK: Total CK: 97 U/L (ref 32–182)

## 2022-11-23 ENCOUNTER — Ambulatory Visit: Payer: BC Managed Care – PPO | Admitting: Internal Medicine

## 2022-11-23 ENCOUNTER — Encounter: Payer: Self-pay | Admitting: Internal Medicine

## 2022-11-23 VITALS — BP 120/80 | HR 81 | Ht 64.0 in | Wt 182.4 lb

## 2022-11-23 DIAGNOSIS — E876 Hypokalemia: Secondary | ICD-10-CM | POA: Diagnosis not present

## 2022-11-23 DIAGNOSIS — I639 Cerebral infarction, unspecified: Secondary | ICD-10-CM | POA: Diagnosis not present

## 2022-11-23 DIAGNOSIS — I10 Essential (primary) hypertension: Secondary | ICD-10-CM | POA: Diagnosis not present

## 2022-11-23 MED ORDER — POTASSIUM CHLORIDE CRYS ER 20 MEQ PO TBCR: 20.0000 meq | EXTENDED_RELEASE_TABLET | Freq: Once | ORAL | Status: AC

## 2022-11-23 NOTE — Patient Instructions (Signed)

## 2022-11-23 NOTE — Progress Notes (Signed)
Established Patient Office Visit  Subjective:  Patient ID: Lindsay Guzman, female    DOB: 1966-09-22  Age: 57 y.o. MRN: [id number]  Chief Complaint  Patient presents with   Follow-up    3 mo lab    HPI No new complaints, here for lab review and medication refills.Labs reviewed and notable for  well controlled lipids. Of note she has a history of hypokalemia.  Past Medical History:  Diagnosis Date   Arthritis    Family history of breast cancer    Family history of lung cancer    History of kidney stones    History of stroke 04/18/2020   Hypercholesteremia    Hypertension    Personal history of radiation therapy    Stroke [facility])     Social History   Socioeconomic History   Marital status: Married    Spouse name: Not on file   Number of children: Not on file   Years of education: Not on file   Highest education level: Not on file  Occupational History   Not on file  Tobacco Use   Smoking status: Former    Types: Cigarettes    Quit date: 04/18/2020    Years since quitting: 2.6   Smokeless tobacco: Never  Substance and Sexual Activity   Alcohol use: Yes    Comment: occasionally    Drug use: Yes    Types: Marijuana   Sexual activity: Not on file  Other Topics Concern   Not on file  Social History Narrative   Not on file   Social Determinants of Health   Financial Resource Strain: Not on file  Food Insecurity: Not on file  Transportation Needs: Not on file  Physical Activity: Not on file  Stress: Not on file  Social Connections: Not on file  Intimate Partner Violence: Not on file    Family History  Problem Relation Age of Onset   Breast cancer Maternal Aunt    Breast cancer Maternal Aunt    Breast cancer Maternal Aunt    Lung cancer Maternal Uncle    Cancer Paternal Aunt        unk type   Lung cancer Maternal Aunt     Allergies  Allergen Reactions   Other Anaphylaxis and Swelling    Nuts    Penicillins Hives    ROS     Objective:   BP  120/80   Pulse 81   Ht '5\' 4"'$  (1.626 m)   Wt 182 lb 6.4 oz (82.7 kg)   SpO2 100%   BMI 31.31 kg/m   Vitals:   11/23/22 1159  BP: 120/80  Pulse: 81  Height: '5\' 4"'$  (1.626 m)  Weight: 182 lb 6.4 oz (82.7 kg)  SpO2: 100%  BMI (Calculated): 31.29    Physical Exam Vitals reviewed.  Constitutional:      General: She is not in acute distress. HENT:     Head: Normocephalic.     Nose: Nose normal.     Mouth/Throat:     Mouth: Mucous membranes are moist.  Eyes:     Extraocular Movements: Extraocular movements intact.     Pupils: Pupils are equal, round, and reactive to light.  Cardiovascular:     Rate and Rhythm: Normal rate and regular rhythm.     Pulses: Normal pulses.     Heart sounds: Murmur heard.     Crescendo decrescendo systolic murmur is present with a grade of 3/6.  Pulmonary:  Effort: Pulmonary effort is normal.     Breath sounds: No rhonchi or rales.  Abdominal:     General: Abdomen is flat.     Palpations: There is no hepatomegaly, splenomegaly or mass.  Musculoskeletal:        General: Normal range of motion.     Cervical back: Normal range of motion. No tenderness.  Skin:    General: Skin is warm and dry.  Neurological:     General: No focal deficit present.     Mental Status: She is alert and oriented to person, place, and time.     Cranial Nerves: No cranial nerve deficit.     Motor: No weakness.  Psychiatric:        Mood and Affect: Mood normal.        Behavior: Behavior normal.      No results found for any visits on 11/23/22.  Recent Results (from the past 2160 hour(Sudiksha Victor))  CBC with Differential/Platelet     Status: None   Collection Time: 11/08/22 12:33 PM  Result Value Ref Range   WBC 8.7 4.0 - 10.5 K/uL    Comment: WHITE COUNT CONFIRMED ON SMEAR   RBC 4.89 3.87 - 5.11 MIL/uL   Hemoglobin 13.7 12.0 - 15.0 g/dL   HCT 43.4 36.0 - 46.0 %   MCV 88.8 80.0 - 100.0 fL   MCH 28.0 26.0 - 34.0 pg   MCHC 31.6 30.0 - 36.0 g/dL   RDW 13.4 11.5 -  15.5 %   Platelets 235 150 - 400 K/uL   nRBC 0.0 0.0 - 0.2 %   Neutrophils Relative % 55 %   Neutro Abs 4.9 1.7 - 7.7 K/uL   Lymphocytes Relative 33 %   Lymphs Abs 2.8 0.7 - 4.0 K/uL   Monocytes Relative 7 %   Monocytes Absolute 0.6 0.1 - 1.0 K/uL   Eosinophils Relative 4 %   Eosinophils Absolute 0.3 0.0 - 0.5 K/uL   Basophils Relative 1 %   Basophils Absolute 0.1 0.0 - 0.1 K/uL   WBC Morphology DIFF. CONFIRMED BY SMEAR    RBC Morphology MORPHOLOGY UNREMARKABLE    Smear Review Normal platelet morphology     Comment: PLATELETS APPEAR ADEQUATE   Immature Granulocytes 0 %   Abs Immature Granulocytes 0.03 0.00 - 0.07 K/uL    Comment: Performed at [facility], [address] [place], [place] [address]  Comprehensive metabolic panel     Status: Abnormal   Collection Time: 11/08/22 12:33 PM  Result Value Ref Range   Sodium 137 135 - 145 mmol/L   Potassium 3.2 (L) 3.5 - 5.1 mmol/L   Chloride 97 (L) 98 - 111 mmol/L   CO2 28 22 - 32 mmol/L   Glucose, Bld 92 70 - 99 mg/dL    Comment: Glucose reference range applies only to samples taken after fasting for at least 8 hours.   BUN 12 6 - 20 mg/dL   Creatinine, Ser 0.93 0.44 - 1.00 mg/dL   Calcium 9.5 8.9 - 10.3 mg/dL   Total Protein 8.4 (H) 6.5 - 8.1 g/dL   Albumin 4.4 3.5 - 5.0 g/dL   AST 26 15 - 41 U/L   ALT 17 0 - 44 U/L   Alkaline Phosphatase 61 38 - 126 U/L   Total Bilirubin 0.4 0.3 - 1.2 mg/dL   GFR, Estimated >60 >60 mL/min    Comment: (NOTE) Calculated using the CKD-EPI Creatinine Equation (2021)    Anion gap 12  5 - 15    Comment: Performed at [facility], [address] [place], [place] [address]  Lipid Panel w/o Chol/HDL Ratio     Status: None   Collection Time: 11/20/22  8:56 AM  Result Value Ref Range   Cholesterol, Total 118 100 - 199 mg/dL   Triglycerides 73 0 - 149 mg/dL   HDL 40 >39 mg/dL   VLDL Cholesterol Cal 15 5 - 40 mg/dL   LDL Chol Calc (NIH) 63 0 - 99 mg/dL  Hepatic function panel      Status: None   Collection Time: 11/20/22  8:56 AM  Result Value Ref Range   Total Protein 7.0 6.0 - 8.5 g/dL   Albumin 4.4 3.8 - 4.9 g/dL   Bilirubin Total 0.2 0.0 - 1.2 mg/dL   Bilirubin, Direct <0.10 0.00 - 0.40 mg/dL   Alkaline Phosphatase 73 44 - 121 IU/L   AST 19 0 - 40 IU/L   ALT 13 0 - 32 IU/L  CK     Status: None   Collection Time: 11/20/22  8:56 AM  Result Value Ref Range   Total CK 97 32 - 182 U/L      Assessment & Plan:   Problem List Items Addressed This Visit   None 1. Hypokalemia  2. Essential hypertension  3. Acute ischemic stroke [facility])    '@VISPATINSTR'$ @  No follow-ups on file.   Total time spent: 20 minutes  Volanda Napoleon, MD  11/23/2022

## 2023-01-26 ENCOUNTER — Other Ambulatory Visit: Payer: Self-pay | Admitting: Internal Medicine

## 2023-01-26 DIAGNOSIS — I639 Cerebral infarction, unspecified: Secondary | ICD-10-CM

## 2023-01-26 DIAGNOSIS — I10 Essential (primary) hypertension: Secondary | ICD-10-CM

## 2023-02-27 ENCOUNTER — Ambulatory Visit: Payer: BC Managed Care – PPO | Admitting: Internal Medicine

## 2023-03-04 ENCOUNTER — Other Ambulatory Visit: Payer: Self-pay | Admitting: Internal Medicine

## 2023-03-04 ENCOUNTER — Other Ambulatory Visit: Payer: BC Managed Care – PPO

## 2023-03-05 LAB — COMPREHENSIVE METABOLIC PANEL
ALT: 14 IU/L (ref 0–32)
AST: 21 IU/L (ref 0–40)
Albumin/Globulin Ratio: 1.5 (ref 1.2–2.2)
Albumin: 4.5 g/dL (ref 3.8–4.9)
Alkaline Phosphatase: 77 IU/L (ref 44–121)
BUN/Creatinine Ratio: 15 (ref 9–23)
BUN: 15 mg/dL (ref 6–24)
Bilirubin Total: 0.4 mg/dL (ref 0.0–1.2)
CO2: 26 mmol/L (ref 20–29)
Calcium: 9.9 mg/dL (ref 8.7–10.2)
Chloride: 99 mmol/L (ref 96–106)
Creatinine, Ser: 1.01 mg/dL — ABNORMAL HIGH (ref 0.57–1.00)
Globulin, Total: 3 g/dL (ref 1.5–4.5)
Glucose: 83 mg/dL (ref 70–99)
Potassium: 3.8 mmol/L (ref 3.5–5.2)
Sodium: 140 mmol/L (ref 134–144)
Total Protein: 7.5 g/dL (ref 6.0–8.5)
eGFR: 65 mL/min/{1.73_m2} (ref >59)

## 2023-03-05 LAB — LIPID PANEL
Chol/HDL Ratio: 2.9 ratio (ref 0.0–4.4)
Cholesterol, Total: 121 mg/dL (ref 100–199)
HDL: 42 mg/dL (ref >39)
LDL Chol Calc (NIH): 64 mg/dL (ref 0–99)
Triglycerides: 74 mg/dL (ref 0–149)
VLDL Cholesterol Cal: 15 mg/dL (ref 5–40)

## 2023-03-05 LAB — CREATININE KINASE MB: CK-MB Index: 3.5 ng/mL (ref 0.0–5.3)

## 2023-03-08 ENCOUNTER — Ambulatory Visit: Payer: BC Managed Care – PPO | Admitting: Internal Medicine

## 2023-03-25 ENCOUNTER — Other Ambulatory Visit: Payer: BC Managed Care – PPO

## 2023-03-25 ENCOUNTER — Other Ambulatory Visit: Payer: Self-pay | Admitting: Internal Medicine

## 2023-03-25 DIAGNOSIS — I639 Cerebral infarction, unspecified: Secondary | ICD-10-CM

## 2023-03-26 LAB — CBC WITH DIFF/PLATELET
Basophils Absolute: 0.1 10*3/uL (ref 0.0–0.2)
Basos: 1 %
EOS (ABSOLUTE): 0.2 10*3/uL (ref 0.0–0.4)
Eos: 4 %
Hematocrit: 41.7 % (ref 34.0–46.6)
Hemoglobin: 13.7 g/dL (ref 11.1–15.9)
Immature Grans (Abs): 0 10*3/uL (ref 0.0–0.1)
Immature Granulocytes: 0 %
Lymphocytes Absolute: 2.1 10*3/uL (ref 0.7–3.1)
Lymphs: 34 %
MCH: 27.8 pg (ref 26.6–33.0)
MCHC: 32.9 g/dL (ref 31.5–35.7)
MCV: 85 fL (ref 79–97)
Monocytes Absolute: 0.4 10*3/uL (ref 0.1–0.9)
Monocytes: 7 %
Neutrophils Absolute: 3.5 10*3/uL (ref 1.4–7.0)
Neutrophils: 54 %
Platelets: 263 10*3/uL (ref 150–450)
RBC: 4.92 x10E6/uL (ref 3.77–5.28)
RDW: 13.1 % (ref 11.7–15.4)
WBC: 6.3 10*3/uL (ref 3.4–10.8)

## 2023-03-26 LAB — LIPID PANEL
Chol/HDL Ratio: 2.8 ratio (ref 0.0–4.4)
Cholesterol, Total: 125 mg/dL (ref 100–199)
HDL: 44 mg/dL (ref >39)
LDL Chol Calc (NIH): 66 mg/dL (ref 0–99)
Triglycerides: 71 mg/dL (ref 0–149)
VLDL Cholesterol Cal: 15 mg/dL (ref 5–40)

## 2023-03-26 LAB — COMPREHENSIVE METABOLIC PANEL
ALT: 16 IU/L (ref 0–32)
AST: 20 IU/L (ref 0–40)
Albumin/Globulin Ratio: 1.5
Albumin: 4.4 g/dL (ref 3.8–4.9)
Alkaline Phosphatase: 76 IU/L (ref 44–121)
BUN/Creatinine Ratio: 13 (ref 9–23)
BUN: 12 mg/dL (ref 6–24)
Bilirubin Total: 0.3 mg/dL (ref 0.0–1.2)
CO2: 26 mmol/L (ref 20–29)
Calcium: 9.6 mg/dL (ref 8.7–10.2)
Chloride: 100 mmol/L (ref 96–106)
Creatinine, Ser: 0.92 mg/dL (ref 0.57–1.00)
Globulin, Total: 2.9 g/dL (ref 1.5–4.5)
Glucose: 91 mg/dL (ref 70–99)
Potassium: 3.8 mmol/L (ref 3.5–5.2)
Sodium: 140 mmol/L (ref 134–144)
Total Protein: 7.3 g/dL (ref 6.0–8.5)
eGFR: 72 mL/min/{1.73_m2} (ref >59)

## 2023-03-29 ENCOUNTER — Ambulatory Visit: Payer: BC Managed Care – PPO | Admitting: Internal Medicine

## 2023-04-01 ENCOUNTER — Ambulatory Visit (INDEPENDENT_AMBULATORY_CARE_PROVIDER_SITE_OTHER): Payer: BC Managed Care – PPO | Admitting: Internal Medicine

## 2023-04-01 VITALS — BP 130/85 | HR 71 | Ht 64.0 in | Wt 183.4 lb

## 2023-04-01 DIAGNOSIS — I10 Essential (primary) hypertension: Secondary | ICD-10-CM | POA: Diagnosis not present

## 2023-04-01 DIAGNOSIS — I639 Cerebral infarction, unspecified: Secondary | ICD-10-CM

## 2023-04-01 NOTE — Progress Notes (Signed)
Established Patient Office Visit  Subjective:  Patient ID: Lindsay Guzman, female    DOB: [birth year]  Age: 57 y.o. MRN: [id number]  Chief Complaint  Patient presents with   Follow-up    3 Months Lab Results    No new complaints, here for lab review and medication refills. LDL and TC well controlled on lab review. Triglycerides also satisfactory. CMP notable for slightly elevated Cr with normal gfr to which she admits poor fluid intake.     No other concerns at this time.   Past Medical History:  Diagnosis Date   Arthritis    Family history of breast cancer    Family history of lung cancer    History of kidney stones    History of stroke 04/18/2020   Hypercholesteremia    Hypertension    Personal history of radiation therapy    Stroke [facility])     Past Surgical History:  Procedure Laterality Date   ABDOMINAL HYSTERECTOMY     BREAST BIOPSY Left ?   neg   BREAST BIOPSY Left 06/17/2020   stereo bx, x-clip, positive   BREAST LUMPECTOMY Left 07/25/2020   CHOLECYSTECTOMY     MASTECTOMY, PARTIAL Left 08/12/2020   Procedure: RE-EXCISION OF INFERIOR MARGIN OF LEFT BREAST, DRAINAGE OF SEROMA OF LEFT BREAST;  Surgeon: Carolan Shiver, MD;  Location: [facility] ORS;  Service: General;  Laterality: Left;    Social History   Socioeconomic History   Marital status: Married    Spouse name: Not on file   Number of children: Not on file   Years of education: Not on file   Highest education level: Not on file  Occupational History   Not on file  Tobacco Use   Smoking status: Former    Types: Cigarettes    Quit date: 04/18/2020    Years since quitting: 2.9   Smokeless tobacco: Never  Substance and Sexual Activity   Alcohol use: Yes    Comment: occasionally    Drug use: Yes    Types: Marijuana   Sexual activity: Not on file  Other Topics Concern   Not on file  Social History Narrative   Not on file   Social Determinants of Health   Financial Resource Strain: Not on  file  Food Insecurity: Not on file  Transportation Needs: Not on file  Physical Activity: Not on file  Stress: Not on file  Social Connections: Not on file  Intimate Partner Violence: Not on file    Family History  Problem Relation Age of Onset   Breast cancer Maternal Aunt    Breast cancer Maternal Aunt    Breast cancer Maternal Aunt    Lung cancer Maternal Uncle    Cancer Paternal Aunt        unk type   Lung cancer Maternal Aunt     Allergies  Allergen Reactions   Other Anaphylaxis and Swelling    Nuts    Penicillins Hives    Review of Systems  Constitutional: Negative.   HENT: Negative.    Eyes: Negative.   Respiratory: Negative.    Cardiovascular: Negative.   Gastrointestinal: Negative.   Genitourinary: Negative.   Musculoskeletal:  Positive for back pain and joint pain.  Skin: Negative.   Neurological: Negative.   Endo/Heme/Allergies: Negative.        Objective:   BP 130/85   Pulse 71   Ht 5\' 4"  (1.626 m)   Wt 183 lb 6.4 oz (83.2 kg)   SpO2  96%   BMI 31.48 kg/m   Vitals:   04/01/23 1409  BP: 130/85  Pulse: 71  Height: 5\' 4"  (1.626 m)  Weight: 183 lb 6.4 oz (83.2 kg)  SpO2: 96%  BMI (Calculated): 31.47    Physical Exam Vitals reviewed.  Constitutional:      General: She is not in acute distress. HENT:     Head: Normocephalic.     Nose: Nose normal.     Mouth/Throat:     Mouth: Mucous membranes are moist.  Eyes:     Extraocular Movements: Extraocular movements intact.     Pupils: Pupils are equal, round, and reactive to light.  Cardiovascular:     Rate and Rhythm: Normal rate and regular rhythm.     Pulses: Normal pulses.     Heart sounds: Murmur heard.     Crescendo decrescendo systolic murmur is present with a grade of 3/6.  Pulmonary:     Effort: Pulmonary effort is normal.     Breath sounds: No rhonchi or rales.  Abdominal:     General: Abdomen is flat.     Palpations: There is no hepatomegaly, splenomegaly or mass.   Musculoskeletal:        General: Normal range of motion.     Cervical back: Normal range of motion. No tenderness.  Skin:    General: Skin is warm and dry.  Neurological:     General: No focal deficit present.     Mental Status: She is alert and oriented to person, place, and time.     Cranial Nerves: No cranial nerve deficit.     Motor: No weakness.  Psychiatric:        Mood and Affect: Mood normal.        Behavior: Behavior normal.      No results found for any visits on 04/01/23.  Recent Results (from the past 2160 hour([place]))  Comprehensive metabolic panel     Status: Abnormal   Collection Time: 03/04/23  8:55 AM  Result Value Ref Range   Glucose 83 70 - 99 mg/dL   BUN 15 6 - 24 mg/dL   Creatinine, Ser 1.61 (H) 0.57 - 1.00 mg/dL   eGFR 65 >09 UE/AVW/0.98   BUN/Creatinine Ratio 15 9 - 23   Sodium 140 134 - 144 mmol/L   Potassium 3.8 3.5 - 5.2 mmol/L   Chloride 99 96 - 106 mmol/L   CO2 26 20 - 29 mmol/L   Calcium 9.9 8.7 - 10.2 mg/dL   Total Protein 7.5 6.0 - 8.5 g/dL   Albumin 4.5 3.8 - 4.9 g/dL   Globulin, Total 3.0 1.5 - 4.5 g/dL   Albumin/Globulin Ratio 1.5 1.2 - 2.2   Bilirubin Total 0.4 0.0 - 1.2 mg/dL   Alkaline Phosphatase 77 44 - 121 IU/L   AST 21 0 - 40 IU/L   ALT 14 0 - 32 IU/L  Lipid panel     Status: None   Collection Time: 03/04/23  8:55 AM  Result Value Ref Range   Cholesterol, Total 121 100 - 199 mg/dL   Triglycerides 74 0 - 149 mg/dL   HDL 42 >11 mg/dL   VLDL Cholesterol Cal 15 5 - 40 mg/dL   LDL Chol Calc (NIH) 64 0 - 99 mg/dL   Chol/HDL Ratio 2.9 0.0 - 4.4 ratio    Comment:  T. Chol/HDL Ratio                                             Men  Women                               1/2 Avg.Risk  3.4    3.3                                   Avg.Risk  5.0    4.4                                2X Avg.Risk  9.6    7.1                                3X Avg.Risk 23.4   11.0   CKMB     Status: None   Collection Time:  03/04/23  8:55 AM  Result Value Ref Range   CK-MB Index 3.5 0.0 - 5.3 ng/mL  Comprehensive metabolic panel     Status: None   Collection Time: 03/25/23  2:49 PM  Result Value Ref Range   Glucose 91 70 - 99 mg/dL   BUN 12 6 - 24 mg/dL   Creatinine, Ser 1.61 0.57 - 1.00 mg/dL   eGFR 72 >09 UE/AVW/0.98   BUN/Creatinine Ratio 13 9 - 23   Sodium 140 134 - 144 mmol/L   Potassium 3.8 3.5 - 5.2 mmol/L   Chloride 100 96 - 106 mmol/L   CO2 26 20 - 29 mmol/L   Calcium 9.6 8.7 - 10.2 mg/dL   Total Protein 7.3 6.0 - 8.5 g/dL   Albumin 4.4 3.8 - 4.9 g/dL   Globulin, Total 2.9 1.5 - 4.5 g/dL   Albumin/Globulin Ratio 1.5    Bilirubin Total 0.3 0.0 - 1.2 mg/dL   Alkaline Phosphatase 76 44 - 121 IU/L   AST 20 0 - 40 IU/L   ALT 16 0 - 32 IU/L  Lipid panel     Status: None   Collection Time: 03/25/23  2:49 PM  Result Value Ref Range   Cholesterol, Total 125 100 - 199 mg/dL   Triglycerides 71 0 - 149 mg/dL   HDL 44 >11 mg/dL   VLDL Cholesterol Cal 15 5 - 40 mg/dL   LDL Chol Calc (NIH) 66 0 - 99 mg/dL   LDL CALC COMMENT: CANCELED     Comment: Test not performed  Result canceled by the ancillary.    Chol/HDL Ratio 2.8 0.0 - 4.4 ratio    Comment:                                   T. Chol/HDL Ratio                                             Men  Women  1/2 Avg.Risk  3.4    3.3                                   Avg.Risk  5.0    4.4                                2X Avg.Risk  9.6    7.1                                3X Avg.Risk 23.4   11.0   CBC With Diff/Platelet     Status: None   Collection Time: 03/25/23  2:49 PM  Result Value Ref Range   WBC 6.3 3.4 - 10.8 x10E3/uL   RBC 4.92 3.77 - 5.28 x10E6/uL   Hemoglobin 13.7 11.1 - 15.9 g/dL   Hematocrit 16.1 09.6 - 46.6 %   MCV 85 79 - 97 fL   MCH 27.8 26.6 - 33.0 pg   MCHC 32.9 31.5 - 35.7 g/dL   RDW 04.5 40.9 - 81.1 %   Platelets 263 150 - 450 x10E3/uL   Neutrophils 54 Not Estab. %   Lymphs 34 Not Estab. %    Monocytes 7 Not Estab. %   Eos 4 Not Estab. %   Basos 1 Not Estab. %   Neutrophils Absolute 3.5 1.4 - 7.0 x10E3/uL   Lymphocytes Absolute 2.1 0.7 - 3.1 x10E3/uL   Monocytes Absolute 0.4 0.1 - 0.9 x10E3/uL   EOS (ABSOLUTE) 0.2 0.0 - 0.4 x10E3/uL   Basophils Absolute 0.1 0.0 - 0.2 x10E3/uL   Immature Granulocytes 0 Not Estab. %   Immature Grans (Abs) 0.0 0.0 - 0.1 x10E3/uL      Assessment & Plan:   As per problem list.  Problem List Items Addressed This Visit       Cardiovascular and Mediastinum   Essential hypertension (Chronic)   Acute ischemic stroke [facility] [place] [facility]) - Primary   Relevant Orders   Lipid panel   Hepatic function panel    Return in about 3 months (around 07/02/2023) for fu with labs prior.   Total time spent: 20 minutes  Luna Fuse, MD  04/01/2023   This document may have been prepared by [facility] Voice Recognition software and as such may include unintentional dictation errors.

## 2023-04-08 ENCOUNTER — Encounter: Payer: Self-pay | Admitting: *Deleted

## 2023-04-08 ENCOUNTER — Emergency Department
Admission: EM | Admit: 2023-04-08 | Discharge: 2023-04-08 | Disposition: A | Discharge status: Home / Self Care | Payer: BC Managed Care – PPO | Attending: Emergency Medicine | Admitting: Emergency Medicine

## 2023-04-08 ENCOUNTER — Other Ambulatory Visit: Payer: Self-pay

## 2023-04-08 DIAGNOSIS — R531 Weakness: Secondary | ICD-10-CM | POA: Diagnosis not present

## 2023-04-08 DIAGNOSIS — D72829 Elevated white blood cell count, unspecified: Secondary | ICD-10-CM | POA: Insufficient documentation

## 2023-04-08 DIAGNOSIS — I10 Essential (primary) hypertension: Secondary | ICD-10-CM | POA: Insufficient documentation

## 2023-04-08 DIAGNOSIS — R110 Nausea: Secondary | ICD-10-CM | POA: Insufficient documentation

## 2023-04-08 DIAGNOSIS — M545 Low back pain, unspecified: Secondary | ICD-10-CM | POA: Diagnosis not present

## 2023-04-08 LAB — CBC
HCT: 43.1 % (ref 36.0–46.0)
Hemoglobin: 14.2 g/dL (ref 12.0–15.0)
MCH: 27.8 pg (ref 26.0–34.0)
MCHC: 32.9 g/dL (ref 30.0–36.0)
MCV: 84.5 fL (ref 80.0–100.0)
Platelets: 219 10*3/uL (ref 150–400)
RBC: 5.1 MIL/uL (ref 3.87–5.11)
RDW: 12.9 % (ref 11.5–15.5)
WBC: 19.4 10*3/uL — ABNORMAL HIGH (ref 4.0–10.5)
nRBC: 0 % (ref 0.0–0.2)

## 2023-04-08 LAB — COMPREHENSIVE METABOLIC PANEL
ALT: 36 U/L (ref 0–44)
AST: 34 U/L (ref 15–41)
Albumin: 3.9 g/dL (ref 3.5–5.0)
Alkaline Phosphatase: 65 U/L (ref 38–126)
Anion gap: 11 (ref 5–15)
BUN: 24 mg/dL — ABNORMAL HIGH (ref 6–20)
CO2: 28 mmol/L (ref 22–32)
Calcium: 9.3 mg/dL (ref 8.9–10.3)
Chloride: 95 mmol/L — ABNORMAL LOW (ref 98–111)
Creatinine, Ser: 1.17 mg/dL — ABNORMAL HIGH (ref 0.44–1.00)
GFR, Estimated: 54 mL/min — ABNORMAL LOW (ref >60)
Glucose, Bld: 102 mg/dL — ABNORMAL HIGH (ref 70–99)
Potassium: 3 mmol/L — ABNORMAL LOW (ref 3.5–5.1)
Sodium: 134 mmol/L — ABNORMAL LOW (ref 135–145)
Total Bilirubin: 1.1 mg/dL (ref 0.3–1.2)
Total Protein: 7.5 g/dL (ref 6.5–8.1)

## 2023-04-08 LAB — URINALYSIS, COMPLETE (UACMP) WITH MICROSCOPIC
Bilirubin Urine: NEGATIVE
Glucose, UA: NEGATIVE mg/dL
Hgb urine dipstick: NEGATIVE
Ketones, ur: 5 mg/dL — AB
Nitrite: NEGATIVE
Protein, ur: NEGATIVE mg/dL
Specific Gravity, Urine: 1.006 (ref 1.005–1.030)
pH: 6 (ref 5.0–8.0)

## 2023-04-08 MED ORDER — ONDANSETRON HCL 4 MG/2ML IJ SOLN: 4.0000 mg | Freq: Once | INTRAMUSCULAR | Status: DC

## 2023-04-08 MED ORDER — ONDANSETRON 4 MG PO TBDP: 4.0000 mg | ORAL_TABLET | Freq: Three times a day (TID) | ORAL | 0 refills | Status: DC | PRN

## 2023-04-08 MED ORDER — SODIUM CHLORIDE 0.9 % IV BOLUS
1000.0000 mL | Freq: Once | INTRAVENOUS | Status: AC
  Administered 2023-04-08: 1000 mL via INTRAVENOUS

## 2023-04-08 NOTE — ED Triage Notes (Signed)
Pt reports feeling dehydrated since last week.  Pt denies n/vd at this time.  Pt reports right lower back pain.  Denies dysuria.  Pt alert  speech clear.

## 2023-04-08 NOTE — ED Notes (Signed)
Pt hard stick currently. Will obtain US for IV placement. Pt adamant blood was successfully taken and sent to lab during her triage. Will call lab to run blood and will send extra tubes as well.

## 2023-04-08 NOTE — ED Notes (Signed)
See triage note. R lower back pain since this morning; fatigue; states did have many episodes of N/V/D last week but since has not had any episodes. Pt in NAD.

## 2023-04-08 NOTE — Discharge Instructions (Signed)
As we discussed please drink plenty of fluids.  Please follow-up on your MyChart for your urinalysis results.  Return to the emergency department for any abdominal pain chest pain, or any further symptoms personally concerning to yourself.  Please take your nausea medication as needed but only as prescribed.

## 2023-04-08 NOTE — ED Notes (Signed)
Lt grn and lav tubes sent to lab. 

## 2023-04-08 NOTE — ED Notes (Signed)
Pt given fresh warm blanket.  

## 2023-04-08 NOTE — ED Provider Notes (Signed)
[facility] Provider Note    Event Date/Time   First MD Initiated Contact with Patient 04/08/23 1807     (approximate)  History   Chief Complaint: Weakness  HPI  SHERRIKA WEAKLAND is a 57 y.o. female with a past medical history of arthritis, hypertension, hyperlipidemia, presents to the emergency department for dehydration concerns.  According to the patient last week she had episodes of nausea and vomiting and states since then she has not been eating or drinking as much as she typically does.  Patient states he was able to eat yesterday but today was feeling nauseated and feels like she could be dehydrated.  Patient denies any abdominal pain denies any diarrhea denies any urinary symptoms.  No fever.  States she has noted some slight pain in her lower back at times.  Physical Exam   Triage Vital Signs: ED Triage Vitals [04/08/23 1524]  Enc Vitals Group     BP 109/77     Pulse Rate 88     Resp 18     Temp 98.5 F (36.9 C)     Temp Source Oral     SpO2 99 %     Weight 183 lb (83 kg)     Height 5\' 4"  (1.626 m)     Head Circumference      Peak Flow      Pain Score 5     Pain Loc      Pain Edu?      Excl. in GC?     Most recent vital signs: Vitals:   04/08/23 1524  BP: 109/77  Pulse: 88  Resp: 18  Temp: 98.5 F (36.9 C)  SpO2: 99%    General: Awake, no distress.  CV:  Good peripheral perfusion.  Regular rate and rhythm  Resp:  Normal effort.  Equal breath sounds bilaterally.  Abd:  No distention.  Soft, nontender.  No rebound or guarding.  Benign abdomen.  ED Results / Procedures / Treatments   MEDICATIONS ORDERED IN ED: Medications  sodium chloride 0.9 % bolus 1,000 mL (has no administration in time range)  ondansetron (ZOFRAN) injection 4 mg (has no administration in time range)     IMPRESSION / MDM / ASSESSMENT AND PLAN / ED COURSE  I reviewed the triage vital signs and the nursing notes.  Patient's presentation is most  consistent with acute presentation with potential threat to life or bodily function.  Patient presents to the emergency department for concerns of dehydration.  States she has been nauseated since last week when she had an episode of nausea vomiting has not been eating or drinking as much.  States today she was feeling nauseated once again was unable to take in adequate fluids so she came to the emergency department for evaluation.  Patient denies any abdominal pain denies any chest pain shortness of breath denies any diarrhea at any point.  Overall the patient appears well.  Will check basic labs we will IV hydrate and continue to closely monitor.  Will dose IV Zofran as a precaution.  Patient agreeable to plan of care.  Patient's labs have resulted showing leukocytosis of 19,000, otherwise reassuring CBC, chemistry does not show any significant findings, mild dehydration with anion gap of 11.  Patient denies any chest pain denies any abdominal pain.  Patient states she is ready to go after the fluids are finished.  She is able to provide a urine sample but she states she does not  want a wait for the results and she is ready to go home.  I did discuss with the patient her results including her leukocytosis and recommended that she wait for the urine, she states she can followed up on MyChart.  We will discharge with nausea medication discussed increasing oral hydration.  I will follow-up on the urinalysis.  FINAL CLINICAL IMPRESSION(S) / ED DIAGNOSES   Nausea   Note:  This document was prepared using Dragon voice recognition software and may include unintentional dictation errors.   Minna Antis, MD 04/08/23 2015

## 2023-04-15 ENCOUNTER — Other Ambulatory Visit: Payer: Self-pay | Admitting: Nurse Practitioner

## 2023-04-15 DIAGNOSIS — I639 Cerebral infarction, unspecified: Secondary | ICD-10-CM

## 2023-04-15 DIAGNOSIS — I10 Essential (primary) hypertension: Secondary | ICD-10-CM

## 2023-04-20 ENCOUNTER — Other Ambulatory Visit: Payer: Self-pay | Admitting: Oncology

## 2023-04-24 ENCOUNTER — Other Ambulatory Visit: Payer: Self-pay | Admitting: Internal Medicine

## 2023-04-24 DIAGNOSIS — I10 Essential (primary) hypertension: Secondary | ICD-10-CM

## 2023-04-29 ENCOUNTER — Ambulatory Visit
Admission: RE | Admit: 2023-04-29 | Discharge: 2023-04-29 | Disposition: A | Discharge status: Home / Self Care | Payer: BC Managed Care – PPO | Source: Ambulatory Visit | Attending: Internal Medicine | Admitting: Internal Medicine

## 2023-04-29 DIAGNOSIS — I10 Essential (primary) hypertension: Secondary | ICD-10-CM | POA: Insufficient documentation

## 2023-05-30 ENCOUNTER — Ambulatory Visit: Payer: BC Managed Care – PPO | Admitting: Oncology

## 2023-05-30 ENCOUNTER — Other Ambulatory Visit: Payer: BC Managed Care – PPO

## 2023-06-19 ENCOUNTER — Ambulatory Visit
Admission: RE | Admit: 2023-06-19 | Discharge: 2023-06-19 | Disposition: A | Discharge status: Home / Self Care | Payer: BC Managed Care – PPO | Source: Ambulatory Visit | Attending: Oncology | Admitting: Oncology

## 2023-06-19 DIAGNOSIS — D0512 Intraductal carcinoma in situ of left breast: Secondary | ICD-10-CM | POA: Insufficient documentation

## 2023-06-19 DIAGNOSIS — Z1231 Encounter for screening mammogram for malignant neoplasm of breast: Secondary | ICD-10-CM | POA: Diagnosis not present

## 2023-06-26 ENCOUNTER — Other Ambulatory Visit: Payer: BC Managed Care – PPO

## 2023-06-26 ENCOUNTER — Other Ambulatory Visit: Payer: Self-pay

## 2023-06-26 DIAGNOSIS — E876 Hypokalemia: Secondary | ICD-10-CM

## 2023-06-26 DIAGNOSIS — I639 Cerebral infarction, unspecified: Secondary | ICD-10-CM

## 2023-06-26 DIAGNOSIS — I10 Essential (primary) hypertension: Secondary | ICD-10-CM

## 2023-06-27 LAB — HEPATIC FUNCTION PANEL
ALT: 15 IU/L (ref 0–32)
AST: 24 IU/L (ref 0–40)
Albumin: 4.3 g/dL (ref 3.8–4.9)
Alkaline Phosphatase: 70 IU/L (ref 44–121)
Bilirubin Total: 0.3 mg/dL (ref 0.0–1.2)
Bilirubin, Direct: 0.1 mg/dL (ref 0.00–0.40)
Total Protein: 7.6 g/dL (ref 6.0–8.5)

## 2023-06-27 LAB — LIPID PANEL
Chol/HDL Ratio: 2.8 ratio (ref 0.0–4.4)
Cholesterol, Total: 124 mg/dL (ref 100–199)
HDL: 45 mg/dL (ref >39)
LDL Chol Calc (NIH): 63 mg/dL (ref 0–99)
Triglycerides: 79 mg/dL (ref 0–149)
VLDL Cholesterol Cal: 16 mg/dL (ref 5–40)

## 2023-06-27 LAB — CK: Total CK: 180 U/L (ref 32–182)

## 2023-07-01 ENCOUNTER — Ambulatory Visit (INDEPENDENT_AMBULATORY_CARE_PROVIDER_SITE_OTHER): Payer: BC Managed Care – PPO | Admitting: Internal Medicine

## 2023-07-01 ENCOUNTER — Encounter: Payer: Self-pay | Admitting: Internal Medicine

## 2023-07-01 VITALS — BP 130/85 | HR 85 | Ht 64.0 in | Wt 177.0 lb

## 2023-07-01 DIAGNOSIS — I10 Essential (primary) hypertension: Secondary | ICD-10-CM | POA: Diagnosis not present

## 2023-07-01 DIAGNOSIS — I679 Cerebrovascular disease, unspecified: Secondary | ICD-10-CM | POA: Insufficient documentation

## 2023-07-01 DIAGNOSIS — I6330 Cerebral infarction due to thrombosis of unspecified cerebral artery: Secondary | ICD-10-CM | POA: Diagnosis not present

## 2023-07-01 MED ORDER — ROSUVASTATIN CALCIUM 40 MG PO TABS
40.0000 mg | ORAL_TABLET | Freq: Every evening | ORAL | 0 refills | Status: DC
Start: 2023-07-01 — End: 2023-09-30

## 2023-07-01 MED ORDER — OLMESARTAN-AMLODIPINE-HCTZ 40-10-25 MG PO TABS
1.0000 | ORAL_TABLET | Freq: Every day | ORAL | 0 refills | Status: DC
Start: 2023-07-01 — End: 2023-09-30

## 2023-07-01 NOTE — Progress Notes (Signed)
Established Patient Office Visit  Subjective:  Patient ID: Lindsay Guzman, female    DOB: May 27, 1966  Age: 57 y.o. MRN: [id number]  Chief Complaint  Patient presents with   Follow-up    3 mo with lab results    No new complaints, here for lab review and medication refills. LDL and TC well controlled on lab review. Triglycerides also satisfactory with normal CK and LFTs.   No other concerns at this time.   Past Medical History:  Diagnosis Date   Arthritis    Family history of breast cancer    Family history of lung cancer    History of kidney stones    History of stroke 04/18/2020   Hypercholesteremia    Hypertension    Personal history of radiation therapy    Stroke [facility])     Past Surgical History:  Procedure Laterality Date   ABDOMINAL HYSTERECTOMY     BREAST BIOPSY Left ?   neg   BREAST BIOPSY Left 06/17/2020   stereo bx, x-clip, positive   BREAST LUMPECTOMY Left 07/25/2020   CHOLECYSTECTOMY     MASTECTOMY, PARTIAL Left 08/12/2020   Procedure: RE-EXCISION OF INFERIOR MARGIN OF LEFT BREAST, DRAINAGE OF SEROMA OF LEFT BREAST;  Surgeon: Carolan Shiver, MD;  Location: [facility] ORS;  Service: General;  Laterality: Left;    Social History   Socioeconomic History   Marital status: Married    Spouse name: Not on file   Number of children: Not on file   Years of education: Not on file   Highest education level: Not on file  Occupational History   Not on file  Tobacco Use   Smoking status: Former    Current packs/day: 0.00    Types: Cigarettes    Quit date: 04/18/2020    Years since quitting: 3.2   Smokeless tobacco: Never  Substance and Sexual Activity   Alcohol use: Not Currently    Comment: occasionally    Drug use: Yes    Types: Marijuana   Sexual activity: Not on file  Other Topics Concern   Not on file  Social History Narrative   Not on file   Social Determinants of Health   Financial Resource Strain: Not on file  Food Insecurity: Not on file   Transportation Needs: Not on file  Physical Activity: Not on file  Stress: Not on file  Social Connections: Not on file  Intimate Partner Violence: Not on file    Family History  Problem Relation Age of Onset   Breast cancer Maternal Aunt    Breast cancer Maternal Aunt    Breast cancer Maternal Aunt    Lung cancer Maternal Uncle    Cancer Paternal Aunt        unk type   Lung cancer Maternal Aunt     Allergies  Allergen Reactions   Other Anaphylaxis and Swelling    Nuts    Penicillins Hives    Review of Systems  Constitutional: Negative.   HENT: Negative.    Eyes: Negative.   Respiratory: Negative.    Cardiovascular: Negative.   Gastrointestinal: Negative.   Genitourinary: Negative.   Musculoskeletal:  Positive for back pain and joint pain.  Skin: Negative.   Neurological: Negative.   Endo/Heme/Allergies: Negative.        Objective:   BP 130/85   Pulse 85   Ht 5\' 4"  (1.626 m)   Wt 177 lb (80.3 kg)   SpO2 97%   BMI 30.38 kg/m  Vitals:   07/01/23 0941  BP: 130/85  Pulse: 85  Height: 5\' 4"  (1.626 m)  Weight: 177 lb (80.3 kg)  SpO2: 97%  BMI (Calculated): 30.37    Physical Exam Vitals reviewed.  Constitutional:      General: She is not in acute distress. HENT:     Head: Normocephalic.     Nose: Nose normal.     Mouth/Throat:     Mouth: Mucous membranes are moist.  Eyes:     Extraocular Movements: Extraocular movements intact.     Pupils: Pupils are equal, round, and reactive to light.  Cardiovascular:     Rate and Rhythm: Normal rate and regular rhythm.     Pulses: Normal pulses.     Heart sounds: Murmur heard.     Crescendo decrescendo systolic murmur is present with a grade of 3/6.  Pulmonary:     Effort: Pulmonary effort is normal.     Breath sounds: No rhonchi or rales.  Abdominal:     General: Abdomen is flat.     Palpations: There is no hepatomegaly, splenomegaly or mass.  Musculoskeletal:        General: Normal range of  motion.     Cervical back: Normal range of motion. No tenderness.  Skin:    General: Skin is warm and dry.  Neurological:     General: No focal deficit present.     Mental Status: She is alert and oriented to person, place, and time.     Cranial Nerves: No cranial nerve deficit.     Motor: No weakness.  Psychiatric:        Mood and Affect: Mood normal.        Behavior: Behavior normal.      No results found for any visits on 07/01/23.  Recent Results (from the past 2160 hour([place]))  CBC     Status: Abnormal   Collection Time: 04/08/23  6:37 PM  Result Value Ref Range   WBC 19.4 (H) 4.0 - 10.5 K/uL   RBC 5.10 3.87 - 5.11 MIL/uL   Hemoglobin 14.2 12.0 - 15.0 g/dL   HCT 57.8 46.9 - 62.9 %   MCV 84.5 80.0 - 100.0 fL   MCH 27.8 26.0 - 34.0 pg   MCHC 32.9 30.0 - 36.0 g/dL   RDW 52.8 41.3 - 24.4 %   Platelets 219 150 - 400 K/uL   nRBC 0.0 0.0 - 0.2 %    Comment: Performed at [facility], [address] [place], [place] [address]  Comprehensive metabolic panel     Status: Abnormal   Collection Time: 04/08/23  6:37 PM  Result Value Ref Range   Sodium 134 (L) 135 - 145 mmol/L   Potassium 3.0 (L) 3.5 - 5.1 mmol/L   Chloride 95 (L) 98 - 111 mmol/L   CO2 28 22 - 32 mmol/L   Glucose, Bld 102 (H) 70 - 99 mg/dL    Comment: Glucose reference range applies only to samples taken after fasting for at least 8 hours.   BUN 24 (H) 6 - 20 mg/dL   Creatinine, Ser 2.53 (H) 0.44 - 1.00 mg/dL   Calcium 9.3 8.9 - 66.4 mg/dL   Total Protein 7.5 6.5 - 8.1 g/dL   Albumin 3.9 3.5 - 5.0 g/dL   AST 34 15 - 41 U/L   ALT 36 0 - 44 U/L   Alkaline Phosphatase 65 38 - 126 U/L   Total Bilirubin 1.1 0.3 - 1.2 mg/dL  GFR, Estimated 54 (L) >60 mL/min    Comment: (NOTE) Calculated using the CKD-EPI Creatinine Equation (2021)    Anion gap 11 5 - 15    Comment: Performed at [facility], [address] [place], [place] [address]  Urinalysis, Complete w Microscopic -Urine, Clean Catch      Status: Abnormal   Collection Time: 04/08/23  8:26 PM  Result Value Ref Range   Color, Urine STRAW (A) YELLOW   APPearance CLEAR (A) CLEAR   Specific Gravity, Urine 1.006 1.005 - 1.030   pH 6.0 5.0 - 8.0   Glucose, UA NEGATIVE NEGATIVE mg/dL   Hgb urine dipstick NEGATIVE NEGATIVE   Bilirubin Urine NEGATIVE NEGATIVE   Ketones, ur 5 (A) NEGATIVE mg/dL   Protein, ur NEGATIVE NEGATIVE mg/dL   Nitrite NEGATIVE NEGATIVE   Leukocytes,Ua TRACE (A) NEGATIVE   RBC / HPF 0-5 0 - 5 RBC/hpf   WBC, UA 6-10 0 - 5 WBC/hpf   Bacteria, UA RARE (A) NONE SEEN   Squamous Epithelial / HPF 0-5 0 - 5 /HPF   Mucus PRESENT     Comment: Performed at [facility], [address] [place], [place] [address]  Lipid panel     Status: None   Collection Time: 06/26/23 11:05 AM  Result Value Ref Range   Cholesterol, Total 124 100 - 199 mg/dL   Triglycerides 79 0 - 149 mg/dL   HDL 45 >82 mg/dL   VLDL Cholesterol Cal 16 5 - 40 mg/dL   LDL Chol Calc (NIH) 63 0 - 99 mg/dL   Chol/HDL Ratio 2.8 0.0 - 4.4 ratio    Comment:                                   T. Chol/HDL Ratio                                             Men  Women                               1/2 Avg.Risk  3.4    3.3                                   Avg.Risk  5.0    4.4                                2X Avg.Risk  9.6    7.1                                3X Avg.Risk 23.4   11.0   CK, total     Status: None   Collection Time: 06/26/23 11:05 AM  Result Value Ref Range   Total CK 180 32 - 182 U/L  Liver Profile     Status: None   Collection Time: 06/26/23 11:05 AM  Result Value Ref Range   Total Protein 7.6 6.0 - 8.5 g/dL   Albumin 4.3 3.8 - 4.9 g/dL   Bilirubin Total 0.3 0.0 - 1.2 mg/dL  Bilirubin, Direct 0.10 0.00 - 0.40 mg/dL   Alkaline Phosphatase 70 44 - 121 IU/L   AST 24 0 - 40 IU/L   ALT 15 0 - 32 IU/L      Assessment & Plan:  As per problem list.  Problem List Items Addressed This Visit       Cardiovascular and  Mediastinum   Essential (primary) hypertension   Relevant Medications   Olmesartan-amLODIPine-HCTZ 40-10-25 MG TABS   rosuvastatin (CRESTOR) 40 MG tablet   Cerebrovascular disease - Primary   Relevant Medications   Olmesartan-amLODIPine-HCTZ 40-10-25 MG TABS   rosuvastatin (CRESTOR) 40 MG tablet   Other Visit Diagnoses     Cerebral infarction, unspecified (HCC)       Relevant Medications   rosuvastatin (CRESTOR) 40 MG tablet   Other Relevant Orders   Lipid panel   Comprehensive metabolic panel       Return in about 3 months (around 09/30/2023) for fu with labs prior.   Total time spent: 20 minutes  Luna Fuse, MD  07/01/2023   This document may have been prepared by [facility] Voice Recognition software and as such may include unintentional dictation errors.

## 2023-07-10 ENCOUNTER — Inpatient Hospital Stay (HOSPITAL_BASED_OUTPATIENT_CLINIC_OR_DEPARTMENT_OTHER): Payer: BC Managed Care – PPO | Admitting: Oncology

## 2023-07-10 ENCOUNTER — Encounter: Payer: Self-pay | Admitting: Oncology

## 2023-07-10 ENCOUNTER — Inpatient Hospital Stay: Payer: BC Managed Care – PPO | Attending: Oncology

## 2023-07-10 VITALS — BP 137/84 | HR 50 | Temp 98.3°F | Resp 18 | Wt 178.4 lb

## 2023-07-10 DIAGNOSIS — Z79811 Long term (current) use of aromatase inhibitors: Secondary | ICD-10-CM | POA: Insufficient documentation

## 2023-07-10 DIAGNOSIS — Z809 Family history of malignant neoplasm, unspecified: Secondary | ICD-10-CM | POA: Diagnosis not present

## 2023-07-10 DIAGNOSIS — I10 Essential (primary) hypertension: Secondary | ICD-10-CM | POA: Insufficient documentation

## 2023-07-10 DIAGNOSIS — E876 Hypokalemia: Secondary | ICD-10-CM | POA: Diagnosis not present

## 2023-07-10 DIAGNOSIS — M810 Age-related osteoporosis without current pathological fracture: Secondary | ICD-10-CM | POA: Insufficient documentation

## 2023-07-10 DIAGNOSIS — Z801 Family history of malignant neoplasm of trachea, bronchus and lung: Secondary | ICD-10-CM | POA: Diagnosis not present

## 2023-07-10 DIAGNOSIS — N6310 Unspecified lump in the right breast, unspecified quadrant: Secondary | ICD-10-CM | POA: Diagnosis not present

## 2023-07-10 DIAGNOSIS — Z79899 Other long term (current) drug therapy: Secondary | ICD-10-CM | POA: Insufficient documentation

## 2023-07-10 DIAGNOSIS — Z803 Family history of malignant neoplasm of breast: Secondary | ICD-10-CM | POA: Diagnosis not present

## 2023-07-10 DIAGNOSIS — D0512 Intraductal carcinoma in situ of left breast: Secondary | ICD-10-CM

## 2023-07-10 DIAGNOSIS — Z90710 Acquired absence of both cervix and uterus: Secondary | ICD-10-CM | POA: Diagnosis not present

## 2023-07-10 DIAGNOSIS — Z880 Allergy status to penicillin: Secondary | ICD-10-CM | POA: Diagnosis not present

## 2023-07-10 DIAGNOSIS — Z87442 Personal history of urinary calculi: Secondary | ICD-10-CM | POA: Insufficient documentation

## 2023-07-10 DIAGNOSIS — Z9049 Acquired absence of other specified parts of digestive tract: Secondary | ICD-10-CM | POA: Diagnosis not present

## 2023-07-10 DIAGNOSIS — Z8673 Personal history of transient ischemic attack (TIA), and cerebral infarction without residual deficits: Secondary | ICD-10-CM | POA: Insufficient documentation

## 2023-07-10 DIAGNOSIS — Z87891 Personal history of nicotine dependence: Secondary | ICD-10-CM | POA: Insufficient documentation

## 2023-07-10 LAB — CBC WITH DIFFERENTIAL/PLATELET
Abs Immature Granulocytes: 0.02 10*3/uL (ref 0.00–0.07)
Basophils Absolute: 0.1 10*3/uL (ref 0.0–0.1)
Basophils Relative: 1 %
Eosinophils Absolute: 0.2 10*3/uL (ref 0.0–0.5)
Eosinophils Relative: 2 %
HCT: 40.6 % (ref 36.0–46.0)
Hemoglobin: 13.1 g/dL (ref 12.0–15.0)
Immature Granulocytes: 0 %
Lymphocytes Relative: 30 %
Lymphs Abs: 2.5 10*3/uL (ref 0.7–4.0)
MCH: 27.5 pg (ref 26.0–34.0)
MCHC: 32.3 g/dL (ref 30.0–36.0)
MCV: 85.1 fL (ref 80.0–100.0)
Monocytes Absolute: 0.5 10*3/uL (ref 0.1–1.0)
Monocytes Relative: 6 %
Neutro Abs: 5.2 10*3/uL (ref 1.7–7.7)
Neutrophils Relative %: 61 %
Platelets: 275 10*3/uL (ref 150–400)
RBC: 4.77 MIL/uL (ref 3.87–5.11)
RDW: 13.6 % (ref 11.5–15.5)
WBC: 8.4 10*3/uL (ref 4.0–10.5)
nRBC: 0 % (ref 0.0–0.2)

## 2023-07-10 LAB — COMPREHENSIVE METABOLIC PANEL
ALT: 17 U/L (ref 0–44)
AST: 23 U/L (ref 15–41)
Albumin: 4.4 g/dL (ref 3.5–5.0)
Alkaline Phosphatase: 59 U/L (ref 38–126)
Anion gap: 10 (ref 5–15)
BUN: 13 mg/dL (ref 6–20)
CO2: 26 mmol/L (ref 22–32)
Calcium: 9.6 mg/dL (ref 8.9–10.3)
Chloride: 101 mmol/L (ref 98–111)
Creatinine, Ser: 0.89 mg/dL (ref 0.44–1.00)
GFR, Estimated: >60 mL/min (ref >60)
Glucose, Bld: 92 mg/dL (ref 70–99)
Potassium: 3.3 mmol/L — ABNORMAL LOW (ref 3.5–5.1)
Sodium: 137 mmol/L (ref 135–145)
Total Bilirubin: 0.7 mg/dL (ref 0.3–1.2)
Total Protein: 8.1 g/dL (ref 6.5–8.1)

## 2023-07-10 NOTE — Assessment & Plan Note (Addendum)
History of physical examination suggests infectious etiology. No leukocytosis, no systemic symptoms.  Will obtain right diagnostic mammogram and [place]  Discussed with Dr. Maia Plan who will evaluate patient.   Addendum, ultrasound showed possible inflamed epidermal inclusion.  I recommend patient to take doxycycline 100 mg twice daily for 5 days.  Discussed the case with Dr. Maia Plan.  Recommendation is for patient to call surgery office to arrange follow-up.  I called the patient and left a voice message as well as sent her MyChart message.

## 2023-07-10 NOTE — Assessment & Plan Note (Addendum)
Left breast high-grade DCIS, ER positive, status post lumpectomy and optional sentinel lymph node biopsy and re-excision of margin.  pTis pN0.  Labs reviewed and discussed with patient. Continue Arimidex 1 mg daily, plan 5 years-Jan 2027 .  Refills were sent to pharmacy. Recent mammogram results were reviewed with patient. -negative for malignancy Annual Mammogram surveillance - Sept 2025

## 2023-07-10 NOTE — Assessment & Plan Note (Signed)
05/18/2021 bone density showed left femoral neck T- 2.6.  Discussed about bisphosphonate and patient declined. She also is not interested in switching to Tamoxifen Recommend patient to take calcium and vitamin D supplementation

## 2023-07-10 NOTE — Progress Notes (Addendum)
Hematology/Oncology Progress note [phone] [phone]      Patient Care Team: Sherron Monday, MD as PCP - General (Internal Medicine) [name], MD as Consulting Physician (Oncology) Carolan Shiver, MD as Consulting Physician (General Surgery) Carmina Miller, MD as Referring Physician (Radiation Oncology) Scarlett Presto, RN (Inactive) as Oncology Nurse Navigator (Oncology)  ASSESSMENT & PLAN:   Cancer Staging  Ductal carcinoma in situ (DCIS) of left breast Staging form: Breast, AJCC 8th Edition - Clinical stage from 07/27/2020: Stage 0 (cTis (DCIS), cN0, cM0, G3, ER+, PR: Not Assessed, HER2: Not Assessed) - Signed by [name], MD on 11/08/2022   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  pTis pN0.  Labs reviewed and discussed with patient. Continue Arimidex 1 mg daily, plan 5 years-Jan 2027 .  Refills were sent to pharmacy. Recent mammogram results were reviewed with patient. -negative for malignancy Annual Mammogram surveillance - Sept 2025  Osteoporosis 05/18/2021 bone density showed left femoral neck T- 2.6.  Discussed about bisphosphonate and patient declined. She also is not interested in switching to Tamoxifen Recommend patient to take calcium and vitamin D supplementation   Hypokalemia Chronic. She will discuss with her PCP for potassium supplementation.   Breast mass, right History of physical examination suggests infectious etiology. No leukocytosis, no systemic symptoms.  Will obtain right diagnostic mammogram and [place]  Discussed with Dr. Maia Plan who will evaluate patient.   Addendum, ultrasound showed possible inflamed epidermal inclusion.  I recommend patient to take doxycycline 100 mg twice daily for 5 days.  Discussed the case with Dr. Maia Plan.  Recommendation is for patient to call surgery  office to arrange follow-up.  I called the patient and left a voice message as well as sent her MyChart message.   Orders Placed This Encounter  Procedures   MM 3D DIAGNOSTIC MAMMOGRAM UNILATERAL RIGHT BREAST    Standing Status:   Future    Standing Expiration Date:   07/09/2024    Order Specific Question:   Reason for Exam (SYMPTOM  OR DIAGNOSIS REQUIRED)    Answer:   right breast palpable mass at 2 o'clock    Order Specific Question:   Is the patient pregnant?    Answer:   No    Order Specific Question:   Preferred imaging location?    Answer:   [facility]   [place] LIMITED ULTRASOUND INCLUDING AXILLA RIGHT BREAST    Standing Status:   Future    Standing Expiration Date:   07/09/2024    Order Specific Question:   Reason for Exam (SYMPTOM  OR DIAGNOSIS REQUIRED)    Answer:   right breast palpable mass at 2 o'clock    Order Specific Question:   Preferred imaging location?    Answer:   [facility]   Follow up in 6 months.  . All questions were answered. The patient knows to call the clinic with any problems, questions or concerns.  [name], MD, PhD [facility] Hematology Oncology 07/10/2023   CHIEF COMPLAINTS/REASON FOR VISIT:  Follow up for breast cancer DCIS  HISTORY OF PRESENTING ILLNESS:  Lindsay Guzman is a  57 y.o.  female with PMH listed below who presents for follow-up of left breast DCIS. Oncology history listed as below. Oncology History  Ductal carcinoma in situ (DCIS) of left breast  06/09/2020 Imaging   06/09/2020 bilateral diagnostic mammogram showed  Indeterminate medial left breast calcifications spanning 2.3 cm.  No other suspicious findings.  Medial right breast mass has been stable since 2012.   06/22/2020 Initial Diagnosis   Ductal carcinoma in situ (DCIS) of left breast  -06/17/2020 patient underwent stereotactic biopsy of left breast calcifications.  Biopsy showed high-grade DCIS with comedonecrosis.  Associated with calcification. -07/27/2020,  patient proceed with left breast lumpectomy- Dr.Cintron,  Due to the high-grade DCIS, patient opted to do sentinel lymph node biopsy at the same time of lumpectomy surgery.Pathology showed left breast high-grade DCIS, with comedonecrosis, negative for invasive component.  No lymph node involvement. Closest DCIS margin was 0.5 mm.  ER positive 08/12/20 re-excision of inferior margin.pathology showed negative for residual DCIS, or invasive carcinoma.      07/27/2020 Cancer Staging   Staging form: Breast, AJCC 8th Edition - Clinical stage from 07/27/2020: Stage 0 (cTis (DCIS), cN0, cM0, G3, ER+, PR: Not Assessed, HER2: Not Assessed) - Signed by [name], MD on 11/08/2022 Stage prefix: Initial diagnosis Nuclear grade: G3 Histologic grading system: 3 grade system   09/21/2020 Genetic Testing   [facility] CustomNext+RNA cancer panel found no pathogenic mutations.    10/21/2020 -  Radiation Therapy   Adjuvant Radiation   10/2020 -  Anti-estrogen oral therapy   Started on Arimidex   06/12/2021 Mammogram   Bilateral diagnostic mammogram showed Interval postoperative changes in the lower inner left breast at posterior depth. No mammographic findings of malignancy in either breast.    INTERVAL HISTORY [name] is a 57 y.o. female who has above history reviewed by me today presents for follow up visit for management of left breast high-grade DCIS Patient tolerates Arimidex 1 mg daily.  Manageable side effect.  She has noticed a tender right breast spot for 7-10 days. No fever chills.    Review of Systems  Constitutional:  Negative for appetite change, chills, fatigue and fever.  HENT:   Negative for hearing loss and voice change.   Eyes:  Negative for eye problems.  Respiratory:  Negative for chest tightness, cough and shortness of breath.   Cardiovascular:  Negative for chest pain.  Gastrointestinal:  Negative for abdominal distention, abdominal pain and blood in stool.  Endocrine: Negative  for hot flashes.  Genitourinary:  Negative for difficulty urinating and frequency.   Musculoskeletal:  Positive for arthralgias.  Skin:  Negative for itching and rash.  Neurological:  Negative for extremity weakness.  Hematological:  Negative for adenopathy.  Psychiatric/Behavioral:  Negative for confusion.      MEDICAL HISTORY:  Past Medical History:  Diagnosis Date   Arthritis    Family history of breast cancer    Family history of lung cancer    History of kidney stones    History of stroke 04/18/2020   Hypercholesteremia    Hypertension    Personal history of radiation therapy    Stroke [facility])     SURGICAL HISTORY: Past Surgical History:  Procedure Laterality Date   ABDOMINAL HYSTERECTOMY     BREAST BIOPSY Left ?   neg   BREAST BIOPSY Left 06/17/2020   stereo bx, x-clip, positive   BREAST LUMPECTOMY Left 07/25/2020   CHOLECYSTECTOMY     MASTECTOMY, PARTIAL Left 08/12/2020   Procedure: RE-EXCISION OF INFERIOR MARGIN OF LEFT BREAST, DRAINAGE OF SEROMA OF LEFT BREAST;  Surgeon: Carolan Shiver, MD;  Location: [facility] ORS;  Service: General;  Laterality: Left;    SOCIAL HISTORY: Social History   Socioeconomic History   Marital status:  Married    Spouse name: Not on file   Number of children: Not on file   Years of education: Not on file   Highest education level: Not on file  Occupational History   Not on file  Tobacco Use   Smoking status: Former    Current packs/day: 0.00    Types: Cigarettes    Quit date: 04/18/2020    Years since quitting: 3.2   Smokeless tobacco: Never  Substance and Sexual Activity   Alcohol use: Not Currently    Comment: occasionally    Drug use: Yes    Types: Marijuana   Sexual activity: Not on file  Other Topics Concern   Not on file  Social History Narrative   Not on file   Social Determinants of Health   Financial Resource Strain: Not on file  Food Insecurity: Not on file  Transportation Needs: Not on file  Physical  Activity: Not on file  Stress: Not on file  Social Connections: Not on file  Intimate Partner Violence: Not on file    FAMILY HISTORY: Family History  Problem Relation Age of Onset   Breast cancer Maternal Aunt    Breast cancer Maternal Aunt    Breast cancer Maternal Aunt    Lung cancer Maternal Uncle    Cancer Paternal Aunt        unk type   Lung cancer Maternal Aunt     ALLERGIES:  is allergic to other and penicillins.  MEDICATIONS:  Current Outpatient Medications  Medication Sig Dispense Refill   anastrozole (ARIMIDEX) 1 MG tablet Take 1 tablet by mouth once daily 90 tablet 0   aspirin EC 81 MG tablet Take 81 mg by mouth daily. Swallow whole.      calcium carbonate (OSCAL) 1500 (600 Ca) MG TABS tablet Take by mouth 2 (two) times daily with a meal.     cholecalciferol (VITAMIN D3) 25 MCG (1000 UNIT) tablet Take 1,000 Units by mouth daily.     hydrALAZINE (APRESOLINE) 100 MG tablet Take 100 mg by mouth 2 (two) times daily as needed.     hydrALAZINE (APRESOLINE) 100 MG tablet Take 1 tablet by mouth 3 (three) times daily.     Multiple Vitamins-Minerals (CENTRUM SILVER ADULT 50+ PO) Take 1 tablet by mouth daily.     Olmesartan-amLODIPine-HCTZ 40-10-25 MG TABS Take 1 tablet by mouth daily. TAKE 1 TABLET BY MOUTH EVERY DAY IN THE MORNING 90 tablet 0   ondansetron (ZOFRAN-ODT) 4 MG disintegrating tablet Take 1 tablet (4 mg total) by mouth every 8 (eight) hours as needed for nausea or vomiting. 20 tablet 0   rosuvastatin (CRESTOR) 40 MG tablet Take 1 tablet (40 mg total) by mouth at bedtime. 90 tablet 0   Current Facility-Administered Medications  Medication Dose Route Frequency Provider Last Rate Last Admin   potassium chloride SA (KLOR-CON M) CR tablet 20 mEq  20 mEq Oral Once Sherron Monday, MD         PHYSICAL EXAMINATION: ECOG PERFORMANCE STATUS: 0 - Asymptomatic Vitals:   07/10/23 1004  BP: 137/84  Pulse: (!) 50  Resp: 18  Temp: 98.3 F (36.8 C)  SpO2: (!) 81%    Filed Weights   07/10/23 1004  Weight: 178 lb 6.4 oz (80.9 kg)    Physical Exam Constitutional:      General: She is not in acute distress.    Appearance: She is not diaphoretic.  HENT:     Head: Normocephalic and atraumatic.  Eyes:     General: No scleral icterus. Cardiovascular:     Rate and Rhythm: Normal rate and regular rhythm.  Pulmonary:     Effort: Pulmonary effort is normal. No respiratory distress.  Abdominal:     General: There is no distension.     Palpations: Abdomen is soft.  Musculoskeletal:        General: Normal range of motion.     Cervical back: Normal range of motion and neck supple.  Skin:    General: Skin is warm and dry.     Findings: No erythema.  Neurological:     Mental Status: She is alert and oriented to person, place, and time. Mental status is at baseline.     Motor: No abnormal muscle tone.  Psychiatric:        Mood and Affect: Mood and affect normal.    Right breast inner upper quadrant, 2-3 cm breast mass, tender with palpation. Focal erythematous skin changes.    LABORATORY DATA:  I have reviewed the data as listed    Latest Ref Rng & Units 07/10/2023    9:46 AM 04/08/2023    6:37 PM 03/25/2023    2:49 PM  CBC  WBC 4.0 - 10.5 K/uL 8.4  19.4  6.3   Hemoglobin 12.0 - 15.0 g/dL 16.1  09.6  04.5   Hematocrit 36.0 - 46.0 % 40.6  43.1  41.7   Platelets 150 - 400 K/uL 275  219  263       Latest Ref Rng & Units 06/26/2023   11:05 AM 04/08/2023    6:37 PM 03/25/2023    2:49 PM  CMP  Glucose 70 - 99 mg/dL  409  91   BUN 6 - 20 mg/dL  24  12   Creatinine 8.11 - 1.00 mg/dL  9.14  7.82   Sodium 956 - 145 mmol/L  134  140   Potassium 3.5 - 5.1 mmol/L  3.0  3.8   Chloride 98 - 111 mmol/L  95  100   CO2 22 - 32 mmol/L  28  26   Calcium 8.9 - 10.3 mg/dL  9.3  9.6   Total Protein 6.0 - 8.5 g/dL 7.6  7.5  7.3   Total Bilirubin 0.0 - 1.2 mg/dL 0.3  1.1  0.3   Alkaline Phos 44 - 121 IU/L 70  65  76   AST 0 - 40 IU/L 24  34  20   ALT 0 - 32  IU/L 15  36  16

## 2023-07-10 NOTE — Assessment & Plan Note (Signed)
Chronic. She will discuss with her PCP for potassium supplementation.

## 2023-07-11 ENCOUNTER — Ambulatory Visit
Admission: RE | Admit: 2023-07-11 | Discharge: 2023-07-11 | Disposition: A | Discharge status: Home / Self Care | Payer: BC Managed Care – PPO | Source: Ambulatory Visit | Attending: Oncology | Admitting: Oncology

## 2023-07-11 ENCOUNTER — Other Ambulatory Visit: Payer: Self-pay | Admitting: Internal Medicine

## 2023-07-11 DIAGNOSIS — I10 Essential (primary) hypertension: Secondary | ICD-10-CM

## 2023-07-11 DIAGNOSIS — N6310 Unspecified lump in the right breast, unspecified quadrant: Secondary | ICD-10-CM

## 2023-07-12 ENCOUNTER — Other Ambulatory Visit: Payer: Self-pay | Admitting: Oncology

## 2023-07-12 ENCOUNTER — Other Ambulatory Visit: Payer: Self-pay | Admitting: Internal Medicine

## 2023-07-12 DIAGNOSIS — I6330 Cerebral infarction due to thrombosis of unspecified cerebral artery: Secondary | ICD-10-CM

## 2023-07-12 DIAGNOSIS — R928 Other abnormal and inconclusive findings on diagnostic imaging of breast: Secondary | ICD-10-CM

## 2023-07-12 MED ORDER — DOXYCYCLINE HYCLATE 100 MG PO TABS: 100.0000 mg | ORAL_TABLET | Freq: Two times a day (BID) | ORAL | 0 refills | Status: DC

## 2023-07-15 ENCOUNTER — Telehealth: Payer: Self-pay

## 2023-07-15 DIAGNOSIS — D0512 Intraductal carcinoma in situ of left breast: Secondary | ICD-10-CM

## 2023-07-15 NOTE — Telephone Encounter (Signed)
-----   Message from [name] sent at 07/12/2023  4:20 PM EDT ----- My chart message sent.  Please arrange patient to follow-up with me in 6 months, lab MD CBC CMP.  Thank you

## 2023-07-18 ENCOUNTER — Other Ambulatory Visit: Payer: Self-pay | Admitting: Oncology

## 2023-08-06 ENCOUNTER — Telehealth: Payer: Self-pay

## 2023-08-06 NOTE — Telephone Encounter (Signed)
Please call patient to let her know paperwork is ready for pick up, I will get it faxed today

## 2023-09-13 ENCOUNTER — Emergency Department
Admission: EM | Admit: 2023-09-13 | Discharge: 2023-09-13 | Disposition: A | Discharge status: Home / Self Care | Payer: BC Managed Care – PPO | Attending: Emergency Medicine | Admitting: Emergency Medicine

## 2023-09-13 ENCOUNTER — Other Ambulatory Visit: Payer: Self-pay

## 2023-09-13 DIAGNOSIS — R1010 Upper abdominal pain, unspecified: Secondary | ICD-10-CM | POA: Insufficient documentation

## 2023-09-13 DIAGNOSIS — E876 Hypokalemia: Secondary | ICD-10-CM | POA: Insufficient documentation

## 2023-09-13 DIAGNOSIS — Z20822 Contact with and (suspected) exposure to covid-19: Secondary | ICD-10-CM | POA: Insufficient documentation

## 2023-09-13 DIAGNOSIS — R112 Nausea with vomiting, unspecified: Secondary | ICD-10-CM | POA: Insufficient documentation

## 2023-09-13 LAB — LIPASE, BLOOD: Lipase: 29 U/L (ref 11–51)

## 2023-09-13 LAB — RESP PANEL BY RT-PCR (RSV, FLU A&B, COVID)  RVPGX2
Influenza A by PCR: NEGATIVE
Influenza B by PCR: NEGATIVE
Resp Syncytial Virus by PCR: NEGATIVE
SARS Coronavirus 2 by RT PCR: NEGATIVE

## 2023-09-13 LAB — URINALYSIS, ROUTINE W REFLEX MICROSCOPIC
Bilirubin Urine: NEGATIVE
Glucose, UA: NEGATIVE mg/dL
Ketones, ur: 5 mg/dL — AB
Nitrite: NEGATIVE
Protein, ur: 100 mg/dL — AB
RBC / HPF: >50 RBC/hpf (ref 0–5)
Specific Gravity, Urine: 1.023 (ref 1.005–1.030)
WBC, UA: >50 WBC/hpf (ref 0–5)
pH: 5 (ref 5.0–8.0)

## 2023-09-13 LAB — CBC
HCT: 45.3 % (ref 36.0–46.0)
Hemoglobin: 15.1 g/dL — ABNORMAL HIGH (ref 12.0–15.0)
MCH: 27.9 pg (ref 26.0–34.0)
MCHC: 33.3 g/dL (ref 30.0–36.0)
MCV: 83.7 fL (ref 80.0–100.0)
Platelets: 286 10*3/uL (ref 150–400)
RBC: 5.41 MIL/uL — ABNORMAL HIGH (ref 3.87–5.11)
RDW: 12.9 % (ref 11.5–15.5)
WBC: 9.1 10*3/uL (ref 4.0–10.5)
nRBC: 0 % (ref 0.0–0.2)

## 2023-09-13 LAB — COMPREHENSIVE METABOLIC PANEL
ALT: 81 U/L — ABNORMAL HIGH (ref 0–44)
AST: 55 U/L — ABNORMAL HIGH (ref 15–41)
Albumin: 4.1 g/dL (ref 3.5–5.0)
Alkaline Phosphatase: 63 U/L (ref 38–126)
Anion gap: 13 (ref 5–15)
BUN: 27 mg/dL — ABNORMAL HIGH (ref 6–20)
CO2: 29 mmol/L (ref 22–32)
Calcium: 9.2 mg/dL (ref 8.9–10.3)
Chloride: 93 mmol/L — ABNORMAL LOW (ref 98–111)
Creatinine, Ser: 1.26 mg/dL — ABNORMAL HIGH (ref 0.44–1.00)
GFR, Estimated: 49 mL/min — ABNORMAL LOW (ref >60)
Glucose, Bld: 102 mg/dL — ABNORMAL HIGH (ref 70–99)
Potassium: 3 mmol/L — ABNORMAL LOW (ref 3.5–5.1)
Sodium: 135 mmol/L (ref 135–145)
Total Bilirubin: 1 mg/dL (ref <1.2)
Total Protein: 7.7 g/dL (ref 6.5–8.1)

## 2023-09-13 MED ORDER — ALUM & MAG HYDROXIDE-SIMETH 200-200-20 MG/5ML PO SUSP
30.0000 mL | Freq: Once | ORAL | Status: AC
  Administered 2023-09-13: 30 mL via ORAL
  Filled 2023-09-13: qty 30

## 2023-09-13 MED ORDER — FAMOTIDINE 20 MG PO TABS
40.0000 mg | ORAL_TABLET | Freq: Once | ORAL | Status: AC
  Administered 2023-09-13: 40 mg via ORAL
  Filled 2023-09-13: qty 2

## 2023-09-13 MED ORDER — ONDANSETRON 4 MG PO TBDP
8.0000 mg | ORAL_TABLET | Freq: Once | ORAL | Status: AC
Start: 2023-09-13 — End: 2023-09-13
  Administered 2023-09-13: 8 mg via ORAL
  Filled 2023-09-13: qty 2

## 2023-09-13 MED ORDER — NITROFURANTOIN MACROCRYSTAL 100 MG PO CAPS
100.0000 mg | ORAL_CAPSULE | Freq: Two times a day (BID) | ORAL | 0 refills | Status: DC
Start: 2023-09-13 — End: 2023-11-02

## 2023-09-13 MED ORDER — ONDANSETRON 4 MG PO TBDP
4.0000 mg | ORAL_TABLET | Freq: Three times a day (TID) | ORAL | 0 refills | Status: DC | PRN
Start: 2023-09-13 — End: 2023-11-02

## 2023-09-13 MED ORDER — FAMOTIDINE 20 MG PO TABS: 20.0000 mg | ORAL_TABLET | Freq: Two times a day (BID) | ORAL | 0 refills | Status: AC

## 2023-09-13 NOTE — ED Provider Notes (Signed)
[facility] Provider Note    Event Date/Time   First MD Initiated Contact with Patient 09/13/23 1018     (approximate)   History   Chief Complaint: Emesis   HPI  Lindsay Guzman is a 57 y.o. female who comes ED complaining of nausea and vomiting with some mild upper abdominal pain for the last 4 days, started after eating leftover takeout chicken.  Symptoms have been waxing and waning but persistent.  No fever chills chest pain shortness of breath.  Nonradiating.  No diarrhea.          Physical Exam   Triage Vital Signs: ED Triage Vitals  Encounter Vitals Group     BP 09/13/23 0840 118/83     Systolic BP Percentile --      Diastolic BP Percentile --      Pulse Rate 09/13/23 0840 90     Resp 09/13/23 0840 17     Temp 09/13/23 0838 98.5 F (36.9 C)     Temp Source 09/13/23 0838 Oral     SpO2 09/13/23 0840 100 %     Weight 09/13/23 0838 178 lb 5.6 oz (80.9 kg)     Height 09/13/23 0838 5\' 4"  (1.626 m)     Head Circumference --      Peak Flow --      Pain Score 09/13/23 0838 5     Pain Loc --      Pain Education --      Exclude from Growth Chart --     Most recent vital signs: Vitals:   09/13/23 0939 09/13/23 1239  BP:  101/65  Pulse:  65  Resp:  18  Temp:  98.3 F (36.8 C)  SpO2: 99% 99%    General: Awake, no distress.  CV:  Good peripheral perfusion.  Regular rate rhythm Resp:  Normal effort.  Clear to auscultation bilaterally Abd:  No distention.  Mild left upper quadrant tenderness Other:  Moist oral mucosa   ED Results / Procedures / Treatments   Labs (all labs ordered are listed, but only abnormal results are displayed) Labs Reviewed  COMPREHENSIVE METABOLIC PANEL - Abnormal; Notable for the following components:      Result Value   Potassium 3.0 (*)    Chloride 93 (*)    Glucose, Bld 102 (*)    BUN 27 (*)    Creatinine, Ser 1.26 (*)    AST 55 (*)    ALT 81 (*)    GFR, Estimated 49 (*)    All other components  within normal limits  CBC - Abnormal; Notable for the following components:   RBC 5.41 (*)    Hemoglobin 15.1 (*)    All other components within normal limits  URINALYSIS, ROUTINE W REFLEX MICROSCOPIC - Abnormal; Notable for the following components:   Color, Urine AMBER (*)    APPearance TURBID (*)    Hgb urine dipstick MODERATE (*)    Ketones, ur 5 (*)    Protein, ur 100 (*)    Leukocytes,Ua LARGE (*)    Bacteria, UA MANY (*)    All other components within normal limits  RESP PANEL BY RT-PCR (RSV, FLU A&B, COVID)  [id number]  LIPASE, BLOOD     EKG    RADIOLOGY    PROCEDURES:  Procedures   MEDICATIONS ORDERED IN ED: Medications  ondansetron (ZOFRAN-ODT) disintegrating tablet 8 mg (8 mg Oral Given 09/13/23 1037)  famotidine (PEPCID) tablet 40 mg (40 mg  Oral Given 09/13/23 1035)  alum & mag hydroxide-simeth (MAALOX/MYLANTA) 200-200-20 MG/5ML suspension 30 mL (30 mLs Oral Given 09/13/23 1037)     IMPRESSION / MDM / ASSESSMENT AND PLAN / ED COURSE  I reviewed the triage vital signs and the nursing notes.  DDx: Gastritis, foodborne illness, dehydration, AKI, COVID, influenza, pancreatitis, electrolyte abnormality  Patient's presentation is most consistent with acute presentation with potential threat to life or bodily function.  Patient presents with upper abdominal pain nausea and vomiting, exam suggestive of gastritis.  She appears mildly dehydrated but vital signs are normal, labs are reassuring except for mild hypokalemia.  UA consistent with UTI.  Will give oral Zofran and antacids and p.o. trial.   Considering the patient's symptoms, medical history, and physical examination today, I have low suspicion for cholecystitis or biliary pathology, pancreatitis, perforation or bowel obstruction, hernia, intra-abdominal abscess, AAA or dissection, volvulus or intussusception, mesenteric ischemia, or appendicitis.  ----------------------------------------- 1:46 PM on  09/13/2023 ----------------------------------------- Pt feeling better, tolerating PO, reassuring workup. No dysuria, but UA concerning for UTI. Will tx with macrobid x 3 days. Stable for DC.      FINAL CLINICAL IMPRESSION(S) / ED DIAGNOSES   Final diagnoses:  Nausea and vomiting, unspecified vomiting type     Rx / DC Orders   ED Discharge Orders          Ordered    nitrofurantoin (MACRODANTIN) 100 MG capsule  2 times daily        09/13/23 1345    famotidine (PEPCID) 20 MG tablet  2 times daily        09/13/23 1345    ondansetron (ZOFRAN-ODT) 4 MG disintegrating tablet  Every 8 hours PRN        09/13/23 1345             Note:  This document was prepared using Dragon voice recognition software and may include unintentional dictation errors.   Sharman Cheek, MD 09/13/23 [phone]

## 2023-09-13 NOTE — ED Notes (Signed)
MD stafford at bedside. 

## 2023-09-13 NOTE — ED Notes (Signed)
Pt tolerated PO pills and water

## 2023-09-13 NOTE — ED Triage Notes (Signed)
Pt here with emesis. Pt states she has been vomiting since Monday with continuing nausea. Pt also states she has been having chills. Pt endorses upper abd pain. Pt ambulatory to triage.

## 2023-09-27 ENCOUNTER — Other Ambulatory Visit (INDEPENDENT_AMBULATORY_CARE_PROVIDER_SITE_OTHER): Payer: BC Managed Care – PPO

## 2023-09-28 LAB — LIPID PANEL
Chol/HDL Ratio: 2.8 {ratio} (ref 0.0–4.4)
Cholesterol, Total: 122 mg/dL (ref 100–199)
HDL: 44 mg/dL (ref >39)
LDL Chol Calc (NIH): 63 mg/dL (ref 0–99)
Triglycerides: 76 mg/dL (ref 0–149)
VLDL Cholesterol Cal: 15 mg/dL (ref 5–40)

## 2023-09-28 LAB — HEPATIC FUNCTION PANEL
ALT: 15 [IU]/L (ref 0–32)
AST: 19 [IU]/L (ref 0–40)
Albumin: 4 g/dL (ref 3.8–4.9)
Alkaline Phosphatase: 63 [IU]/L (ref 44–121)
Bilirubin Total: 0.3 mg/dL (ref 0.0–1.2)
Bilirubin, Direct: 0.13 mg/dL (ref 0.00–0.40)
Total Protein: 6.6 g/dL (ref 6.0–8.5)

## 2023-09-28 LAB — CK: Total CK: 126 U/L (ref 32–182)

## 2023-09-29 ENCOUNTER — Other Ambulatory Visit: Payer: Self-pay | Admitting: Internal Medicine

## 2023-09-29 DIAGNOSIS — I10 Essential (primary) hypertension: Secondary | ICD-10-CM

## 2023-09-29 DIAGNOSIS — I6330 Cerebral infarction due to thrombosis of unspecified cerebral artery: Secondary | ICD-10-CM

## 2023-10-02 ENCOUNTER — Encounter: Payer: Self-pay | Admitting: Internal Medicine

## 2023-10-02 ENCOUNTER — Ambulatory Visit (INDEPENDENT_AMBULATORY_CARE_PROVIDER_SITE_OTHER): Payer: BC Managed Care – PPO | Admitting: Internal Medicine

## 2023-10-02 DIAGNOSIS — I6330 Cerebral infarction due to thrombosis of unspecified cerebral artery: Secondary | ICD-10-CM

## 2023-10-02 DIAGNOSIS — I10 Essential (primary) hypertension: Secondary | ICD-10-CM | POA: Diagnosis not present

## 2023-10-02 NOTE — Progress Notes (Signed)
Established Patient Office Visit  Subjective:  Patient ID: KATALEIA WHITLER, female    DOB: 1966/09/27  Age: 57 y.o. MRN: [id number]  Chief Complaint  Patient presents with   Follow-up    3 month follow up, discuss lab results.    No new complaints, recovered from recent episode of food poisoning. Also here for lab review and medication refills. LDL and TC well controlled on lab review. Triglycerides also satisfactory and LFTs returned to normal.  No other concerns at this time.   Past Medical History:  Diagnosis Date   Arthritis    Family history of breast cancer    Family history of lung cancer    History of kidney stones    History of stroke 04/18/2020   Hypercholesteremia    Hypertension    Personal history of radiation therapy    Stroke [facility])     Past Surgical History:  Procedure Laterality Date   ABDOMINAL HYSTERECTOMY     BREAST BIOPSY Left ?   neg   BREAST BIOPSY Left 06/17/2020   stereo bx, x-clip, positive   BREAST LUMPECTOMY Left 07/25/2020   CHOLECYSTECTOMY     MASTECTOMY, PARTIAL Left 08/12/2020   Procedure: RE-EXCISION OF INFERIOR MARGIN OF LEFT BREAST, DRAINAGE OF SEROMA OF LEFT BREAST;  Surgeon: Carolan Shiver, MD;  Location: [facility] ORS;  Service: General;  Laterality: Left;    Social History   Socioeconomic History   Marital status: Married    Spouse name: Not on file   Number of children: Not on file   Years of education: Not on file   Highest education level: Not on file  Occupational History   Not on file  Tobacco Use   Smoking status: Former    Current packs/day: 0.00    Types: Cigarettes    Quit date: 04/18/2020    Years since quitting: 3.4   Smokeless tobacco: Never  Substance and Sexual Activity   Alcohol use: Not Currently    Comment: occasionally    Drug use: Yes    Types: Marijuana   Sexual activity: Not on file  Other Topics Concern   Not on file  Social History Narrative   Not on file   Social Drivers of Health    Financial Resource Strain: Not on file  Food Insecurity: Not on file  Transportation Needs: Not on file  Physical Activity: Not on file  Stress: Not on file  Social Connections: Not on file  Intimate Partner Violence: Not on file    Family History  Problem Relation Age of Onset   Breast cancer Maternal Aunt    Breast cancer Maternal Aunt    Breast cancer Maternal Aunt    Lung cancer Maternal Uncle    Cancer Paternal Aunt        unk type   Lung cancer Maternal Aunt     Allergies  Allergen Reactions   Other Anaphylaxis and Swelling    Nuts    Penicillins Hives    Outpatient Medications Prior to Visit  Medication Sig   anastrozole (ARIMIDEX) 1 MG tablet Take 1 tablet by mouth once daily   aspirin EC 81 MG tablet Take 81 mg by mouth daily. Swallow whole.    calcium carbonate (OSCAL) 1500 (600 Ca) MG TABS tablet Take by mouth 2 (two) times daily with a meal.   cholecalciferol (VITAMIN D3) 25 MCG (1000 UNIT) tablet Take 1,000 Units by mouth daily.   famotidine (PEPCID) 20 MG tablet Take 1 tablet (  20 mg total) by mouth 2 (two) times daily.   hydrALAZINE (APRESOLINE) 100 MG tablet Take 100 mg by mouth 2 (two) times daily as needed.   Multiple Vitamins-Minerals (CENTRUM SILVER ADULT 50+ PO) Take 1 tablet by mouth daily.   Olmesartan-amLODIPine-HCTZ 40-10-25 MG TABS TAKE 1 TABLET BY MOUTH IN THE MORNING   rosuvastatin (CRESTOR) 40 MG tablet TAKE 1 TABLET BY MOUTH AT BEDTIME   hydrALAZINE (APRESOLINE) 100 MG tablet Take 1 tablet by mouth 3 (three) times daily. (Patient not taking: Reported on 10/02/2023)   nitrofurantoin (MACRODANTIN) 100 MG capsule Take 1 capsule (100 mg total) by mouth 2 (two) times daily. (Patient not taking: Reported on 10/02/2023)   ondansetron (ZOFRAN-ODT) 4 MG disintegrating tablet Take 1 tablet (4 mg total) by mouth every 8 (eight) hours as needed for nausea or vomiting. (Patient not taking: Reported on 10/02/2023)   [DISCONTINUED] doxycycline (VIBRA-TABS)  100 MG tablet Take 1 tablet (100 mg total) by mouth 2 (two) times daily. (Patient not taking: Reported on 10/02/2023)   Facility-Administered Medications Prior to Visit  Medication Dose Route Frequency Provider   potassium chloride SA (KLOR-CON M) CR tablet 20 mEq  20 mEq Oral Once Sherron Monday, MD    Review of Systems  Constitutional: Negative.   HENT: Negative.    Eyes: Negative.   Respiratory: Negative.    Cardiovascular: Negative.   Gastrointestinal: Negative.   Genitourinary: Negative.   Musculoskeletal:  Positive for back pain and joint pain.  Skin: Negative.   Neurological: Negative.   Endo/Heme/Allergies: Negative.        Objective:   BP 114/80   Pulse 90   Ht 5\' 4"  (1.626 m)   Wt 175 lb 12.8 oz (79.7 kg)   SpO2 100%   BMI 30.18 kg/m   Vitals:   10/02/23 0902  BP: 114/80  Pulse: 90  Height: 5\' 4"  (1.626 m)  Weight: 175 lb 12.8 oz (79.7 kg)  SpO2: 100%  BMI (Calculated): 30.16    Physical Exam Vitals reviewed.  Constitutional:      General: She is not in acute distress. HENT:     Head: Normocephalic.     Nose: Nose normal.     Mouth/Throat:     Mouth: Mucous membranes are moist.  Eyes:     Extraocular Movements: Extraocular movements intact.     Pupils: Pupils are equal, round, and reactive to light.  Cardiovascular:     Rate and Rhythm: Normal rate and regular rhythm.     Pulses: Normal pulses.     Heart sounds: Murmur heard.     Crescendo decrescendo systolic murmur is present with a grade of 3/6.  Pulmonary:     Effort: Pulmonary effort is normal.     Breath sounds: No rhonchi or rales.  Abdominal:     General: Abdomen is flat.     Palpations: There is no hepatomegaly, splenomegaly or mass.  Musculoskeletal:        General: Normal range of motion.     Cervical back: Normal range of motion. No tenderness.  Skin:    General: Skin is warm and dry.  Neurological:     General: No focal deficit present.     Mental Status: She is alert  and oriented to person, place, and time.     Cranial Nerves: No cranial nerve deficit.     Motor: No weakness.  Psychiatric:        Mood and Affect: Mood normal.  Behavior: Behavior normal.      No results found for any visits on 10/02/23.  Lipid Panel     Component Value Date/Time   CHOL 122 09/27/2023 0953   TRIG 76 09/27/2023 0953   HDL 44 09/27/2023 0953   CHOLHDL 2.8 09/27/2023 0953   CHOLHDL 5.4 04/20/2020 0438   VLDL 22 04/20/2020 0438   LDLCALC 63 09/27/2023 0953   LABVLDL 15 09/27/2023 0953       Latest Ref Rng & Units 09/27/2023    9:53 AM 09/13/2023    8:40 AM 07/10/2023    9:46 AM  Hepatic Function  Total Protein 6.0 - 8.5 g/dL 6.6  7.7  8.1   Albumin 3.8 - 4.9 g/dL 4.0  4.1  4.4   AST 0 - 40 IU/L 19  55  23   ALT 0 - 32 IU/L 15  81  17   Alk Phosphatase 44 - 121 IU/L 63  63  59   Total Bilirubin 0.0 - 1.2 mg/dL 0.3  1.0  0.7   Bilirubin, Direct 0.00 - 0.40 mg/dL 1.19          Assessment & Plan:  As per problem list  Problem List Items Addressed This Visit       Cardiovascular and Mediastinum   Essential (primary) hypertension     Nervous and Auditory   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   Total time spent: 20 minutes  Luna Fuse, MD  10/02/2023   This document may have been prepared by [facility] Voice Recognition software and as such may include unintentional dictation errors.

## 2023-10-13 ENCOUNTER — Other Ambulatory Visit: Payer: Self-pay | Admitting: Oncology

## 2023-10-28 ENCOUNTER — Encounter: Payer: Self-pay | Admitting: Family

## 2023-10-28 ENCOUNTER — Ambulatory Visit (INDEPENDENT_AMBULATORY_CARE_PROVIDER_SITE_OTHER): Payer: BC Managed Care – PPO | Admitting: Family

## 2023-10-28 VITALS — BP 116/78 | HR 97 | Ht 64.0 in | Wt 176.0 lb

## 2023-10-28 DIAGNOSIS — Z0000 Encounter for general adult medical examination without abnormal findings: Secondary | ICD-10-CM

## 2023-10-28 DIAGNOSIS — R300 Dysuria: Secondary | ICD-10-CM | POA: Diagnosis not present

## 2023-10-28 DIAGNOSIS — Z124 Encounter for screening for malignant neoplasm of cervix: Secondary | ICD-10-CM | POA: Diagnosis not present

## 2023-10-28 DIAGNOSIS — Z0130 Encounter for examination of blood pressure without abnormal findings: Secondary | ICD-10-CM

## 2023-10-28 LAB — POCT URINALYSIS DIPSTICK
Bilirubin, UA: NEGATIVE
Glucose, UA: NEGATIVE
Ketones, UA: NEGATIVE
Nitrite, UA: NEGATIVE
Protein, UA: POSITIVE — AB
Spec Grav, UA: 1.02 (ref 1.010–1.025)
Urobilinogen, UA: 0.2 U/dL
pH, UA: 7.5 (ref 5.0–8.0)

## 2023-10-28 NOTE — Progress Notes (Signed)
 Complete physical exam  Patient: Lindsay Guzman   DOB: December 16, 1965   58 y.o. Female  MRN: [id number]  Subjective:    Chief Complaint  Patient presents with   Annual Exam    CPE, PAP    Lindsay Guzman is a 58 y.o. female who presents today for a complete physical exam. She reports consuming a low fat diet. The patient has a physically strenuous job, but has no regular exercise apart from work.  She generally feels fairly well. She reports sleeping poorly. She does have additional problems to discuss today.   Has started back work, is standing a lot and has a lot of repetitive arm movements.  Having trouble standing for long periods of time as well.  When she gets home, she says it can take up to 2 hours for her to have the ability to get up and cook dinner for herself or do anything else that she needs to do.   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    Seems like every 2 months or so she gets sick.   She now seems to be considering disability as an option, as she has been having more and more trouble with work and pain after she gets done for the day.     Most recent depression screenings:    10/28/2023   10:14 AM  PHQ 2/9 Scores  PHQ - 2 Score 0  PHQ- 9 Score 5    Past Medical History:  Diagnosis Date   Arthritis    Family history of breast cancer    Family history of lung cancer    History of kidney stones    History of stroke 04/18/2020   Hypercholesteremia    Hypertension    Personal history of radiation therapy    Stroke [facility])     Past Surgical History:  Procedure Laterality Date   ABDOMINAL HYSTERECTOMY     BREAST BIOPSY Left ?   neg   BREAST BIOPSY Left 06/17/2020   stereo bx, x-clip, positive   BREAST LUMPECTOMY Left 07/25/2020   CHOLECYSTECTOMY     MASTECTOMY, PARTIAL Left 08/12/2020   Procedure: RE-EXCISION OF INFERIOR MARGIN OF LEFT BREAST, DRAINAGE OF SEROMA OF LEFT BREAST;  Surgeon: Rodolph Romano, MD;  Location: [facility] ORS;  Service: General;  Laterality: Left;    Family History  Problem Relation Age of Onset   Breast cancer Maternal Aunt    Breast cancer Maternal Aunt    Breast cancer Maternal Aunt    Lung cancer Maternal Uncle    Cancer Paternal Aunt        unk type   Lung cancer Maternal Aunt     Social History   Socioeconomic History   Marital status: Married    Spouse name: Not on file   Number of children: Not on file   Years of education: Not on file   Highest education level: Not on file  Occupational History   Not on file  Tobacco Use   Smoking status: Former    Current packs/day: 0.00    Types: Cigarettes    Quit date: 04/18/2020    Years since quitting: 3.5   Smokeless tobacco: Never  Substance and Sexual Activity   Alcohol use: Not Currently    Comment: occasionally    Drug use: Yes    Types: Marijuana   Sexual activity: Not on file  Other Topics Concern   Not  on file  Social History Narrative   Not on file   Social Drivers of Health   Financial Resource Strain: Not on file  Food Insecurity: Not on file  Transportation Needs: Not on file  Physical Activity: Not on file  Stress: Not on file  Social Connections: Not on file  Intimate Partner Violence: Not on file    Outpatient Medications Prior to Visit  Medication Sig   anastrozole  (ARIMIDEX ) 1 MG tablet Take 1 tablet by mouth once daily   aspirin  EC 81 MG tablet Take 81 mg by mouth daily. Swallow whole.    calcium  carbonate (OSCAL) 1500 (600 Ca) MG TABS tablet Take by mouth 2 (two) times daily with a meal.   cholecalciferol (VITAMIN D3) 25 MCG (1000 UNIT) tablet Take 1,000 Units by mouth daily.   famotidine  (PEPCID ) 20 MG tablet Take 1 tablet (20 mg total) by mouth 2 (two) times daily.   hydrALAZINE  (APRESOLINE ) 100 MG tablet Take 100 mg by mouth 2 (two) times daily as needed.   Multiple Vitamins-Minerals (CENTRUM SILVER ADULT 50+ PO) Take 1 tablet by mouth daily.    Olmesartan -amLODIPine -HCTZ 40-10-25 MG TABS TAKE 1 TABLET BY MOUTH IN THE MORNING   rosuvastatin  (CRESTOR ) 40 MG tablet TAKE 1 TABLET BY MOUTH AT BEDTIME   hydrALAZINE  (APRESOLINE ) 100 MG tablet Take 1 tablet by mouth 3 (three) times daily. (Patient not taking: Reported on 10/28/2023)   nitrofurantoin  (MACRODANTIN ) 100 MG capsule Take 1 capsule (100 mg total) by mouth 2 (two) times daily. (Patient not taking: Reported on 10/28/2023)   ondansetron  (ZOFRAN -ODT) 4 MG disintegrating tablet Take 1 tablet (4 mg total) by mouth every 8 (eight) hours as needed for nausea or vomiting. (Patient not taking: Reported on 10/28/2023)   Facility-Administered Medications Prior to Visit  Medication Dose Route Frequency Provider   potassium chloride  SA (KLOR-CON  M) CR tablet 20 mEq  20 mEq Oral Once Tejan-Sie, S Ahmed, MD    Review of Systems  All other systems reviewed and are negative.       Objective:     BP 116/78   Pulse 97   Ht 5' 4 (1.626 m)   Wt 176 lb (79.8 kg)   SpO2 97%   BMI 30.21 kg/m    Physical Exam Genitourinary:    Vagina: Vaginal discharge (thin, white) and tenderness present. No erythema.     Cervix: Normal.      Results for orders placed or performed in visit on 10/28/23  POCT Urinalysis Dipstick (81002)  Result Value Ref Range   Color, UA dark yellow    Clarity, UA cloudy    Glucose, UA Negative Negative   Bilirubin, UA neg    Ketones, UA neg    Spec Grav, UA 1.020 1.010 - 1.025   Blood, UA trace    pH, UA 7.5 5.0 - 8.0   Protein, UA Positive (A) Negative   Urobilinogen, UA 0.2 0.2 or 1.0 E.U./dL   Nitrite, UA neg    Leukocytes, UA Large (3+) (A) Negative   Appearance cloudy    Odor yes     Recent Results (from the past 2160 hours)  Lipase, blood     Status: None   Collection Time: 09/13/23  8:40 AM  Result Value Ref Range   Lipase 29 11 - 51 U/L    Comment: Performed at [facility], [address], [place], [place] [address]  Comprehensive  metabolic panel     Status: Abnormal   Collection  Time: 09/13/23  8:40 AM  Result Value Ref Range   Sodium 135 135 - 145 mmol/L   Potassium 3.0 (L) 3.5 - 5.1 mmol/L   Chloride 93 (L) 98 - 111 mmol/L   CO2 29 22 - 32 mmol/L   Glucose, Bld 102 (H) 70 - 99 mg/dL    Comment: Glucose reference range applies only to samples taken after fasting for at least 8 hours.   BUN 27 (H) 6 - 20 mg/dL   Creatinine, Ser 8.73 (H) 0.44 - 1.00 mg/dL   Calcium  9.2 8.9 - 10.3 mg/dL   Total Protein 7.7 6.5 - 8.1 g/dL   Albumin 4.1 3.5 - 5.0 g/dL   AST 55 (H) 15 - 41 U/L   ALT 81 (H) 0 - 44 U/L   Alkaline Phosphatase 63 38 - 126 U/L   Total Bilirubin 1.0 <1.2 mg/dL   GFR, Estimated 49 (L) >60 mL/min    Comment: (NOTE) Calculated using the CKD-EPI Creatinine Equation (2021)    Anion gap 13 5 - 15    Comment: Performed at [facility], [address] [place], [place] [address]  CBC     Status: Abnormal   Collection Time: 09/13/23  8:40 AM  Result Value Ref Range   WBC 9.1 4.0 - 10.5 K/uL   RBC 5.41 (H) 3.87 - 5.11 MIL/uL   Hemoglobin 15.1 (H) 12.0 - 15.0 g/dL   HCT 54.6 63.9 - 53.9 %   MCV 83.7 80.0 - 100.0 fL   MCH 27.9 26.0 - 34.0 pg   MCHC 33.3 30.0 - 36.0 g/dL   RDW 87.0 88.4 - 84.4 %   Platelets 286 150 - 400 K/uL   nRBC 0.0 0.0 - 0.2 %    Comment: Performed at [facility], [address] [place], [place] [address]  Urinalysis, Routine w reflex microscopic -Urine, Clean Catch     Status: Abnormal   Collection Time: 09/13/23  8:40 AM  Result Value Ref Range   Color, Urine AMBER (A) YELLOW    Comment: BIOCHEMICALS MAY BE AFFECTED BY COLOR   APPearance TURBID (A) CLEAR   Specific Gravity, Urine 1.023 1.005 - 1.030   pH 5.0 5.0 - 8.0   Glucose, UA NEGATIVE NEGATIVE mg/dL   Hgb urine dipstick MODERATE (A) NEGATIVE   Bilirubin Urine NEGATIVE NEGATIVE   Ketones, ur 5 (A) NEGATIVE mg/dL   Protein, ur 899 (A) NEGATIVE mg/dL   Nitrite NEGATIVE NEGATIVE   Leukocytes,Ua LARGE  (A) NEGATIVE   RBC / HPF >50 0 - 5 RBC/hpf   WBC, UA >50 0 - 5 WBC/hpf   Bacteria, UA MANY (A) NONE SEEN   Squamous Epithelial / HPF 21-50 0 - 5 /HPF   WBC Clumps PRESENT    Mucus PRESENT     Comment: Performed at [facility], [address] [place], [place] [address]  Resp panel by RT-PCR (RSV, Flu A&B, Covid) Anterior Nasal Swab     Status: None   Collection Time: 09/13/23  8:40 AM   Specimen: Anterior Nasal Swab  Result Value Ref Range   SARS Coronavirus 2 by RT PCR NEGATIVE NEGATIVE    Comment: (NOTE) SARS-CoV-2 target nucleic acids are NOT DETECTED.  The SARS-CoV-2 RNA is generally detectable in upper respiratory specimens during the acute phase of infection. The lowest concentration of SARS-CoV-2 viral copies this assay can detect is 138 copies/mL. A negative result does not preclude SARS-Cov-2 infection and should not be used as  the sole basis for treatment or other patient management decisions. A negative result may occur with  improper specimen collection/handling, submission of specimen other than nasopharyngeal swab, presence of viral mutation(s) within the areas targeted by this assay, and inadequate number of viral copies(<138 copies/mL). A negative result must be combined with clinical observations, patient history, and epidemiological information. The expected result is Negative.  Fact Sheet for Patients:  bloggercourse.com  Fact Sheet for Healthcare Providers:  seriousbroker.it  This test is no t yet approved or cleared by the [place]  FDA and  has been authorized for detection and/or diagnosis of SARS-CoV-2 by FDA under an Emergency Use Authorization (EUA). This EUA will remain  in effect (meaning this test can be used) for the duration of the COVID-19 declaration under Section 564(b)(1) of the Act, 21 U.S.C.section 360bbb-3(b)(1), unless the authorization is terminated  or revoked sooner.        Influenza A by PCR NEGATIVE NEGATIVE   Influenza B by PCR NEGATIVE NEGATIVE    Comment: (NOTE) The Xpert Xpress SARS-CoV-2/FLU/RSV plus assay is intended as an aid in the diagnosis of influenza from Nasopharyngeal swab specimens and should not be used as a sole basis for treatment. Nasal washings and aspirates are unacceptable for Xpert Xpress SARS-CoV-2/FLU/RSV testing.  Fact Sheet for Patients: bloggercourse.com  Fact Sheet for Healthcare Providers: seriousbroker.it  This test is not yet approved or cleared by the [place]  FDA and has been authorized for detection and/or diagnosis of SARS-CoV-2 by FDA under an Emergency Use Authorization (EUA). This EUA will remain in effect (meaning this test can be used) for the duration of the COVID-19 declaration under Section 564(b)(1) of the Act, 21 U.S.C. section 360bbb-3(b)(1), unless the authorization is terminated or revoked.     Resp Syncytial Virus by PCR NEGATIVE NEGATIVE    Comment: (NOTE) Fact Sheet for Patients: bloggercourse.com  Fact Sheet for Healthcare Providers: seriousbroker.it  This test is not yet approved or cleared by the [place]  FDA and has been authorized for detection and/or diagnosis of SARS-CoV-2 by FDA under an Emergency Use Authorization (EUA). This EUA will remain in effect (meaning this test can be used) for the duration of the COVID-19 declaration under Section 564(b)(1) of the Act, 21 U.S.C. section 360bbb-3(b)(1), unless the authorization is terminated or revoked.  Performed at [facility], [address] [place], [place] [address]   Lipid panel     Status: None   Collection Time: 09/27/23  9:53 AM  Result Value Ref Range   Cholesterol, Total 122 100 - 199 mg/dL   Triglycerides 76 0 - 149 mg/dL   HDL 44 >60 mg/dL   VLDL Cholesterol Cal 15 5 - 40 mg/dL   LDL Chol  Calc (NIH) 63 0 - 99 mg/dL   Chol/HDL Ratio 2.8 0.0 - 4.4 ratio    Comment:                                   T. Chol/HDL Ratio                                             Men  Women  1/2 Avg.Risk  3.4    3.3                                   Avg.Risk  5.0    4.4                                2X Avg.Risk  9.6    7.1                                3X Avg.Risk 23.4   11.0   Hepatic function panel     Status: None   Collection Time: 09/27/23  9:53 AM  Result Value Ref Range   Total Protein 6.6 6.0 - 8.5 g/dL   Albumin 4.0 3.8 - 4.9 g/dL   Bilirubin Total 0.3 0.0 - 1.2 mg/dL   Bilirubin, Direct 9.86 0.00 - 0.40 mg/dL   Alkaline Phosphatase 63 44 - 121 IU/L   AST 19 0 - 40 IU/L   ALT 15 0 - 32 IU/L  CK     Status: None   Collection Time: 09/27/23  9:53 AM  Result Value Ref Range   Total CK 126 32 - 182 U/L  POCT Urinalysis Dipstick (18997)     Status: Abnormal   Collection Time: 10/28/23 10:23 AM  Result Value Ref Range   Color, UA dark yellow    Clarity, UA cloudy    Glucose, UA Negative Negative   Bilirubin, UA neg    Ketones, UA neg    Spec Grav, UA 1.020 1.010 - 1.025   Blood, UA trace    pH, UA 7.5 5.0 - 8.0   Protein, UA Positive (A) Negative   Urobilinogen, UA 0.2 0.2 or 1.0 E.U./dL   Nitrite, UA neg    Leukocytes, UA Large (3+) (A) Negative   Appearance cloudy    Odor yes         Assessment & Plan:    Routine Health Maintenance and Physical Exam   There is no immunization history on file for this patient.  Health Maintenance  Topic Date Due   COVID-19 Vaccine (1) Never done   Hepatitis C Screening  Never done   DTaP/Tdap/Td (1 - Tdap) Never done   Zoster Vaccines- Shingrix (1 of 2) Never done   Cervical Cancer Screening (HPV/Pap Cotest)  Never done   INFLUENZA VACCINE  06/28/2024 (Originally 05/16/2023)   MAMMOGRAM  06/18/2025   Colonoscopy  11/17/2031   HIV Screening  Completed   HPV VACCINES  Aged Out    Discussed  health benefits of physical activity, and encouraged her to engage in regular exercise appropriate for her age and condition. Problem List Items Addressed This Visit   None Visit Diagnoses       Encounter for screening for cervical cancer    -  Primary   Pap obtained in office today. Sending to [facility] - will call patient with results.   Relevant Orders   IGP, Aptima HPV, rfx 16/18,45     Dysuria       Relevant Orders   POCT Urinalysis Dipstick (18997) (Completed)      Return as previously scheduled with Dr. Albina.     ALAN CHRISTELLA ARRANT, FNP  10/28/2023   This document may have been prepared by [employer]  Voice Recognition software and as such may include unintentional dictation errors.

## 2023-10-29 LAB — URINALYSIS, ROUTINE W REFLEX MICROSCOPIC
Bilirubin, UA: NEGATIVE
Glucose, UA: NEGATIVE
Ketones, UA: NEGATIVE
Nitrite, UA: POSITIVE — AB
Specific Gravity, UA: 1.019 (ref 1.005–1.030)
Urobilinogen, Ur: 0.2 mg/dL (ref 0.2–1.0)
pH, UA: 7.5 (ref 5.0–7.5)

## 2023-10-29 LAB — MICROSCOPIC EXAMINATION
Casts: NONE SEEN /[LPF]
WBC, UA: >30 /[HPF] — AB (ref 0–5)

## 2023-10-31 LAB — IGP, APTIMA HPV, RFX 16/18,45
HPV Aptima: NEGATIVE
PAP Smear Comment: 0

## 2023-11-01 LAB — URINE CULTURE

## 2023-11-02 ENCOUNTER — Encounter: Payer: Self-pay | Admitting: Family

## 2023-11-02 MED ORDER — NITROFURANTOIN MACROCRYSTAL 100 MG PO CAPS: 100.0000 mg | ORAL_CAPSULE | Freq: Two times a day (BID) | ORAL | 0 refills | Status: DC

## 2023-11-12 NOTE — Progress Notes (Signed)
Patient notified

## 2023-11-13 ENCOUNTER — Encounter: Payer: Self-pay | Admitting: Radiation Oncology

## 2023-11-13 ENCOUNTER — Ambulatory Visit
Admission: RE | Admit: 2023-11-13 | Discharge: 2023-11-13 | Disposition: A | Discharge status: Home / Self Care | Payer: BC Managed Care – PPO | Source: Ambulatory Visit | Attending: Radiation Oncology | Admitting: Radiation Oncology

## 2023-11-13 VITALS — BP 117/81 | HR 80 | Temp 98.3°F | Resp 16 | Wt 175.0 lb

## 2023-11-13 DIAGNOSIS — Z79811 Long term (current) use of aromatase inhibitors: Secondary | ICD-10-CM | POA: Insufficient documentation

## 2023-11-13 DIAGNOSIS — Z170 Estrogen receptor positive status [ER+]: Secondary | ICD-10-CM | POA: Insufficient documentation

## 2023-11-13 DIAGNOSIS — Z923 Personal history of irradiation: Secondary | ICD-10-CM | POA: Diagnosis not present

## 2023-11-13 DIAGNOSIS — D0512 Intraductal carcinoma in situ of left breast: Secondary | ICD-10-CM | POA: Insufficient documentation

## 2023-11-13 NOTE — Progress Notes (Signed)
Radiation Oncology Follow up Note  Name: Lindsay Guzman   Date:   11/13/2023 MRN:  [id number] DOB: 1966-06-06    This 58 y.o. 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  She still has occasional pain in her left breast axillary area which I assured her is from scar tissue from her prior surgery.  She otherwise specifically denies breast tenderness cough or bone pain..  She had mammograms back in September which I have reviewed were BI-RADS 2 benign.  She is currently on Arimidex tolerating it well without side effect.  COMPLICATIONS OF TREATMENT: none  FOLLOW UP COMPLIANCE: keeps appointments   PHYSICAL EXAM:  BP 117/81   Pulse 80   Temp 98.3 F (36.8 C) (Temporal)   Resp 16   Wt 175 lb (79.4 kg)   BMI 30.04 kg/m  Lungs are clear to A&P cardiac examination essentially unremarkable with regular rate and rhythm. No dominant mass or nodularity is noted in either breast in 2 positions examined. Incision is well-healed. No axillary or supraclavicular adenopathy is appreciated. Cosmetic result is excellent.  Well-developed well-nourished patient in NAD. HEENT reveals PERLA, EOMI, discs not visualized.  Oral cavity is clear. No oral mucosal lesions are identified. Neck is clear without evidence of cervical or supraclavicular adenopathy. Lungs are clear to A&P. Cardiac examination is essentially unremarkable with regular rate and rhythm without murmur rub or thrill. Abdomen is benign with no organomegaly or masses noted. Motor sensory and DTR levels are equal and symmetric in the upper and lower extremities. Cranial nerves II through XII are grossly intact. Proprioception is intact. No  peripheral adenopathy or edema is identified. No motor or sensory levels are noted. Crude visual fields are within normal range.  RADIOLOGY RESULTS: Mammograms reviewed compatible with above-stated findings  PLAN: Present time patient is doing well now at over 3 years with no evidence of disease.  I will turn follow-up care over to medical oncology.  I would be happy to reevaluate the patient in time the future should that be indicated.  She continues on Arimidex without side effect.  Patient is to call with any concerns.  I would like to take this opportunity to thank you for allowing me to participate in the care of your patient.Carmina Miller, MD

## 2023-12-20 ENCOUNTER — Other Ambulatory Visit

## 2023-12-20 ENCOUNTER — Other Ambulatory Visit: Payer: Self-pay | Admitting: Internal Medicine

## 2023-12-21 LAB — COMPREHENSIVE METABOLIC PANEL
ALT: 12 IU/L (ref 0–32)
AST: 19 IU/L (ref 0–40)
Albumin: 4.3 g/dL (ref 3.8–4.9)
Alkaline Phosphatase: 67 IU/L (ref 44–121)
BUN/Creatinine Ratio: 14 (ref 9–23)
BUN: 13 mg/dL (ref 6–24)
Bilirubin Total: 0.3 mg/dL (ref 0.0–1.2)
CO2: 27 mmol/L (ref 20–29)
Calcium: 10.2 mg/dL (ref 8.7–10.2)
Chloride: 101 mmol/L (ref 96–106)
Creatinine, Ser: 0.96 mg/dL (ref 0.57–1.00)
Globulin, Total: 2.8 g/dL (ref 1.5–4.5)
Glucose: 83 mg/dL (ref 70–99)
Potassium: 3.9 mmol/L (ref 3.5–5.2)
Sodium: 141 mmol/L (ref 134–144)
Total Protein: 7.1 g/dL (ref 6.0–8.5)
eGFR: 68 mL/min/{1.73_m2} (ref >59)

## 2023-12-21 LAB — LIPID PANEL
Chol/HDL Ratio: 2.9 ratio (ref 0.0–4.4)
Cholesterol, Total: 125 mg/dL (ref 100–199)
HDL: 43 mg/dL (ref >39)
LDL Chol Calc (NIH): 66 mg/dL (ref 0–99)
Triglycerides: 80 mg/dL (ref 0–149)
VLDL Cholesterol Cal: 16 mg/dL (ref 5–40)

## 2023-12-22 ENCOUNTER — Other Ambulatory Visit: Payer: Self-pay | Admitting: Internal Medicine

## 2023-12-22 DIAGNOSIS — I6330 Cerebral infarction due to thrombosis of unspecified cerebral artery: Secondary | ICD-10-CM

## 2023-12-22 DIAGNOSIS — I10 Essential (primary) hypertension: Secondary | ICD-10-CM

## 2023-12-23 ENCOUNTER — Ambulatory Visit: Payer: BC Managed Care – PPO | Admitting: Internal Medicine

## 2023-12-24 ENCOUNTER — Encounter: Payer: Self-pay | Admitting: Internal Medicine

## 2023-12-30 ENCOUNTER — Ambulatory Visit: Payer: BC Managed Care – PPO | Admitting: Family

## 2023-12-30 ENCOUNTER — Encounter: Payer: Self-pay | Admitting: Family

## 2023-12-30 VITALS — BP 118/78 | HR 88 | Ht 64.0 in | Wt 177.2 lb

## 2023-12-30 DIAGNOSIS — Z124 Encounter for screening for malignant neoplasm of cervix: Secondary | ICD-10-CM | POA: Diagnosis not present

## 2023-12-30 DIAGNOSIS — Z1151 Encounter for screening for human papillomavirus (HPV): Secondary | ICD-10-CM

## 2023-12-30 DIAGNOSIS — Z1272 Encounter for screening for malignant neoplasm of vagina: Secondary | ICD-10-CM | POA: Diagnosis not present

## 2023-12-30 DIAGNOSIS — Z0001 Encounter for general adult medical examination with abnormal findings: Secondary | ICD-10-CM | POA: Diagnosis not present

## 2023-12-30 DIAGNOSIS — Z01419 Encounter for gynecological examination (general) (routine) without abnormal findings: Secondary | ICD-10-CM

## 2023-12-30 DIAGNOSIS — Z0130 Encounter for examination of blood pressure without abnormal findings: Secondary | ICD-10-CM

## 2023-12-30 NOTE — Progress Notes (Signed)
 Complete physical exam  Patient: Lindsay Guzman   DOB: [birth year]   58 y.o. Female  MRN: [id number]  Subjective:    Chief Complaint  Patient presents with   Follow-up    PAP    Lindsay Guzman is a 58 y.o. female who presents today for a complete physical exam. She reports consuming a general diet.  She generally feels fairly well. She reports sleeping fairly well. She does not have additional problems to discuss today.    Most recent depression screenings:    10/28/2023   10:14 AM  PHQ 2/9 Scores  PHQ - 2 Score 0  PHQ- 9 Score 5     Past Medical History:  Diagnosis Date   Arthritis    Family history of breast cancer    Family history of lung cancer    History of kidney stones    History of stroke 04/18/2020   Hypercholesteremia    Hypertension    Personal history of radiation therapy    Stroke [facility])     Past Surgical History:  Procedure Laterality Date   ABDOMINAL HYSTERECTOMY     BREAST BIOPSY Left ?   neg   BREAST BIOPSY Left 06/17/2020   stereo bx, x-clip, positive   BREAST LUMPECTOMY Left 07/25/2020   CHOLECYSTECTOMY     MASTECTOMY, PARTIAL Left 08/12/2020   Procedure: RE-EXCISION OF INFERIOR MARGIN OF LEFT BREAST, DRAINAGE OF SEROMA OF LEFT BREAST;  Surgeon: Carolan Shiver, MD;  Location: [facility] ORS;  Service: General;  Laterality: Left;    Family History  Problem Relation Age of Onset   Breast cancer Maternal Aunt    Breast cancer Maternal Aunt    Breast cancer Maternal Aunt    Lung cancer Maternal Uncle    Cancer Paternal Aunt        unk type   Lung cancer Maternal Aunt     Social History   Socioeconomic History   Marital status: Married    Spouse name: Not on file   Number of children: Not on file   Years of education: Not on file   Highest education level: Not on file  Occupational History   Not on file  Tobacco Use   Smoking status: Former    Current packs/day: 0.00    Types: Cigarettes    Quit date: 04/18/2020    Years since  quitting: 3.7   Smokeless tobacco: Never  Substance and Sexual Activity   Alcohol use: Not Currently    Comment: occasionally    Drug use: Yes    Types: Marijuana   Sexual activity: Not on file  Other Topics Concern   Not on file  Social History Narrative   Not on file   Social Drivers of Health   Financial Resource Strain: Not on file  Food Insecurity: Not on file  Transportation Needs: Not on file  Physical Activity: Not on file  Stress: Not on file  Social Connections: Not on file  Intimate Partner Violence: Not on file    Outpatient Medications Prior to Visit  Medication Sig   anastrozole (ARIMIDEX) 1 MG tablet Take 1 tablet by mouth once daily   aspirin EC 81 MG tablet Take 81 mg by mouth daily. Swallow whole.    calcium carbonate (OSCAL) 1500 (600 Ca) MG TABS tablet Take by mouth 2 (two) times daily with a meal.   cholecalciferol (VITAMIN D3) 25 MCG (1000 UNIT) tablet Take 1,000 Units by mouth daily.   famotidine (PEPCID) 20  MG tablet Take 1 tablet (20 mg total) by mouth 2 (two) times daily.   hydrALAZINE (APRESOLINE) 100 MG tablet Take 100 mg by mouth 2 (two) times daily as needed.   Multiple Vitamins-Minerals (CENTRUM SILVER ADULT 50+ PO) Take 1 tablet by mouth daily.   Olmesartan-amLODIPine-HCTZ 40-10-25 MG TABS TAKE 1 TABLET BY MOUTH IN THE MORNING   rosuvastatin (CRESTOR) 40 MG tablet TAKE 1 TABLET BY MOUTH AT BEDTIME   nitrofurantoin (MACRODANTIN) 100 MG capsule Take 1 capsule (100 mg total) by mouth 2 (two) times daily. (Patient not taking: Reported on 12/30/2023)   Facility-Administered Medications Prior to Visit  Medication Dose Route Frequency Provider   potassium chloride SA (KLOR-CON M) CR tablet 20 mEq  20 mEq Oral Once Sherron Monday, MD    Review of Systems  All other systems reviewed and are negative.       Objective:     BP 118/78   Pulse 88   Ht 5\' 4"  (1.626 m)   Wt 177 lb 3.2 oz (80.4 kg)   SpO2 99%   BMI 30.42 kg/m    Physical  Exam Vitals and nursing note reviewed. Exam conducted with a chaperone present.  Constitutional:      Appearance: Normal appearance. She is normal weight.  HENT:     Head: Normocephalic.  Eyes:     Extraocular Movements: Extraocular movements intact.     Conjunctiva/sclera: Conjunctivae normal.     Pupils: Pupils are equal, round, and reactive to light.  Cardiovascular:     Rate and Rhythm: Normal rate.  Pulmonary:     Effort: Pulmonary effort is normal.  Genitourinary:    Exam position: Lithotomy position.     Vagina: Normal.     Cervix: Normal.  Neurological:     General: No focal deficit present.     Mental Status: She is alert and oriented to person, place, and time. Mental status is at baseline.  Psychiatric:        Mood and Affect: Mood normal.        Behavior: Behavior normal.        Thought Content: Thought content normal.        Judgment: Judgment normal.      No results found for any visits on 12/30/23.  Recent Results (from the past 2160 hours)  IGP, Aptima HPV, rfx 16/18,45     Status: None   Collection Time: 10/28/23 12:00 AM  Result Value Ref Range   DIAGNOSIS: Comment     Comment: UNSATISFACTORY FOR EVALUATION.   Specimen adequacy: Comment     Comment: Specimen processed and examined, but unsatisfactory for evaluation of epithelial abnormality because of insufficient cellularity.    Clinician Provided ICD10 Comment     Comment: Z12.4   Performed by: Comment     Comment: Hadassah Pais, Cytotechnologist (ASCP)   QC reviewed by: Comment     Comment: Elie Confer, Cytotechnologist (ASCP)   PAP Smear Comment .    Note: Comment     Comment: The Pap smear is a screening test designed to aid in the detection of premalignant and malignant conditions of the uterine cervix.  It is not a diagnostic procedure and should not be used as the sole means of detecting cervical cancer.  Both false-positive and false-negative reports do occur.    Test Methodology  CANCELED     Comment: The Thin Prep(R) Imager was unable to read this specimen.  Therefore a manual review was performed.  Result canceled by the ancillary.    HPV Aptima Negative Negative    Comment: This nucleic acid amplification test detects fourteen high-risk HPV types (16,18,31,33,35,39,45,51,52,56,58,59,66,68) without differentiation.    HPV Genotype Reflex Comment     Comment: Criteria not met, HPV Genotype not performed.  POCT Urinalysis Dipstick (16109)     Status: Abnormal   Collection Time: 10/28/23 10:23 AM  Result Value Ref Range   Color, UA dark yellow    Clarity, UA cloudy    Glucose, UA Negative Negative   Bilirubin, UA neg    Ketones, UA neg    Spec Grav, UA 1.020 1.010 - 1.025   Blood, UA trace    pH, UA 7.5 5.0 - 8.0   Protein, UA Positive (A) Negative   Urobilinogen, UA 0.2 0.2 or 1.0 E.U./dL   Nitrite, UA neg    Leukocytes, UA Large (3+) (A) Negative   Appearance cloudy    Odor yes   Urinalysis, Routine w reflex microscopic     Status: Abnormal   Collection Time: 10/28/23  1:00 PM  Result Value Ref Range   Specific Gravity, UA 1.019 1.005 - 1.030   pH, UA 7.5 5.0 - 7.5   Color, UA Yellow Yellow   Appearance Ur Turbid (A) Clear   Leukocytes,UA 3+ (A) Negative   Protein,UA 1+ (A) Negative/Trace   Glucose, UA Negative Negative   Ketones, UA Negative Negative   RBC, UA Trace (A) Negative   Bilirubin, UA Negative Negative   Urobilinogen, Ur 0.2 0.2 - 1.0 mg/dL   Nitrite, UA Positive (A) Negative   Microscopic Examination See below:     Comment: Microscopic was indicated and was performed.  Urine Culture     Status: Abnormal   Collection Time: 10/28/23  1:00 PM   Specimen: Urine, Clean Catch   UR  Result Value Ref Range   Urine Culture, Routine Final report (A)    Organism ID, Bacteria Escherichia coli (A)     Comment: Greater than 100,000 colony forming units per mL   Antimicrobial Susceptibility Comment     Comment:       ** S = Susceptible; I  = Intermediate; R = Resistant **                    P = Positive; N = Negative             MICS are expressed in micrograms per mL    Antibiotic                 RSLT#1    RSLT#2    RSLT#3    RSLT#4 Amoxicillin/Clavulanic Acid    S Ampicillin                     S Cefepime                       S Ceftriaxone                    S Cefuroxime                     I Ciprofloxacin                  S Ertapenem                      S Gentamicin  S Imipenem                       S Levofloxacin                   S Meropenem                      S Nitrofurantoin                 S Piperacillin/Tazobactam        S Tetracycline                   S Tobramycin                     S Trimethoprim/Sulfa             S   Microscopic Examination     Status: Abnormal   Collection Time: 10/28/23  1:00 PM  Result Value Ref Range   WBC, UA >30 (A) 0 - 5 /hpf   RBC, Urine 11-30 (A) 0 - 2 /hpf   Epithelial Cells (non renal) 0-10 0 - 10 /hpf   Casts None seen None seen /lpf   Bacteria, UA Many (A) None seen/Few  Comprehensive metabolic panel     Status: None   Collection Time: 12/20/23  9:46 AM  Result Value Ref Range   Glucose 83 70 - 99 mg/dL   BUN 13 6 - 24 mg/dL   Creatinine, Ser 1.61 0.57 - 1.00 mg/dL   eGFR 68 >09 UE/AVW/0.98   BUN/Creatinine Ratio 14 9 - 23   Sodium 141 134 - 144 mmol/L   Potassium 3.9 3.5 - 5.2 mmol/L   Chloride 101 96 - 106 mmol/L   CO2 27 20 - 29 mmol/L   Calcium 10.2 8.7 - 10.2 mg/dL   Total Protein 7.1 6.0 - 8.5 g/dL   Albumin 4.3 3.8 - 4.9 g/dL   Globulin, Total 2.8 1.5 - 4.5 g/dL   Bilirubin Total 0.3 0.0 - 1.2 mg/dL   Alkaline Phosphatase 67 44 - 121 IU/L   AST 19 0 - 40 IU/L   ALT 12 0 - 32 IU/L  Lipid panel     Status: None   Collection Time: 12/20/23  9:46 AM  Result Value Ref Range   Cholesterol, Total 125 100 - 199 mg/dL   Triglycerides 80 0 - 149 mg/dL   HDL 43 >11 mg/dL   VLDL Cholesterol Cal 16 5 - 40 mg/dL   LDL Chol Calc (NIH) 66  0 - 99 mg/dL   Chol/HDL Ratio 2.9 0.0 - 4.4 ratio    Comment:                                   T. Chol/HDL Ratio                                             Men  Women                               1/2 Avg.Risk  3.4    3.3  Avg.Risk  5.0    4.4                                2X Avg.Risk  9.6    7.1                                3X Avg.Risk 23.4   11.0         Assessment & Plan:    Routine Health Maintenance and Physical Exam   There is no immunization history on file for this patient.  Health Maintenance  Topic Date Due   COVID-19 Vaccine (1) Never done   Hepatitis C Screening  Never done   DTaP/Tdap/Td (1 - Tdap) Never done   Zoster Vaccines- Shingrix (1 of 2) Never done   INFLUENZA VACCINE  06/28/2024 (Originally 05/16/2023)   MAMMOGRAM  06/18/2025   Cervical Cancer Screening (HPV/Pap Cotest)  10/27/2028   Colonoscopy  11/17/2031   HIV Screening  Completed   HPV VACCINES  Aged Out    Discussed health benefits of physical activity, and encouraged her to engage in regular exercise appropriate for her age and condition.  Problem List Items Addressed This Visit   None Visit Diagnoses       Vaginal Pap smear    -  Primary   Relevant Orders   IGP, Aptima HPV     Screening for cervical cancer       Relevant Orders   IGP, Aptima HPV     Screening for human papillomavirus (HPV)       Relevant Orders   IGP, Aptima HPV     Well female exam with routine gynecological exam          Pap smear completed in office today.  Will call patient with results when available.   Return as previously scheduled with Dr. Ellsworth Lennox.     Miki Kins, FNP  12/30/2023   This document may have been prepared by Dragon Voice Recognition software and as such may include unintentional dictation errors.

## 2024-01-06 ENCOUNTER — Ambulatory Visit: Payer: BC Managed Care – PPO | Admitting: Family

## 2024-01-06 ENCOUNTER — Ambulatory Visit: Payer: BC Managed Care – PPO | Admitting: Internal Medicine

## 2024-01-08 ENCOUNTER — Encounter: Payer: Self-pay | Admitting: Oncology

## 2024-01-08 ENCOUNTER — Inpatient Hospital Stay (HOSPITAL_BASED_OUTPATIENT_CLINIC_OR_DEPARTMENT_OTHER): Payer: BC Managed Care – PPO | Admitting: Oncology

## 2024-01-08 ENCOUNTER — Inpatient Hospital Stay: Payer: BC Managed Care – PPO | Attending: Radiation Oncology

## 2024-01-08 VITALS — BP 141/89 | HR 74 | Temp 97.9°F | Resp 18 | Wt 177.0 lb

## 2024-01-08 DIAGNOSIS — R7989 Other specified abnormal findings of blood chemistry: Secondary | ICD-10-CM | POA: Insufficient documentation

## 2024-01-08 DIAGNOSIS — M810 Age-related osteoporosis without current pathological fracture: Secondary | ICD-10-CM | POA: Diagnosis not present

## 2024-01-08 DIAGNOSIS — Z880 Allergy status to penicillin: Secondary | ICD-10-CM | POA: Insufficient documentation

## 2024-01-08 DIAGNOSIS — Z8673 Personal history of transient ischemic attack (TIA), and cerebral infarction without residual deficits: Secondary | ICD-10-CM | POA: Insufficient documentation

## 2024-01-08 DIAGNOSIS — Z79811 Long term (current) use of aromatase inhibitors: Secondary | ICD-10-CM | POA: Insufficient documentation

## 2024-01-08 DIAGNOSIS — Z79899 Other long term (current) drug therapy: Secondary | ICD-10-CM | POA: Diagnosis not present

## 2024-01-08 DIAGNOSIS — Z90710 Acquired absence of both cervix and uterus: Secondary | ICD-10-CM | POA: Insufficient documentation

## 2024-01-08 DIAGNOSIS — Z923 Personal history of irradiation: Secondary | ICD-10-CM | POA: Insufficient documentation

## 2024-01-08 DIAGNOSIS — Z803 Family history of malignant neoplasm of breast: Secondary | ICD-10-CM | POA: Diagnosis not present

## 2024-01-08 DIAGNOSIS — Z87891 Personal history of nicotine dependence: Secondary | ICD-10-CM | POA: Diagnosis not present

## 2024-01-08 DIAGNOSIS — Z87442 Personal history of urinary calculi: Secondary | ICD-10-CM | POA: Diagnosis not present

## 2024-01-08 DIAGNOSIS — Z801 Family history of malignant neoplasm of trachea, bronchus and lung: Secondary | ICD-10-CM | POA: Diagnosis not present

## 2024-01-08 DIAGNOSIS — D0512 Intraductal carcinoma in situ of left breast: Secondary | ICD-10-CM | POA: Insufficient documentation

## 2024-01-08 DIAGNOSIS — I10 Essential (primary) hypertension: Secondary | ICD-10-CM | POA: Diagnosis not present

## 2024-01-08 DIAGNOSIS — Z9049 Acquired absence of other specified parts of digestive tract: Secondary | ICD-10-CM | POA: Diagnosis not present

## 2024-01-08 LAB — IGP, APTIMA HPV
HPV Aptima: NEGATIVE
PAP Smear Comment: 0

## 2024-01-08 LAB — CBC WITH DIFFERENTIAL (CANCER CENTER ONLY)
Abs Immature Granulocytes: 0.02 10*3/uL (ref 0.00–0.07)
Basophils Absolute: 0 10*3/uL (ref 0.0–0.1)
Basophils Relative: 1 %
Eosinophils Absolute: 0.3 10*3/uL (ref 0.0–0.5)
Eosinophils Relative: 4 %
HCT: 42.2 % (ref 36.0–46.0)
Hemoglobin: 13.6 g/dL (ref 12.0–15.0)
Immature Granulocytes: 0 %
Lymphocytes Relative: 39 %
Lymphs Abs: 2.3 10*3/uL (ref 0.7–4.0)
MCH: 27.7 pg (ref 26.0–34.0)
MCHC: 32.2 g/dL (ref 30.0–36.0)
MCV: 85.9 fL (ref 80.0–100.0)
Monocytes Absolute: 0.4 10*3/uL (ref 0.1–1.0)
Monocytes Relative: 6 %
Neutro Abs: 3 10*3/uL (ref 1.7–7.7)
Neutrophils Relative %: 50 %
Platelet Count: 325 10*3/uL (ref 150–400)
RBC: 4.91 MIL/uL (ref 3.87–5.11)
RDW: 13.3 % (ref 11.5–15.5)
WBC Count: 6 10*3/uL (ref 4.0–10.5)
nRBC: 0 % (ref 0.0–0.2)

## 2024-01-08 LAB — CMP (CANCER CENTER ONLY)
ALT: 15 U/L (ref 0–44)
AST: 24 U/L (ref 15–41)
Albumin: 4.1 g/dL (ref 3.5–5.0)
Alkaline Phosphatase: 58 U/L (ref 38–126)
Anion gap: 14 (ref 5–15)
BUN: 14 mg/dL (ref 6–20)
CO2: 25 mmol/L (ref 22–32)
Calcium: 9.6 mg/dL (ref 8.9–10.3)
Chloride: 100 mmol/L (ref 98–111)
Creatinine: 1.12 mg/dL — ABNORMAL HIGH (ref 0.44–1.00)
GFR, Estimated: 57 mL/min — ABNORMAL LOW (ref >60)
Glucose, Bld: 88 mg/dL (ref 70–99)
Potassium: 3.5 mmol/L (ref 3.5–5.1)
Sodium: 139 mmol/L (ref 135–145)
Total Bilirubin: 0.4 mg/dL (ref 0.0–1.2)
Total Protein: 8.1 g/dL (ref 6.5–8.1)

## 2024-01-08 MED ORDER — ANASTROZOLE 1 MG PO TABS: 1.0000 mg | ORAL_TABLET | Freq: Every day | ORAL | 1 refills | Status: DC

## 2024-01-08 NOTE — Assessment & Plan Note (Signed)
 05/18/2021 bone density showed left femoral neck T- 2.6.  She previously declined bisphosphonate She also is not interested in switching to Tamoxifen Repeat DEXA. She is open to bisphosphonate now, I recommend her to obtain dental clearance.  Recommend patient to take calcium and vitamin D supplementation

## 2024-01-08 NOTE — Progress Notes (Signed)
 Hematology/Oncology Progress note [phone] [phone]      Patient Care Team: Sherron Monday, MD as PCP - General (Internal Medicine) [name], MD as Consulting Physician (Oncology) Carolan Shiver, MD as Consulting Physician (General Surgery) Carmina Miller, MD as Referring Physician (Radiation Oncology) Scarlett Presto, RN (Inactive) as Oncology Nurse Navigator (Oncology)  ASSESSMENT & PLAN:   Cancer Staging  Ductal carcinoma in situ (DCIS) of left breast Staging form: Breast, AJCC 8th Edition - Clinical stage from 07/27/2020: Stage 0 (cTis (DCIS), cN0, cM0, G3, ER+, PR: Not Assessed, HER2: Not Assessed) - Signed by [name], MD on 11/08/2022   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  pTis pN0.  Labs reviewed and discussed with patient. Continue Arimidex 1 mg daily, plan 5 years-Jan 2027 .  Refills were sent to pharmacy.  Annual Mammogram surveillance - Sept 2025  Osteoporosis 05/18/2021 bone density showed left femoral neck T- 2.6.  She previously declined bisphosphonate She also is not interested in switching to Tamoxifen Repeat DEXA. She is open to bisphosphonate now, I recommend her to obtain dental clearance.  Recommend patient to take calcium and vitamin D supplementation    Orders Placed This Encounter  Procedures   DG Bone Density    Standing Status:   Future    Expected Date:   01/09/2024    Expiration Date:   01/07/2025    Reason for Exam (SYMPTOM  OR DIAGNOSIS REQUIRED):   hx DCIS    Is patient pregnant?:   No    Preferred imaging location?:   [facility]   MM 3D SCREENING MAMMOGRAM BILATERAL BREAST    Standing Status:   Future    Expected Date:   07/10/2024    Expiration Date:   01/07/2025    Reason for Exam (SYMPTOM  OR DIAGNOSIS REQUIRED):   DCIS left breast    Preferred imaging  location?:   [facility]    Is the patient pregnant?:   No   CMP (Cancer Center only)    Standing Status:   Future    Expected Date:   04/09/2024    Expiration Date:   01/07/2025   CBC with Differential (Cancer Center Only)    Standing Status:   Future    Expected Date:   04/09/2024    Expiration Date:   01/07/2025   Follow up in 3 months.  . All questions were answered. The patient knows to call the clinic with any problems, questions or concerns.  [name], MD, PhD [facility] Hematology Oncology 01/08/2024   CHIEF COMPLAINTS/REASON FOR VISIT:  Follow up for breast cancer DCIS  HISTORY OF PRESENTING ILLNESS:  Lindsay Guzman is a  58 y.o.  female with PMH listed below who presents for follow-up of left breast DCIS. Oncology history listed as below. Oncology History  Ductal carcinoma in situ (DCIS) of left breast  06/09/2020 Imaging   06/09/2020 bilateral diagnostic mammogram showed Indeterminate medial left breast calcifications spanning 2.3 cm.  No other suspicious findings.  Medial right breast mass has been stable since 2012.   06/22/2020 Initial Diagnosis   Ductal carcinoma in situ (DCIS) of left breast  -06/17/2020 patient underwent stereotactic biopsy of left breast calcifications.  Biopsy showed high-grade DCIS with comedonecrosis.  Associated with calcification. -07/27/2020, patient proceed with left breast lumpectomy- Dr.Cintron,  Due to the high-grade DCIS, patient  opted to do sentinel lymph node biopsy at the same time of lumpectomy surgery.Pathology showed left breast high-grade DCIS, with comedonecrosis, negative for invasive component.  No lymph node involvement. Closest DCIS margin was 0.5 mm.  ER positive 08/12/20 re-excision of inferior margin.pathology showed negative for residual DCIS, or invasive carcinoma.      07/27/2020 Cancer Staging   Staging form: Breast, AJCC 8th Edition - Clinical stage from 07/27/2020: Stage 0 (cTis (DCIS), cN0, cM0, G3, ER+, PR: Not  Assessed, HER2: Not Assessed) - Signed by [name], MD on 11/08/2022 Stage prefix: Initial diagnosis Nuclear grade: G3 Histologic grading system: 3 grade system   09/21/2020 Genetic Testing   [facility] CustomNext+RNA cancer panel found no pathogenic mutations.    10/21/2020 -  Radiation Therapy   Adjuvant Radiation   10/2020 -  Anti-estrogen oral therapy   Started on Arimidex   06/12/2021 Mammogram   Bilateral diagnostic mammogram showed Interval postoperative changes in the lower inner left breast at posterior depth. No mammographic findings of malignancy in either breast.   06/19/2023 Mammogram   Bilateral screening mammogram showed No mammographic evidence of malignancy.     INTERVAL HISTORY NEOLA WORRALL is a 58 y.o. female who has above history reviewed by me today presents for follow up visit for management of left breast high-grade DCIS Patient tolerates Arimidex 1 mg daily.  Manageable side effect.     Review of Systems  Constitutional:  Negative for appetite change, chills, fatigue and fever.  HENT:   Negative for hearing loss and voice change.   Eyes:  Negative for eye problems.  Respiratory:  Negative for chest tightness, cough and shortness of breath.   Cardiovascular:  Negative for chest pain.  Gastrointestinal:  Negative for abdominal distention, abdominal pain and blood in stool.  Endocrine: Negative for hot flashes.  Genitourinary:  Negative for difficulty urinating and frequency.   Musculoskeletal:  Positive for arthralgias.  Skin:  Negative for itching and rash.  Neurological:  Negative for extremity weakness.  Hematological:  Negative for adenopathy.  Psychiatric/Behavioral:  Negative for confusion.      MEDICAL HISTORY:  Past Medical History:  Diagnosis Date   Arthritis    Family history of breast cancer    Family history of lung cancer    History of kidney stones    History of stroke 04/18/2020   Hypercholesteremia    Hypertension    Personal history  of radiation therapy    Stroke [facility])     SURGICAL HISTORY: Past Surgical History:  Procedure Laterality Date   ABDOMINAL HYSTERECTOMY     BREAST BIOPSY Left ?   neg   BREAST BIOPSY Left 06/17/2020   stereo bx, x-clip, positive   BREAST LUMPECTOMY Left 07/25/2020   CHOLECYSTECTOMY     MASTECTOMY, PARTIAL Left 08/12/2020   Procedure: RE-EXCISION OF INFERIOR MARGIN OF LEFT BREAST, DRAINAGE OF SEROMA OF LEFT BREAST;  Surgeon: Carolan Shiver, MD;  Location: [facility] ORS;  Service: General;  Laterality: Left;    SOCIAL HISTORY: Social History   Socioeconomic History   Marital status: Married    Spouse name: Not on file   Number of children: Not on file   Years of education: Not on file   Highest education level: Not on file  Occupational History   Not on file  Tobacco Use   Smoking status: Former    Current packs/day: 0.00    Types: Cigarettes    Quit date: 04/18/2020    Years since quitting:  3.7   Smokeless tobacco: Never  Substance and Sexual Activity   Alcohol use: Not Currently    Comment: occasionally    Drug use: Yes    Types: Marijuana   Sexual activity: Not on file  Other Topics Concern   Not on file  Social History Narrative   Not on file   Social Drivers of Health   Financial Resource Strain: Not on file  Food Insecurity: Not on file  Transportation Needs: Not on file  Physical Activity: Not on file  Stress: Not on file  Social Connections: Not on file  Intimate Partner Violence: Not on file    FAMILY HISTORY: Family History  Problem Relation Age of Onset   Breast cancer Maternal Aunt    Breast cancer Maternal Aunt    Breast cancer Maternal Aunt    Lung cancer Maternal Uncle    Cancer Paternal Aunt        unk type   Lung cancer Maternal Aunt     ALLERGIES:  is allergic to other and penicillins.  MEDICATIONS:  Current Outpatient Medications  Medication Sig Dispense Refill   anastrozole (ARIMIDEX) 1 MG tablet Take 1 tablet by mouth once  daily 90 tablet 0   aspirin EC 81 MG tablet Take 81 mg by mouth daily. Swallow whole.      calcium carbonate (OSCAL) 1500 (600 Ca) MG TABS tablet Take by mouth 2 (two) times daily with a meal.     cholecalciferol (VITAMIN D3) 25 MCG (1000 UNIT) tablet Take 1,000 Units by mouth daily.     famotidine (PEPCID) 20 MG tablet Take 1 tablet (20 mg total) by mouth 2 (two) times daily. 60 tablet 0   hydrALAZINE (APRESOLINE) 100 MG tablet Take 100 mg by mouth 2 (two) times daily as needed.     Multiple Vitamins-Minerals (CENTRUM SILVER ADULT 50+ PO) Take 1 tablet by mouth daily.     Olmesartan-amLODIPine-HCTZ 40-10-25 MG TABS TAKE 1 TABLET BY MOUTH IN THE MORNING 90 tablet 0   rosuvastatin (CRESTOR) 40 MG tablet TAKE 1 TABLET BY MOUTH AT BEDTIME 90 tablet 0   nitrofurantoin (MACRODANTIN) 100 MG capsule Take 1 capsule (100 mg total) by mouth 2 (two) times daily. (Patient not taking: Reported on 01/08/2024) 6 capsule 0   Current Facility-Administered Medications  Medication Dose Route Frequency Provider Last Rate Last Admin   potassium chloride SA (KLOR-CON M) CR tablet 20 mEq  20 mEq Oral Once Sherron Monday, MD         PHYSICAL EXAMINATION: ECOG PERFORMANCE STATUS: 0 - Asymptomatic Vitals:   01/08/24 1036  BP: (!) 141/89  Pulse: 74  Resp: 18  Temp: 97.9 F (36.6 C)  SpO2: 100%   Filed Weights   01/08/24 1036  Weight: 177 lb (80.3 kg)    Physical Exam Constitutional:      General: She is not in acute distress.    Appearance: She is not diaphoretic.  HENT:     Head: Normocephalic and atraumatic.  Eyes:     General: No scleral icterus. Cardiovascular:     Rate and Rhythm: Normal rate and regular rhythm.  Pulmonary:     Effort: Pulmonary effort is normal. No respiratory distress.  Abdominal:     General: There is no distension.     Palpations: Abdomen is soft.  Musculoskeletal:        General: Normal range of motion.     Cervical back: Normal range of motion and neck supple.  Skin:    General: Skin is warm and dry.     Findings: No erythema.  Neurological:     Mental Status: She is alert and oriented to person, place, and time. Mental status is at baseline.     Motor: No abnormal muscle tone.  Psychiatric:        Mood and Affect: Mood and affect normal.      LABORATORY DATA:  I have reviewed the data as listed    Latest Ref Rng & Units 01/08/2024   10:17 AM 09/13/2023    8:40 AM 07/10/2023    9:46 AM  CBC  WBC 4.0 - 10.5 K/uL 6.0  9.1  8.4   Hemoglobin 12.0 - 15.0 g/dL 04.5  40.9  81.1   Hematocrit 36.0 - 46.0 % 42.2  45.3  40.6   Platelets 150 - 400 K/uL 325  286  275       Latest Ref Rng & Units 01/08/2024   10:16 AM 12/20/2023    9:46 AM 09/27/2023    9:53 AM  CMP  Glucose 70 - 99 mg/dL 88  83    BUN 6 - 20 mg/dL 14  13    Creatinine 9.14 - 1.00 mg/dL 7.82  9.56    Sodium 213 - 145 mmol/L 139  141    Potassium 3.5 - 5.1 mmol/L 3.5  3.9    Chloride 98 - 111 mmol/L 100  101    CO2 22 - 32 mmol/L 25  27    Calcium 8.9 - 10.3 mg/dL 9.6  08.6    Total Protein 6.5 - 8.1 g/dL 8.1  7.1  6.6   Total Bilirubin 0.0 - 1.2 mg/dL 0.4  0.3  0.3   Alkaline Phos 38 - 126 U/L 58  67  63   AST 15 - 41 U/L 24  19  19    ALT 0 - 44 U/L 15  12  15

## 2024-01-08 NOTE — Assessment & Plan Note (Signed)
 Encourage oral hydration and avoid nephrotoxins.

## 2024-01-08 NOTE — Assessment & Plan Note (Addendum)
 Left breast high-grade DCIS, ER positive, status post lumpectomy and optional sentinel lymph node biopsy and re-excision of margin.  pTis pN0.  Labs reviewed and discussed with patient. Continue Arimidex 1 mg daily, plan 5 years-Jan 2027 .  Refills were sent to pharmacy.  Annual Mammogram surveillance - Sept 2025

## 2024-01-21 ENCOUNTER — Other Ambulatory Visit

## 2024-01-29 ENCOUNTER — Other Ambulatory Visit

## 2024-01-29 ENCOUNTER — Other Ambulatory Visit: Payer: Self-pay | Admitting: Internal Medicine

## 2024-01-30 LAB — LIPID PANEL
Chol/HDL Ratio: 2.8 ratio (ref 0.0–4.4)
Cholesterol, Total: 119 mg/dL (ref 100–199)
HDL: 43 mg/dL (ref >39)
LDL Chol Calc (NIH): 59 mg/dL (ref 0–99)
Triglycerides: 84 mg/dL (ref 0–149)
VLDL Cholesterol Cal: 17 mg/dL (ref 5–40)

## 2024-01-30 LAB — COMPREHENSIVE METABOLIC PANEL WITH GFR
ALT: 17 IU/L (ref 0–32)
AST: 18 IU/L (ref 0–40)
Albumin: 4.1 g/dL (ref 3.8–4.9)
Alkaline Phosphatase: 63 IU/L (ref 44–121)
BUN/Creatinine Ratio: 11 (ref 9–23)
BUN: 12 mg/dL (ref 6–24)
Bilirubin Total: 0.2 mg/dL (ref 0.0–1.2)
CO2: 29 mmol/L (ref 20–29)
Calcium: 9.8 mg/dL (ref 8.7–10.2)
Chloride: 101 mmol/L (ref 96–106)
Creatinine, Ser: 1.06 mg/dL — ABNORMAL HIGH (ref 0.57–1.00)
Globulin, Total: 2.5 g/dL (ref 1.5–4.5)
Glucose: 94 mg/dL (ref 70–99)
Potassium: 3.8 mmol/L (ref 3.5–5.2)
Sodium: 143 mmol/L (ref 134–144)
Total Protein: 6.6 g/dL (ref 6.0–8.5)
eGFR: 61 mL/min/{1.73_m2} (ref >59)

## 2024-02-03 ENCOUNTER — Encounter: Payer: Self-pay | Admitting: Internal Medicine

## 2024-02-03 ENCOUNTER — Ambulatory Visit (INDEPENDENT_AMBULATORY_CARE_PROVIDER_SITE_OTHER): Admitting: Internal Medicine

## 2024-02-03 VITALS — BP 128/82 | HR 89 | Temp 98.2°F | Ht 64.0 in | Wt 177.0 lb

## 2024-02-03 DIAGNOSIS — I10 Essential (primary) hypertension: Secondary | ICD-10-CM

## 2024-02-03 DIAGNOSIS — I6330 Cerebral infarction due to thrombosis of unspecified cerebral artery: Secondary | ICD-10-CM

## 2024-02-03 NOTE — Progress Notes (Signed)
 Established Patient Office Visit  Subjective:  Patient ID: Lindsay Guzman, female    DOB: [birth year]  Age: 58 y.o. MRN: [id number]  Chief Complaint  Patient presents with   Follow-up    3 month lab results    No new complaints, here for lab review and medication refills. LDL and TC well controlled on lab review. Triglycerides also satisfactory while CMP only notable for improved but slightly elevated Cr.     No other concerns at this time.   Past Medical History:  Diagnosis Date   Arthritis    Family history of breast cancer    Family history of lung cancer    History of kidney stones    History of stroke 04/18/2020   Hypercholesteremia    Hypertension    Personal history of radiation therapy    Stroke [facility])     Past Surgical History:  Procedure Laterality Date   ABDOMINAL HYSTERECTOMY     BREAST BIOPSY Left ?   neg   BREAST BIOPSY Left 06/17/2020   stereo bx, x-clip, positive   BREAST LUMPECTOMY Left 07/25/2020   CHOLECYSTECTOMY     MASTECTOMY, PARTIAL Left 08/12/2020   Procedure: RE-EXCISION OF INFERIOR MARGIN OF LEFT BREAST, DRAINAGE OF SEROMA OF LEFT BREAST;  Surgeon: Eldred Grego, MD;  Location: [facility] ORS;  Service: General;  Laterality: Left;    Social History   Socioeconomic History   Marital status: Married    Spouse name: Not on file   Number of children: Not on file   Years of education: Not on file   Highest education level: Not on file  Occupational History   Not on file  Tobacco Use   Smoking status: Former    Current packs/day: 0.00    Types: Cigarettes    Quit date: 04/18/2020    Years since quitting: 3.7   Smokeless tobacco: Never  Substance and Sexual Activity   Alcohol use: Not Currently    Comment: occasionally    Drug use: Yes    Types: Marijuana   Sexual activity: Not on file  Other Topics Concern   Not on file  Social History Narrative   Not on file   Social Drivers of Health   Financial Resource Strain: Not on  file  Food Insecurity: Not on file  Transportation Needs: Not on file  Physical Activity: Not on file  Stress: Not on file  Social Connections: Not on file  Intimate Partner Violence: Not on file    Family History  Problem Relation Age of Onset   Breast cancer Maternal Aunt    Breast cancer Maternal Aunt    Breast cancer Maternal Aunt    Lung cancer Maternal Uncle    Cancer Paternal Aunt        unk type   Lung cancer Maternal Aunt     Allergies  Allergen Reactions   Other Anaphylaxis and Swelling    Nuts    Penicillins Hives    Outpatient Medications Prior to Visit  Medication Sig   anastrozole  (ARIMIDEX ) 1 MG tablet Take 1 tablet (1 mg total) by mouth daily.   aspirin  EC 81 MG tablet Take 81 mg by mouth daily. Swallow whole.    calcium  carbonate (OSCAL) 1500 (600 Ca) MG TABS tablet Take by mouth 2 (two) times daily with a meal.   cholecalciferol (VITAMIN D3) 25 MCG (1000 UNIT) tablet Take 1,000 Units by mouth daily.   famotidine  (PEPCID ) 20 MG tablet Take 1 tablet (20  mg total) by mouth 2 (two) times daily.   hydrALAZINE  (APRESOLINE ) 100 MG tablet Take 100 mg by mouth 2 (two) times daily as needed.   Multiple Vitamins-Minerals (CENTRUM SILVER ADULT 50+ PO) Take 1 tablet by mouth daily.   Olmesartan -amLODIPine -HCTZ 40-10-25 MG TABS TAKE 1 TABLET BY MOUTH IN THE MORNING   rosuvastatin  (CRESTOR ) 40 MG tablet TAKE 1 TABLET BY MOUTH AT BEDTIME   [DISCONTINUED] nitrofurantoin  (MACRODANTIN ) 100 MG capsule Take 1 capsule (100 mg total) by mouth 2 (two) times daily. (Patient not taking: Reported on 12/30/2023)   Facility-Administered Medications Prior to Visit  Medication Dose Route Frequency Provider   potassium chloride  SA (KLOR-CON  M) CR tablet 20 mEq  20 mEq Oral Once Tejan-Sie, Azavion Bouillon Ahmed, MD    Review of Systems  Constitutional: Negative.   HENT: Negative.    Eyes: Negative.   Respiratory: Negative.    Cardiovascular: Negative.   Gastrointestinal: Negative.    Genitourinary: Negative.   Musculoskeletal:  Positive for back pain and joint pain.  Skin: Negative.   Neurological: Negative.   Endo/Heme/Allergies: Negative.        Objective:   BP 128/82   Pulse 89   Temp 98.2 F (36.8 C)   Ht 5\' 4"  (1.626 m)   Wt 177 lb (80.3 kg)   SpO2 98%   BMI 30.38 kg/m   Vitals:   02/03/24 1411  BP: 128/82  Pulse: 89  Temp: 98.2 F (36.8 C)  Height: 5\' 4"  (1.626 m)  Weight: 177 lb (80.3 kg)  SpO2: 98%  BMI (Calculated): 30.37    Physical Exam Vitals and nursing note reviewed. Exam conducted with a chaperone present.  Constitutional:      Appearance: Normal appearance. She is normal weight.  HENT:     Head: Normocephalic.  Eyes:     Extraocular Movements: Extraocular movements intact.     Conjunctiva/sclera: Conjunctivae normal.     Pupils: Pupils are equal, round, and reactive to light.  Cardiovascular:     Rate and Rhythm: Normal rate.  Pulmonary:     Effort: Pulmonary effort is normal.  Genitourinary:    Exam position: Lithotomy position.     Vagina: Normal.     Cervix: Normal.  Neurological:     General: No focal deficit present.     Mental Status: She is alert and oriented to person, place, and time. Mental status is at baseline.  Psychiatric:        Mood and Affect: Mood normal.        Behavior: Behavior normal.        Thought Content: Thought content normal.        Judgment: Judgment normal.      No results found for any visits on 02/03/24.  CMP     Component Value Date/Time   NA 143 01/29/2024 0836   K 3.8 01/29/2024 0836   CL 101 01/29/2024 0836   CO2 29 01/29/2024 0836   GLUCOSE 94 01/29/2024 0836   GLUCOSE 88 01/08/2024 1016   BUN 12 01/29/2024 0836   CREATININE 1.06 (H) 01/29/2024 0836   CREATININE 1.12 (H) 01/08/2024 1016   CALCIUM  9.8 01/29/2024 0836   PROT 6.6 01/29/2024 0836   ALBUMIN 4.1 01/29/2024 0836   AST 18 01/29/2024 0836   AST 24 01/08/2024 1016   ALT 17 01/29/2024 0836   ALT 15  01/08/2024 1016   ALKPHOS 63 01/29/2024 0836   BILITOT 0.2 01/29/2024 0836   BILITOT 0.4 01/08/2024 1016  EGFR 61 01/29/2024 0836   GFRNONAA 57 (L) 01/08/2024 1016    Lipid Panel     Component Value Date/Time   CHOL 119 01/29/2024 0836   TRIG 84 01/29/2024 0836   HDL 43 01/29/2024 0836   CHOLHDL 2.8 01/29/2024 0836   CHOLHDL 5.4 04/20/2020 0438   VLDL 22 04/20/2020 0438   LDLCALC 59 01/29/2024 0836   LABVLDL 17 01/29/2024 0836       Assessment & Plan:  As per problem list. 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   Total time spent: 20 minutes  Arzella Bitters, MD  02/03/2024   This document may have been prepared by [facility] Voice Recognition software and as such may include unintentional dictation errors.

## 2024-02-24 ENCOUNTER — Ambulatory Visit
Admission: RE | Admit: 2024-02-24 | Discharge: 2024-02-24 | Disposition: A | Discharge status: Home / Self Care | Source: Ambulatory Visit | Attending: Oncology | Admitting: Oncology

## 2024-02-24 DIAGNOSIS — D0512 Intraductal carcinoma in situ of left breast: Secondary | ICD-10-CM | POA: Diagnosis present

## 2024-03-29 ENCOUNTER — Other Ambulatory Visit: Payer: Self-pay | Admitting: Internal Medicine

## 2024-03-29 DIAGNOSIS — I10 Essential (primary) hypertension: Secondary | ICD-10-CM

## 2024-04-10 ENCOUNTER — Ambulatory Visit

## 2024-04-10 ENCOUNTER — Other Ambulatory Visit

## 2024-04-10 ENCOUNTER — Ambulatory Visit: Admitting: Oncology

## 2024-04-13 ENCOUNTER — Inpatient Hospital Stay: Attending: Oncology

## 2024-04-13 ENCOUNTER — Encounter: Payer: Self-pay | Admitting: Oncology

## 2024-04-13 ENCOUNTER — Inpatient Hospital Stay

## 2024-04-13 ENCOUNTER — Inpatient Hospital Stay (HOSPITAL_BASED_OUTPATIENT_CLINIC_OR_DEPARTMENT_OTHER): Admitting: Oncology

## 2024-04-13 VITALS — BP 129/78 | HR 96 | Temp 98.9°F | Resp 18 | Wt 170.6 lb

## 2024-04-13 DIAGNOSIS — Z87442 Personal history of urinary calculi: Secondary | ICD-10-CM | POA: Insufficient documentation

## 2024-04-13 DIAGNOSIS — Z87891 Personal history of nicotine dependence: Secondary | ICD-10-CM | POA: Insufficient documentation

## 2024-04-13 DIAGNOSIS — Z9049 Acquired absence of other specified parts of digestive tract: Secondary | ICD-10-CM | POA: Insufficient documentation

## 2024-04-13 DIAGNOSIS — Z880 Allergy status to penicillin: Secondary | ICD-10-CM | POA: Insufficient documentation

## 2024-04-13 DIAGNOSIS — Z809 Family history of malignant neoplasm, unspecified: Secondary | ICD-10-CM | POA: Diagnosis not present

## 2024-04-13 DIAGNOSIS — N6310 Unspecified lump in the right breast, unspecified quadrant: Secondary | ICD-10-CM | POA: Insufficient documentation

## 2024-04-13 DIAGNOSIS — Z7982 Long term (current) use of aspirin: Secondary | ICD-10-CM | POA: Insufficient documentation

## 2024-04-13 DIAGNOSIS — D0512 Intraductal carcinoma in situ of left breast: Secondary | ICD-10-CM | POA: Insufficient documentation

## 2024-04-13 DIAGNOSIS — Z79811 Long term (current) use of aromatase inhibitors: Secondary | ICD-10-CM | POA: Diagnosis not present

## 2024-04-13 DIAGNOSIS — M810 Age-related osteoporosis without current pathological fracture: Secondary | ICD-10-CM | POA: Insufficient documentation

## 2024-04-13 DIAGNOSIS — I10 Essential (primary) hypertension: Secondary | ICD-10-CM | POA: Diagnosis not present

## 2024-04-13 DIAGNOSIS — Z8673 Personal history of transient ischemic attack (TIA), and cerebral infarction without residual deficits: Secondary | ICD-10-CM | POA: Diagnosis not present

## 2024-04-13 DIAGNOSIS — Z803 Family history of malignant neoplasm of breast: Secondary | ICD-10-CM | POA: Insufficient documentation

## 2024-04-13 DIAGNOSIS — Z90710 Acquired absence of both cervix and uterus: Secondary | ICD-10-CM | POA: Diagnosis not present

## 2024-04-13 DIAGNOSIS — M2550 Pain in unspecified joint: Secondary | ICD-10-CM | POA: Insufficient documentation

## 2024-04-13 DIAGNOSIS — Z923 Personal history of irradiation: Secondary | ICD-10-CM | POA: Diagnosis not present

## 2024-04-13 DIAGNOSIS — Z801 Family history of malignant neoplasm of trachea, bronchus and lung: Secondary | ICD-10-CM | POA: Insufficient documentation

## 2024-04-13 DIAGNOSIS — Z79899 Other long term (current) drug therapy: Secondary | ICD-10-CM | POA: Insufficient documentation

## 2024-04-13 LAB — CBC WITH DIFFERENTIAL (CANCER CENTER ONLY)
Abs Immature Granulocytes: 0.01 10*3/uL (ref 0.00–0.07)
Basophils Absolute: 0.1 10*3/uL (ref 0.0–0.1)
Basophils Relative: 1 %
Eosinophils Absolute: 0.2 10*3/uL (ref 0.0–0.5)
Eosinophils Relative: 3 %
HCT: 38 % (ref 36.0–46.0)
Hemoglobin: 12.4 g/dL (ref 12.0–15.0)
Immature Granulocytes: 0 %
Lymphocytes Relative: 41 %
Lymphs Abs: 2.6 10*3/uL (ref 0.7–4.0)
MCH: 27.6 pg (ref 26.0–34.0)
MCHC: 32.6 g/dL (ref 30.0–36.0)
MCV: 84.4 fL (ref 80.0–100.0)
Monocytes Absolute: 0.6 10*3/uL (ref 0.1–1.0)
Monocytes Relative: 9 %
Neutro Abs: 2.9 10*3/uL (ref 1.7–7.7)
Neutrophils Relative %: 46 %
Platelet Count: 297 10*3/uL (ref 150–400)
RBC: 4.5 MIL/uL (ref 3.87–5.11)
RDW: 13.3 % (ref 11.5–15.5)
WBC Count: 6.3 10*3/uL (ref 4.0–10.5)
nRBC: 0 % (ref 0.0–0.2)

## 2024-04-13 LAB — CMP (CANCER CENTER ONLY)
ALT: 14 U/L (ref 0–44)
AST: 21 U/L (ref 15–41)
Albumin: 4 g/dL (ref 3.5–5.0)
Alkaline Phosphatase: 62 U/L (ref 38–126)
Anion gap: 10 (ref 5–15)
BUN: 18 mg/dL (ref 6–20)
CO2: 27 mmol/L (ref 22–32)
Calcium: 9.4 mg/dL (ref 8.9–10.3)
Chloride: 98 mmol/L (ref 98–111)
Creatinine: 1.06 mg/dL — ABNORMAL HIGH (ref 0.44–1.00)
GFR, Estimated: >60 mL/min (ref >60)
Glucose, Bld: 95 mg/dL (ref 70–99)
Potassium: 3.3 mmol/L — ABNORMAL LOW (ref 3.5–5.1)
Sodium: 135 mmol/L (ref 135–145)
Total Bilirubin: 0.6 mg/dL (ref 0.0–1.2)
Total Protein: 7.8 g/dL (ref 6.5–8.1)

## 2024-04-13 MED ORDER — TAMOXIFEN CITRATE 20 MG PO TABS: 20.0000 mg | ORAL_TABLET | Freq: Every day | ORAL | 5 refills | Status: DC

## 2024-04-13 MED ORDER — DENOSUMAB 60 MG/ML ~~LOC~~ SOSY
60.0000 mg | PREFILLED_SYRINGE | Freq: Once | SUBCUTANEOUS | Status: DC
Start: 2024-04-13 — End: 2024-04-13
  Filled 2024-04-13: qty 1

## 2024-04-13 NOTE — Assessment & Plan Note (Addendum)
 05/18/2021 bone density showed left femoral neck T- 2.6.  She is not dentally cleared to receive bisphosphonate. Continue calcium  and vitamin D supplementation.

## 2024-04-13 NOTE — Progress Notes (Addendum)
 Hematology/Oncology Progress note [phone] [phone]      Patient Care Team: Albina GORMAN Dine, MD as PCP - General (Internal Medicine) Babara Call, MD as Consulting Physician (Oncology) Rodolph Romano, MD as Consulting Physician (General Surgery) Lenn Aran, MD as Referring Physician (Radiation Oncology) Dannielle Arlean FALCON, RN (Inactive) as Oncology Nurse Navigator (Oncology)  ASSESSMENT & PLAN:   Cancer Staging  Ductal carcinoma in situ (DCIS) of left breast Staging form: Breast, AJCC 8th Edition - Clinical stage from 07/27/2020: Stage 0 (cTis (DCIS), cN0, cM0, G3, ER+, PR: Not Assessed, HER2: Not Assessed) - Signed by Babara Call, MD on 11/08/2022   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  pTis pN0.  Labs reviewed and discussed with patient. Patient has been on Arimidex  1 mg daily.  She has osteoporosis. She has been evaluated by dentist, not able to get recommended procedures done.  Not able to get bisphosphonate treatments. Discussed about option of switching to tamoxifen 20 mg daily. Rationale potential side effects were reviewed with patient.  She takes aspirin  81 mg daily.  She agrees with the plan. plan 5 years of endocrine therapy. Till Jan 2027 .   Refills were sent to pharmacy.  Annual Mammogram surveillance - Sept 2025  Osteoporosis 05/18/2021 bone density showed left femoral neck T- 2.6.  She is not dentally cleared to receive bisphosphonate. Continue calcium  and vitamin D supplementation.    Orders Placed This Encounter  Procedures   US  LIMITED ULTRASOUND INCLUDING AXILLA LEFT BREAST     Standing Status:   Future    Expected Date:   07/01/2024    Expiration Date:   09/29/2024    Reason for Exam (SYMPTOM  OR DIAGNOSIS REQUIRED):   DCIS left breast    Preferred imaging location?:   [facility]   MM DIAG BREAST TOMO BILATERAL    Standing Status:   Future    Expected Date:   07/01/2024    Expiration Date:   09/29/2024    Reason for Exam (SYMPTOM  OR DIAGNOSIS REQUIRED):   DCIS left breast, follow up rt breast mass    Is the patient pregnant?:   No    Preferred imaging location?:    [facility]   US  LIMITED ULTRASOUND INCLUDING AXILLA RIGHT BREAST    Standing Status:   Future    Expected Date:   07/01/2024    Expiration Date:   04/13/2025    Reason for Exam (SYMPTOM  OR DIAGNOSIS REQUIRED):   follow up rt breast mass    Preferred imaging location?:    [facility]   CBC with Differential (Cancer Center Only)    Standing Status:   Future    Expected Date:   10/13/2024    Expiration Date:   01/11/2025   CMP (Cancer Center only)    Standing Status:   Future    Expected Date:   10/13/2024    Expiration Date:   01/11/2025   Follow up in 6 months.  . All questions were answered. The patient knows to call the clinic with any problems, questions or concerns.  Call Babara, MD, PhD [facility] Hematology Oncology 04/13/2024   CHIEF COMPLAINTS/REASON FOR VISIT:  Follow up for breast cancer DCIS  HISTORY OF PRESENTING ILLNESS:  Lindsay Guzman is a  58 y.o.  female with PMH listed below who presents for follow-up of left  breast DCIS. Oncology history listed as below. Oncology History  Ductal carcinoma in situ (DCIS) of left breast  06/09/2020 Imaging   06/09/2020 bilateral diagnostic mammogram showed Indeterminate medial left breast calcifications spanning 2.3 cm.  No other suspicious findings.  Medial right breast mass has been stable since 2012.   06/22/2020 Initial Diagnosis   Ductal carcinoma in situ (DCIS) of left breast  -06/17/2020 patient underwent stereotactic biopsy of left breast calcifications.  Biopsy showed high-grade DCIS with comedonecrosis.  Associated with calcification. -07/27/2020, patient proceed with left breast lumpectomy- Dr.Cintron,  Due to the  high-grade DCIS, patient opted to do sentinel lymph node biopsy at the same time of lumpectomy surgery.Pathology showed left breast high-grade DCIS, with comedonecrosis, negative for invasive component.  No lymph node involvement. Closest DCIS margin was 0.5 mm.  ER positive 08/12/20 re-excision of inferior margin.pathology showed negative for residual DCIS, or invasive carcinoma.      07/27/2020 Cancer Staging   Staging form: Breast, AJCC 8th Edition - Clinical stage from 07/27/2020: Stage 0 (cTis (DCIS), cN0, cM0, G3, ER+, PR: Not Assessed, HER2: Not Assessed) - Signed by Babara Call, MD on 11/08/2022 Stage prefix: Initial diagnosis Nuclear grade: G3 Histologic grading system: 3 grade system   09/21/2020 Genetic Testing   [facility] CustomNext+RNA cancer panel found no pathogenic mutations.    10/21/2020 -  Radiation Therapy   Adjuvant Radiation   10/2020 - 04/13/2024 Anti-estrogen oral therapy   On Arimidex    06/12/2021 Mammogram   Bilateral diagnostic mammogram showed Interval postoperative changes in the lower inner left breast at posterior depth. No mammographic findings of malignancy in either breast.   06/19/2023 Mammogram   Bilateral screening mammogram showed No mammographic evidence of malignancy.    04/13/2024 -  Anti-estrogen oral therapy   Switch to tamoxifen 20 mg daily.    INTERVAL HISTORY Lindsay Guzman is a 58 y.o. female who has above history reviewed by me today presents for follow up visit for management of left breast high-grade DCIS Patient tolerates Arimidex  1 mg daily.  Manageable side effect.     Review of Systems  Constitutional:  Negative for appetite change, chills, fatigue and fever.  HENT:   Negative for hearing loss and voice change.   Eyes:  Negative for eye problems.  Respiratory:  Negative for chest tightness, cough and shortness of breath.   Cardiovascular:  Negative for chest pain.  Gastrointestinal:  Negative for abdominal distention, abdominal pain  and blood in stool.  Endocrine: Negative for hot flashes.  Genitourinary:  Negative for difficulty urinating and frequency.   Musculoskeletal:  Positive for arthralgias.  Skin:  Negative for itching and rash.  Neurological:  Negative for extremity weakness.  Hematological:  Negative for adenopathy.  Psychiatric/Behavioral:  Negative for confusion.      MEDICAL HISTORY:  Past Medical History:  Diagnosis Date   Arthritis    Family history of breast cancer    Family history of lung cancer    History of kidney stones    History of stroke 04/18/2020   Hypercholesteremia    Hypertension    Personal history of radiation therapy    Stroke [facility])     SURGICAL HISTORY: Past Surgical History:  Procedure Laterality Date   ABDOMINAL HYSTERECTOMY     BREAST BIOPSY Left ?   neg   BREAST BIOPSY Left 06/17/2020   stereo bx, x-clip, positive   BREAST LUMPECTOMY Left 07/25/2020   CHOLECYSTECTOMY     MASTECTOMY, PARTIAL Left 08/12/2020  Procedure: RE-EXCISION OF INFERIOR MARGIN OF LEFT BREAST, DRAINAGE OF SEROMA OF LEFT BREAST;  Surgeon: Rodolph Romano, MD;  Location: [facility] ORS;  Service: General;  Laterality: Left;    SOCIAL HISTORY: Social History   Socioeconomic History   Marital status: Married    Spouse name: Not on file   Number of children: Not on file   Years of education: Not on file   Highest education level: Not on file  Occupational History   Not on file  Tobacco Use   Smoking status: Former    Current packs/day: 0.00    Types: Cigarettes    Quit date: 04/18/2020    Years since quitting: 3.9   Smokeless tobacco: Never  Substance and Sexual Activity   Alcohol use: Not Currently    Comment: occasionally    Drug use: Yes    Types: Marijuana   Sexual activity: Not on file  Other Topics Concern   Not on file  Social History Narrative   Not on file   Social Drivers of Health   Financial Resource Strain: Not on file  Food Insecurity: Not on file   Transportation Needs: Not on file  Physical Activity: Not on file  Stress: Not on file  Social Connections: Not on file  Intimate Partner Violence: Not on file    FAMILY HISTORY: Family History  Problem Relation Age of Onset   Breast cancer Maternal Aunt    Breast cancer Maternal Aunt    Breast cancer Maternal Aunt    Lung cancer Maternal Uncle    Cancer Paternal Aunt        unk type   Lung cancer Maternal Aunt     ALLERGIES:  is allergic to other and penicillins.  MEDICATIONS:  Current Outpatient Medications  Medication Sig Dispense Refill   aspirin  EC 81 MG tablet Take 81 mg by mouth daily. Swallow whole.      calcium  carbonate (OSCAL) 1500 (600 Ca) MG TABS tablet Take by mouth 2 (two) times daily with a meal.     cholecalciferol (VITAMIN D3) 25 MCG (1000 UNIT) tablet Take 1,000 Units by mouth daily.     famotidine  (PEPCID ) 20 MG tablet Take 1 tablet (20 mg total) by mouth 2 (two) times daily. 60 tablet 0   hydrALAZINE  (APRESOLINE ) 100 MG tablet Take 100 mg by mouth 2 (two) times daily as needed.     Multiple Vitamins-Minerals (CENTRUM SILVER ADULT 50+ PO) Take 1 tablet by mouth daily.     Olmesartan -amLODIPine -HCTZ 40-10-25 MG TABS TAKE 1 TABLET BY MOUTH IN THE MORNING 90 tablet 1   rosuvastatin  (CRESTOR ) 40 MG tablet TAKE 1 TABLET BY MOUTH AT BEDTIME 90 tablet 0   tamoxifen (NOLVADEX) 20 MG tablet Take 1 tablet (20 mg total) by mouth daily. 30 tablet 5   Current Facility-Administered Medications  Medication Dose Route Frequency Provider Last Rate Last Admin   potassium chloride  SA (KLOR-CON  M) CR tablet 20 mEq  20 mEq Oral Once Tejan-Sie, S Ahmed, MD       Facility-Administered Medications Ordered in Other Visits  Medication Dose Route Frequency Provider Last Rate Last Admin   denosumab (PROLIA) injection 60 mg  60 mg Subcutaneous Once Babara Call, MD         PHYSICAL EXAMINATION: ECOG PERFORMANCE STATUS: 0 - Asymptomatic Vitals:   04/13/24 1029  BP: 129/78  Pulse:  96  Resp: 18  Temp: 98.9 F (37.2 C)  SpO2: 99%   Filed Weights   04/13/24 1029  Weight: 170 lb 9.6 oz (77.4 kg)    Physical Exam Constitutional:      General: She is not in acute distress.    Appearance: She is not diaphoretic.  HENT:     Head: Normocephalic and atraumatic.   Eyes:     General: No scleral icterus.   Cardiovascular:     Rate and Rhythm: Normal rate and regular rhythm.  Pulmonary:     Effort: Pulmonary effort is normal. No respiratory distress.  Abdominal:     General: There is no distension.     Palpations: Abdomen is soft.   Musculoskeletal:        General: Normal range of motion.     Cervical back: Normal range of motion and neck supple.   Skin:    General: Skin is warm and dry.     Findings: No erythema.   Neurological:     Mental Status: She is alert and oriented to person, place, and time. Mental status is at baseline.     Motor: No abnormal muscle tone.   Psychiatric:        Mood and Affect: Mood and affect normal.      LABORATORY DATA:  I have reviewed the data as listed    Latest Ref Rng & Units 04/13/2024   10:19 AM 01/08/2024   10:17 AM 09/13/2023    8:40 AM  CBC  WBC 4.0 - 10.5 K/uL 6.3  6.0  9.1   Hemoglobin 12.0 - 15.0 g/dL 87.5  86.3  84.8   Hematocrit 36.0 - 46.0 % 38.0  42.2  45.3   Platelets 150 - 400 K/uL 297  325  286       Latest Ref Rng & Units 04/13/2024   10:19 AM 01/29/2024    8:36 AM 01/08/2024   10:16 AM  CMP  Glucose 70 - 99 mg/dL 95  94  88   BUN 6 - 20 mg/dL 18  12  14    Creatinine 0.44 - 1.00 mg/dL 8.93  8.93  8.87   Sodium 135 - 145 mmol/L 135  143  139   Potassium 3.5 - 5.1 mmol/L 3.3  3.8  3.5   Chloride 98 - 111 mmol/L 98  101  100   CO2 22 - 32 mmol/L 27  29  25    Calcium  8.9 - 10.3 mg/dL 9.4  9.8  9.6   Total Protein 6.5 - 8.1 g/dL 7.8  6.6  8.1   Total Bilirubin 0.0 - 1.2 mg/dL 0.6  0.2  0.4   Alkaline Phos 38 - 126 U/L 62  63  58   AST 15 - 41 U/L 21  18  24    ALT 0 - 44 U/L 14  17  15

## 2024-04-13 NOTE — Progress Notes (Signed)
 Per Dr. Babara, no Prolia today

## 2024-04-13 NOTE — Assessment & Plan Note (Addendum)
 Left breast high-grade DCIS, ER positive, status post lumpectomy and optional sentinel lymph node biopsy and re-excision of margin.  pTis pN0.  Labs reviewed and discussed with patient. Patient has been on Arimidex  1 mg daily.  She has osteoporosis. She has been evaluated by dentist, not able to get recommended procedures done.  Not able to get bisphosphonate treatments. Discussed about option of switching to tamoxifen 20 mg daily. Rationale potential side effects were reviewed with patient.  She takes aspirin  81 mg daily.  She agrees with the plan. plan 5 years of endocrine therapy. Till Jan 2027 .   Refills were sent to pharmacy.  Annual Mammogram surveillance - Sept 2025

## 2024-05-04 ENCOUNTER — Ambulatory Visit: Admitting: Internal Medicine

## 2024-05-05 ENCOUNTER — Other Ambulatory Visit

## 2024-05-05 DIAGNOSIS — D0512 Intraductal carcinoma in situ of left breast: Secondary | ICD-10-CM

## 2024-05-05 DIAGNOSIS — I10 Essential (primary) hypertension: Secondary | ICD-10-CM

## 2024-05-06 LAB — LIPID PANEL
Chol/HDL Ratio: 3.3 ratio (ref 0.0–4.4)
Cholesterol, Total: 144 mg/dL (ref 100–199)
HDL: 43 mg/dL (ref >39)
LDL Chol Calc (NIH): 82 mg/dL (ref 0–99)
Triglycerides: 102 mg/dL (ref 0–149)
VLDL Cholesterol Cal: 19 mg/dL (ref 5–40)

## 2024-05-06 LAB — COMPREHENSIVE METABOLIC PANEL WITH GFR
ALT: 13 IU/L (ref 0–32)
AST: 20 IU/L (ref 0–40)
Albumin: 4.4 g/dL (ref 3.8–4.9)
Alkaline Phosphatase: 66 IU/L (ref 44–121)
BUN/Creatinine Ratio: 12 (ref 9–23)
BUN: 13 mg/dL (ref 6–24)
Bilirubin Total: 0.3 mg/dL (ref 0.0–1.2)
CO2: 25 mmol/L (ref 20–29)
Calcium: 10.4 mg/dL — ABNORMAL HIGH (ref 8.7–10.2)
Chloride: 100 mmol/L (ref 96–106)
Creatinine, Ser: 1.05 mg/dL — ABNORMAL HIGH (ref 0.57–1.00)
Globulin, Total: 2.9 g/dL (ref 1.5–4.5)
Glucose: 92 mg/dL (ref 70–99)
Potassium: 4.1 mmol/L (ref 3.5–5.2)
Sodium: 143 mmol/L (ref 134–144)
Total Protein: 7.3 g/dL (ref 6.0–8.5)
eGFR: 61 mL/min/1.73 (ref >59)

## 2024-05-11 ENCOUNTER — Ambulatory Visit: Payer: Self-pay | Admitting: Internal Medicine

## 2024-05-11 ENCOUNTER — Ambulatory Visit (INDEPENDENT_AMBULATORY_CARE_PROVIDER_SITE_OTHER): Admitting: Internal Medicine

## 2024-05-11 ENCOUNTER — Encounter: Payer: Self-pay | Admitting: Internal Medicine

## 2024-05-11 ENCOUNTER — Other Ambulatory Visit: Payer: Self-pay | Admitting: Internal Medicine

## 2024-05-11 VITALS — BP 118/84 | HR 86 | Temp 98.6°F | Ht 64.0 in | Wt 169.8 lb

## 2024-05-11 DIAGNOSIS — I6330 Cerebral infarction due to thrombosis of unspecified cerebral artery: Secondary | ICD-10-CM

## 2024-05-11 DIAGNOSIS — I10 Essential (primary) hypertension: Secondary | ICD-10-CM | POA: Diagnosis not present

## 2024-05-11 DIAGNOSIS — I639 Cerebral infarction, unspecified: Secondary | ICD-10-CM

## 2024-05-11 MED ORDER — ROSUVASTATIN CALCIUM 40 MG PO TABS: 40.0000 mg | ORAL_TABLET | Freq: Every day | ORAL | 0 refills | Status: DC

## 2024-05-11 NOTE — Progress Notes (Signed)
 Established Patient Office Visit  Subjective:  Patient ID: Lindsay Guzman, female    DOB: Aug 30, 1966  Age: 58 y.o. MRN: [id number]  Chief Complaint  Patient presents with   Follow-up    3 month follow up    No new complaints, here for lab review and medication refills. Labs reviewed and notable for rise in LDL above 70 despite addition of Zetia however admits to dietary indiscretion.     No other concerns at this time.   Past Medical History:  Diagnosis Date   Arthritis    Family history of breast cancer    Family history of lung cancer    History of kidney stones    History of stroke 04/18/2020   Hypercholesteremia    Hypertension    Personal history of radiation therapy    Stroke [facility])     Past Surgical History:  Procedure Laterality Date   ABDOMINAL HYSTERECTOMY     BREAST BIOPSY Left ?   neg   BREAST BIOPSY Left 06/17/2020   stereo bx, x-clip, positive   BREAST LUMPECTOMY Left 07/25/2020   CHOLECYSTECTOMY     MASTECTOMY, PARTIAL Left 08/12/2020   Procedure: RE-EXCISION OF INFERIOR MARGIN OF LEFT BREAST, DRAINAGE OF SEROMA OF LEFT BREAST;  Surgeon: Rodolph Romano, MD;  Location: [facility] ORS;  Service: General;  Laterality: Left;    Social History   Socioeconomic History   Marital status: Married    Spouse name: Not on file   Number of children: Not on file   Years of education: Not on file   Highest education level: Not on file  Occupational History   Not on file  Tobacco Use   Smoking status: Former    Current packs/day: 0.00    Types: Cigarettes    Quit date: 04/18/2020    Years since quitting: 4.0   Smokeless tobacco: Never  Substance and Sexual Activity   Alcohol use: Not Currently    Comment: occasionally    Drug use: Yes    Types: Marijuana   Sexual activity: Not on file  Other Topics Concern   Not on file  Social History Narrative   Not on file   Social Drivers of Health   Financial Resource Strain: Not on file  Food Insecurity:  Not on file  Transportation Needs: Not on file  Physical Activity: Not on file  Stress: Not on file  Social Connections: Not on file  Intimate Partner Violence: Not on file    Family History  Problem Relation Age of Onset   Breast cancer Maternal Aunt    Breast cancer Maternal Aunt    Breast cancer Maternal Aunt    Lung cancer Maternal Uncle    Cancer Paternal Aunt        unk type   Lung cancer Maternal Aunt     Allergies  Allergen Reactions   Other Anaphylaxis and Swelling    Nuts    Penicillins Hives    Outpatient Medications Prior to Visit  Medication Sig   anastrozole  (ARIMIDEX ) 1 MG tablet Take 1 mg by mouth daily.   aspirin  EC 81 MG tablet Take 81 mg by mouth daily. Swallow whole.    Biotin 1 MG CAPS Take by mouth.   calcium  carbonate (OSCAL) 1500 (600 Ca) MG TABS tablet Take by mouth 2 (two) times daily with a meal.   cholecalciferol (VITAMIN D3) 25 MCG (1000 UNIT) tablet Take 1,000 Units by mouth daily.   ezetimibe (ZETIA) 10 MG tablet  Take 10 mg by mouth daily.   famotidine  (PEPCID ) 20 MG tablet Take 1 tablet (20 mg total) by mouth 2 (two) times daily. (Patient taking differently: Take 20 mg by mouth 2 (two) times daily. prn)   hydrALAZINE  (APRESOLINE ) 100 MG tablet Take 100 mg by mouth 2 (two) times daily as needed.   Multiple Vitamins-Minerals (CENTRUM SILVER ADULT 50+ PO) Take 1 tablet by mouth daily.   Olmesartan -amLODIPine -HCTZ 40-10-25 MG TABS TAKE 1 TABLET BY MOUTH IN THE MORNING   Probiotic Product (PROBIOTIC & ACIDOPHILUS EX ST PO) Take by mouth.   tamoxifen  (NOLVADEX ) 20 MG tablet Take 1 tablet (20 mg total) by mouth daily.   [DISCONTINUED] rosuvastatin  (CRESTOR ) 40 MG tablet TAKE 1 TABLET BY MOUTH AT BEDTIME   Facility-Administered Medications Prior to Visit  Medication Dose Route Frequency Provider   potassium chloride  SA (KLOR-CON  M) CR tablet 20 mEq  20 mEq Oral Once Tejan-Sie, Colie Josten Ahmed, MD    Review of Systems  Constitutional: Negative.   HENT:  Negative.    Eyes: Negative.   Respiratory: Negative.    Cardiovascular: Negative.   Gastrointestinal: Negative.   Genitourinary: Negative.   Musculoskeletal:  Positive for back pain and joint pain.  Skin: Negative.   Neurological: Negative.   Endo/Heme/Allergies: Negative.        Objective:   BP 118/84   Pulse 86   Temp 98.6 F (37 C) (Tympanic)   Ht 5' 4 (1.626 m)   Wt 169 lb 12.8 oz (77 kg)   SpO2 98%   BMI 29.15 kg/m   Vitals:   05/11/24 1004  BP: 118/84  Pulse: 86  Temp: 98.6 F (37 C)  Height: 5' 4 (1.626 m)  Weight: 169 lb 12.8 oz (77 kg)  SpO2: 98%  TempSrc: Tympanic  BMI (Calculated): 29.13    Physical Exam Vitals and nursing note reviewed. Exam conducted with a chaperone present.  Constitutional:      Appearance: Normal appearance. She is normal weight.  HENT:     Head: Normocephalic.  Eyes:     Extraocular Movements: Extraocular movements intact.     Conjunctiva/sclera: Conjunctivae normal.     Pupils: Pupils are equal, round, and reactive to light.  Cardiovascular:     Rate and Rhythm: Normal rate.     Heart sounds: Murmur (max RUSE) heard.     Crescendo decrescendo systolic murmur is present with a grade of 3/6.  Pulmonary:     Effort: Pulmonary effort is normal.  Genitourinary:    Exam position: Lithotomy position.     Vagina: Normal.     Cervix: Normal.  Neurological:     General: No focal deficit present.     Mental Status: She is alert and oriented to person, place, and time. Mental status is at baseline.  Psychiatric:        Mood and Affect: Mood normal.        Behavior: Behavior normal.        Thought Content: Thought content normal.        Judgment: Judgment normal.      No results found for any visits on 05/11/24.  Recent Results (from the past 2160 hours)  CBC with Differential (Cancer Center Only)     Status: None   Collection Time: 04/13/24 10:19 AM  Result Value Ref Range   WBC Count 6.3 4.0 - 10.5 K/uL   RBC  4.50 3.87 - 5.11 MIL/uL   Hemoglobin 12.4 12.0 - 15.0 g/dL  HCT 38.0 36.0 - 46.0 %   MCV 84.4 80.0 - 100.0 fL   MCH 27.6 26.0 - 34.0 pg   MCHC 32.6 30.0 - 36.0 g/dL   RDW 86.6 88.4 - 84.4 %   Platelet Count 297 150 - 400 K/uL   nRBC 0.0 0.0 - 0.2 %   Neutrophils Relative % 46 %   Neutro Abs 2.9 1.7 - 7.7 K/uL   Lymphocytes Relative 41 %   Lymphs Abs 2.6 0.7 - 4.0 K/uL   Monocytes Relative 9 %   Monocytes Absolute 0.6 0.1 - 1.0 K/uL   Eosinophils Relative 3 %   Eosinophils Absolute 0.2 0.0 - 0.5 K/uL   Basophils Relative 1 %   Basophils Absolute 0.1 0.0 - 0.1 K/uL   Immature Granulocytes 0 %   Abs Immature Granulocytes 0.01 0.00 - 0.07 K/uL    Comment: Performed at [facility], [address] [place], [place] [address]  CMP (Cancer Center only)     Status: Abnormal   Collection Time: 04/13/24 10:19 AM  Result Value Ref Range   Sodium 135 135 - 145 mmol/L   Potassium 3.3 (L) 3.5 - 5.1 mmol/L   Chloride 98 98 - 111 mmol/L   CO2 27 22 - 32 mmol/L   Glucose, Bld 95 70 - 99 mg/dL    Comment: Glucose reference range applies only to samples taken after fasting for at least 8 hours.   BUN 18 6 - 20 mg/dL   Creatinine 8.93 (H) 9.55 - 1.00 mg/dL   Calcium  9.4 8.9 - 10.3 mg/dL   Total Protein 7.8 6.5 - 8.1 g/dL   Albumin 4.0 3.5 - 5.0 g/dL   AST 21 15 - 41 U/L   ALT 14 0 - 44 U/L   Alkaline Phosphatase 62 38 - 126 U/L   Total Bilirubin 0.6 0.0 - 1.2 mg/dL   GFR, Estimated >39 >39 mL/min    Comment: (NOTE) Calculated using the CKD-EPI Creatinine Equation (2021)    Anion gap 10 5 - 15    Comment: Performed at [facility], [address] [place], [place] [address]  Lipid panel     Status: None   Collection Time: 05/05/24  8:43 AM  Result Value Ref Range   Cholesterol, Total 144 100 - 199 mg/dL   Triglycerides 897 0 - 149 mg/dL   HDL 43 >60 mg/dL   VLDL Cholesterol Cal 19 5 - 40 mg/dL   LDL Chol Calc (NIH) 82 0 - 99 mg/dL   Chol/HDL Ratio 3.3 0.0 - 4.4 ratio     Comment:                                   T. Chol/HDL Ratio                                             Men  Women                               1/2 Avg.Risk  3.4    3.3  Avg.Risk  5.0    4.4                                2X Avg.Risk  9.6    7.1                                3X Avg.Risk 23.4   11.0   Comprehensive metabolic panel with GFR     Status: Abnormal   Collection Time: 05/05/24  8:43 AM  Result Value Ref Range   Glucose 92 70 - 99 mg/dL   BUN 13 6 - 24 mg/dL   Creatinine, Ser 8.94 (H) 0.57 - 1.00 mg/dL   eGFR 61 >40 fO/fpw/8.26   BUN/Creatinine Ratio 12 9 - 23   Sodium 143 134 - 144 mmol/L   Potassium 4.1 3.5 - 5.2 mmol/L   Chloride 100 96 - 106 mmol/L   CO2 25 20 - 29 mmol/L   Calcium  10.4 (H) 8.7 - 10.2 mg/dL   Total Protein 7.3 6.0 - 8.5 g/dL   Albumin 4.4 3.8 - 4.9 g/dL   Globulin, Total 2.9 1.5 - 4.5 g/dL   Bilirubin Total 0.3 0.0 - 1.2 mg/dL   Alkaline Phosphatase 66 44 - 121 IU/L   AST 20 0 - 40 IU/L   ALT 13 0 - 32 IU/L      Assessment & Plan:  As per problem list. Stricter low calorie diet, low cholesterol and low fat diet and exercise as much as possible.  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   Total time  spent: 20 minutes  Sherrill Cinderella Perry, MD  05/11/2024   This document may have been prepared by [facility] Voice Recognition software and as such may include unintentional dictation errors.

## 2024-06-22 ENCOUNTER — Ambulatory Visit
Admission: RE | Admit: 2024-06-22 | Discharge: 2024-06-22 | Disposition: A | Discharge status: Home / Self Care | Source: Ambulatory Visit | Attending: Oncology

## 2024-06-22 ENCOUNTER — Ambulatory Visit: Admission: RE | Admit: 2024-06-22 | Source: Ambulatory Visit

## 2024-06-22 DIAGNOSIS — D0512 Intraductal carcinoma in situ of left breast: Secondary | ICD-10-CM | POA: Insufficient documentation

## 2024-08-06 ENCOUNTER — Other Ambulatory Visit

## 2024-08-07 LAB — LIPID PANEL
Chol/HDL Ratio: 2.6 ratio (ref 0.0–4.4)
Cholesterol, Total: 97 mg/dL — ABNORMAL LOW (ref 100–199)
HDL: 37 mg/dL — ABNORMAL LOW (ref >39)
LDL Chol Calc (NIH): 43 mg/dL (ref 0–99)
Triglycerides: 84 mg/dL (ref 0–149)
VLDL Cholesterol Cal: 17 mg/dL (ref 5–40)

## 2024-08-07 LAB — COMPREHENSIVE METABOLIC PANEL WITH GFR
ALT: 9 IU/L (ref 0–32)
AST: 20 IU/L (ref 0–40)
Albumin: 4.3 g/dL (ref 3.8–4.9)
Alkaline Phosphatase: 48 IU/L — ABNORMAL LOW (ref 49–135)
BUN/Creatinine Ratio: 12 (ref 9–23)
BUN: 14 mg/dL (ref 6–24)
Bilirubin Total: 0.3 mg/dL (ref 0.0–1.2)
CO2: 19 mmol/L — ABNORMAL LOW (ref 20–29)
Calcium: 9.5 mg/dL (ref 8.7–10.2)
Chloride: 103 mmol/L (ref 96–106)
Creatinine, Ser: 1.17 mg/dL — ABNORMAL HIGH (ref 0.57–1.00)
Globulin, Total: 3.1 g/dL (ref 1.5–4.5)
Glucose: 113 mg/dL — ABNORMAL HIGH (ref 70–99)
Potassium: 3.5 mmol/L (ref 3.5–5.2)
Sodium: 145 mmol/L — ABNORMAL HIGH (ref 134–144)
Total Protein: 7.4 g/dL (ref 6.0–8.5)
eGFR: 54 mL/min/1.73 — ABNORMAL LOW (ref >59)

## 2024-08-10 ENCOUNTER — Ambulatory Visit: Payer: Self-pay | Admitting: Internal Medicine

## 2024-08-10 ENCOUNTER — Encounter: Payer: Self-pay | Admitting: Internal Medicine

## 2024-08-10 ENCOUNTER — Ambulatory Visit (INDEPENDENT_AMBULATORY_CARE_PROVIDER_SITE_OTHER): Admitting: Internal Medicine

## 2024-08-10 VITALS — BP 108/78 | HR 89 | Ht 64.0 in | Wt 176.6 lb

## 2024-08-10 DIAGNOSIS — I10 Essential (primary) hypertension: Secondary | ICD-10-CM

## 2024-08-10 DIAGNOSIS — E870 Hyperosmolality and hypernatremia: Secondary | ICD-10-CM

## 2024-08-10 DIAGNOSIS — N1831 Chronic kidney disease, stage 3a: Secondary | ICD-10-CM | POA: Insufficient documentation

## 2024-08-10 NOTE — Progress Notes (Signed)
 Established Patient Office Visit  Subjective:  Patient ID: Lindsay Guzman, female    DOB: 1966/09/13  Age: 58 y.o. MRN: [id number]  Chief Complaint  Patient presents with   Results    1 month lab results    No new complaints, here for lab review and medication refills. BP lower than usual but denies lightheadedness or dizziness. Labs reviewed and notable for hypernatremia, rise in Cr with drop in gfr to which she admits poor fluid intake.LDL and TC well controlled on lab review. Triglycerides also satisfactory.     No other concerns at this time.   Past Medical History:  Diagnosis Date   Arthritis    Family history of breast cancer    Family history of lung cancer    History of kidney stones    History of stroke 04/18/2020   Hypercholesteremia    Hypertension    Personal history of radiation therapy    Stroke [facility])     Past Surgical History:  Procedure Laterality Date   ABDOMINAL HYSTERECTOMY     BREAST BIOPSY Left ?   neg   BREAST BIOPSY Left 06/17/2020   stereo bx, x-clip, positive   BREAST LUMPECTOMY Left 07/25/2020   CHOLECYSTECTOMY     MASTECTOMY, PARTIAL Left 08/12/2020   Procedure: RE-EXCISION OF INFERIOR MARGIN OF LEFT BREAST, DRAINAGE OF SEROMA OF LEFT BREAST;  Surgeon: Rodolph Romano, MD;  Location: [facility] ORS;  Service: General;  Laterality: Left;    Social History   Socioeconomic History   Marital status: Married    Spouse name: Not on file   Number of children: Not on file   Years of education: Not on file   Highest education level: Not on file  Occupational History   Not on file  Tobacco Use   Smoking status: Former    Current packs/day: 0.00    Types: Cigarettes    Quit date: 04/18/2020    Years since quitting: 4.3   Smokeless tobacco: Never  Substance and Sexual Activity   Alcohol use: Not Currently    Comment: occasionally    Drug use: Yes    Types: Marijuana   Sexual activity: Not on file  Other Topics Concern   Not on file   Social History Narrative   Not on file   Social Drivers of Health   Financial Resource Strain: Not on file  Food Insecurity: Not on file  Transportation Needs: Not on file  Physical Activity: Not on file  Stress: Not on file  Social Connections: Not on file  Intimate Partner Violence: Not on file    Family History  Problem Relation Age of Onset   Breast cancer Maternal Aunt    Breast cancer Maternal Aunt    Breast cancer Maternal Aunt    Lung cancer Maternal Uncle    Cancer Paternal Aunt        unk type   Lung cancer Maternal Aunt     Allergies  Allergen Reactions   Other Anaphylaxis and Swelling    Nuts    Penicillins Hives    Outpatient Medications Prior to Visit  Medication Sig   aspirin  EC 81 MG tablet Take 81 mg by mouth daily. Swallow whole.    calcium  carbonate (OSCAL) 1500 (600 Ca) MG TABS tablet Take by mouth 2 (two) times daily with a meal.   ezetimibe (ZETIA) 10 MG tablet Take 10 mg by mouth daily.   hydrALAZINE  (APRESOLINE ) 100 MG tablet Take 100 mg by mouth  2 (two) times daily as needed.   Multiple Vitamins-Minerals (CENTRUM SILVER ADULT 50+ PO) Take 1 tablet by mouth daily.   Olmesartan -amLODIPine -HCTZ 40-10-25 MG TABS TAKE 1 TABLET BY MOUTH IN THE MORNING   tamoxifen  (NOLVADEX ) 20 MG tablet Take 1 tablet (20 mg total) by mouth daily.   anastrozole  (ARIMIDEX ) 1 MG tablet Take 1 mg by mouth daily. (Patient not taking: Reported on 08/10/2024)   Biotin 1 MG CAPS Take by mouth. (Patient not taking: Reported on 08/10/2024)   cholecalciferol (VITAMIN D3) 25 MCG (1000 UNIT) tablet Take 1,000 Units by mouth daily. (Patient not taking: Reported on 08/10/2024)   famotidine  (PEPCID ) 20 MG tablet Take 1 tablet (20 mg total) by mouth 2 (two) times daily. (Patient not taking: Reported on 08/10/2024)   Probiotic Product (PROBIOTIC & ACIDOPHILUS EX ST PO) Take by mouth. (Patient not taking: Reported on 08/10/2024)   rosuvastatin  (CRESTOR ) 40 MG tablet Take 1 tablet (40  mg total) by mouth at bedtime. (Patient not taking: Reported on 08/10/2024)   Facility-Administered Medications Prior to Visit  Medication Dose Route Frequency Provider   potassium chloride  SA (KLOR-CON  M) CR tablet 20 mEq  20 mEq Oral Once Tejan-Sie, Navid Lenzen Ahmed, MD    Review of Systems  Constitutional: Negative.   HENT: Negative.    Eyes: Negative.   Respiratory: Negative.    Cardiovascular: Negative.   Gastrointestinal: Negative.   Genitourinary: Negative.   Musculoskeletal:  Positive for back pain and joint pain.  Skin: Negative.   Neurological: Negative.  Negative for dizziness.  Endo/Heme/Allergies: Negative.        Objective:   BP 108/78   Pulse 89   Ht 5' 4 (1.626 m)   Wt 176 lb 9.6 oz (80.1 kg)   SpO2 98%   BMI 30.31 kg/m   Vitals:   08/10/24 0948  BP: 108/78  Pulse: 89  Height: 5' 4 (1.626 m)  Weight: 176 lb 9.6 oz (80.1 kg)  SpO2: 98%  BMI (Calculated): 30.3    Physical Exam Vitals and nursing note reviewed. Exam conducted with a chaperone present.  Constitutional:      Appearance: Normal appearance. She is normal weight.  HENT:     Head: Normocephalic.  Eyes:     Extraocular Movements: Extraocular movements intact.     Conjunctiva/sclera: Conjunctivae normal.     Pupils: Pupils are equal, round, and reactive to light.  Cardiovascular:     Rate and Rhythm: Normal rate.     Heart sounds: Murmur (max RUSE) heard.     Crescendo decrescendo systolic murmur is present with a grade of 3/6.  Pulmonary:     Effort: Pulmonary effort is normal.  Genitourinary:    Exam position: Lithotomy position.     Vagina: Normal.     Cervix: Normal.  Neurological:     General: No focal deficit present.     Mental Status: She is alert and oriented to person, place, and time. Mental status is at baseline.  Psychiatric:        Mood and Affect: Mood normal.        Behavior: Behavior normal.        Thought Content: Thought content normal.        Judgment: Judgment  normal.      No results found for any visits on 08/10/24.  Recent Results (from the past 2160 hours)  Comprehensive metabolic panel with GFR     Status: Abnormal   Collection Time: 08/06/24  9:01 AM  Result Value Ref Range   Glucose 113 (H) 70 - 99 mg/dL   BUN 14 6 - 24 mg/dL   Creatinine, Ser 8.82 (H) 0.57 - 1.00 mg/dL   eGFR 54 (L) >40 fO/fpw/8.26   BUN/Creatinine Ratio 12 9 - 23   Sodium 145 (H) 134 - 144 mmol/L   Potassium 3.5 3.5 - 5.2 mmol/L   Chloride 103 96 - 106 mmol/L   CO2 19 (L) 20 - 29 mmol/L   Calcium  9.5 8.7 - 10.2 mg/dL   Total Protein 7.4 6.0 - 8.5 g/dL   Albumin 4.3 3.8 - 4.9 g/dL   Globulin, Total 3.1 1.5 - 4.5 g/dL   Bilirubin Total 0.3 0.0 - 1.2 mg/dL   Alkaline Phosphatase 48 (L) 49 - 135 IU/L   AST 20 0 - 40 IU/L   ALT 9 0 - 32 IU/L  Lipid panel     Status: Abnormal   Collection Time: 08/06/24  9:01 AM  Result Value Ref Range   Cholesterol, Total 97 (L) 100 - 199 mg/dL   Triglycerides 84 0 - 149 mg/dL   HDL 37 (L) >60 mg/dL   VLDL Cholesterol Cal 17 5 - 40 mg/dL   LDL Chol Calc (NIH) 43 0 - 99 mg/dL   Chol/HDL Ratio 2.6 0.0 - 4.4 ratio    Comment:                                   T. Chol/HDL Ratio                                             Men  Women                               1/2 Avg.Risk  3.4    3.3                                   Avg.Risk  5.0    4.4                                2X Avg.Risk  9.6    7.1                                3X Avg.Risk 23.4   11.0       Assessment & Plan:  Lindsay Guzman was seen today for results.  Hypernatremia  CKD stage 3a, GFR 45-59 ml/min (HCC)  Essential (primary) hypertension    Problem List Items Addressed This Visit       Cardiovascular and Mediastinum   Essential (primary) hypertension     Genitourinary   CKD stage 3a, GFR 45-59 ml/min (HCC)   Other Visit Diagnoses       Hypernatremia    -  Primary       Return in about 1 month (around 09/10/2024) for fu with labs prior.   Total  time spent: 30 minutes. This time includes review of previous notes and results and patient face to face interaction during today'Ginny Loomer visit.  Sherrill Cinderella Perry, MD  08/10/2024   This document may have been prepared by [facility] Voice Recognition software and as such may include unintentional dictation errors.

## 2024-08-14 ENCOUNTER — Other Ambulatory Visit: Payer: Self-pay | Admitting: Internal Medicine

## 2024-08-14 DIAGNOSIS — I6330 Cerebral infarction due to thrombosis of unspecified cerebral artery: Secondary | ICD-10-CM

## 2024-09-04 ENCOUNTER — Other Ambulatory Visit

## 2024-09-05 LAB — BMP8+ANION GAP
Anion Gap: 13 mmol/L (ref 10.0–18.0)
BUN/Creatinine Ratio: 18 (ref 9–23)
BUN: 21 mg/dL (ref 6–24)
CO2: 27 mmol/L (ref 20–29)
Calcium: 9.7 mg/dL (ref 8.7–10.2)
Chloride: 100 mmol/L (ref 96–106)
Creatinine, Ser: 1.19 mg/dL — ABNORMAL HIGH (ref 0.57–1.00)
Glucose: 117 mg/dL — ABNORMAL HIGH (ref 70–99)
Potassium: 3.7 mmol/L (ref 3.5–5.2)
Sodium: 140 mmol/L (ref 134–144)
eGFR: 53 mL/min/1.73 — ABNORMAL LOW (ref >59)

## 2024-09-08 ENCOUNTER — Ambulatory Visit (INDEPENDENT_AMBULATORY_CARE_PROVIDER_SITE_OTHER): Admitting: Internal Medicine

## 2024-09-08 ENCOUNTER — Ambulatory Visit: Payer: Self-pay | Admitting: Internal Medicine

## 2024-09-08 VITALS — BP 110/80 | HR 76 | Temp 98.1°F | Ht 64.0 in | Wt 178.4 lb

## 2024-09-08 DIAGNOSIS — I10 Essential (primary) hypertension: Secondary | ICD-10-CM | POA: Diagnosis not present

## 2024-09-08 DIAGNOSIS — I639 Cerebral infarction, unspecified: Secondary | ICD-10-CM

## 2024-09-08 DIAGNOSIS — E870 Hyperosmolality and hypernatremia: Secondary | ICD-10-CM | POA: Diagnosis not present

## 2024-09-08 MED ORDER — OLMESARTAN-AMLODIPINE-HCTZ 40-10-25 MG PO TABS: 1.0000 | ORAL_TABLET | Freq: Every morning | ORAL | 1 refills | Status: AC

## 2024-09-08 NOTE — Progress Notes (Signed)
 Established Patient Office Visit  Subjective:  Patient ID: Lindsay Guzman, female    DOB: 1966/04/03  Age: 58 y.o. MRN: [id number]  Chief Complaint  Patient presents with   Follow-up    4 week lab results    No new complaints, here for lab review and medication refills. BMP reviewed and notable for normalization of sodium with stable Cr and GFR. She admits to increasing her fluid intake to about 750 ml/day.    No other concerns at this time.   Past Medical History:  Diagnosis Date   Arthritis    Family history of breast cancer    Family history of lung cancer    History of kidney stones    History of stroke 04/18/2020   Hypercholesteremia    Hypertension    Personal history of radiation therapy    Stroke [facility])     Past Surgical History:  Procedure Laterality Date   ABDOMINAL HYSTERECTOMY     BREAST BIOPSY Left ?   neg   BREAST BIOPSY Left 06/17/2020   stereo bx, x-clip, positive   BREAST LUMPECTOMY Left 07/25/2020   CHOLECYSTECTOMY     MASTECTOMY, PARTIAL Left 08/12/2020   Procedure: RE-EXCISION OF INFERIOR MARGIN OF LEFT BREAST, DRAINAGE OF SEROMA OF LEFT BREAST;  Surgeon: Rodolph Romano, MD;  Location: [facility] ORS;  Service: General;  Laterality: Left;    Social History   Socioeconomic History   Marital status: Married    Spouse name: Not on file   Number of children: Not on file   Years of education: Not on file   Highest education level: Not on file  Occupational History   Not on file  Tobacco Use   Smoking status: Former    Current packs/day: 0.00    Types: Cigarettes    Quit date: 04/18/2020    Years since quitting: 4.3   Smokeless tobacco: Never  Substance and Sexual Activity   Alcohol use: Not Currently    Comment: occasionally    Drug use: Yes    Types: Marijuana   Sexual activity: Not on file  Other Topics Concern   Not on file  Social History Narrative   Not on file   Social Drivers of Health   Financial Resource Strain: Not on  file  Food Insecurity: Not on file  Transportation Needs: Not on file  Physical Activity: Not on file  Stress: Not on file  Social Connections: Not on file  Intimate Partner Violence: Not on file    Family History  Problem Relation Age of Onset   Breast cancer Maternal Aunt    Breast cancer Maternal Aunt    Breast cancer Maternal Aunt    Lung cancer Maternal Uncle    Cancer Paternal Aunt        unk type   Lung cancer Maternal Aunt     Allergies  Allergen Reactions   Other Anaphylaxis and Swelling    Nuts    Penicillins Hives    Outpatient Medications Prior to Visit  Medication Sig   aspirin  EC 81 MG tablet Take 81 mg by mouth daily. Swallow whole.    calcium  carbonate (OSCAL) 1500 (600 Ca) MG TABS tablet Take by mouth 2 (two) times daily with a meal.   hydrALAZINE  (APRESOLINE ) 100 MG tablet Take 100 mg by mouth 2 (two) times daily as needed.   Multiple Vitamins-Minerals (CENTRUM SILVER ADULT 50+ PO) Take 1 tablet by mouth daily.   rosuvastatin  (CRESTOR ) 40 MG tablet TAKE  1 TABLET BY MOUTH AT BEDTIME   tamoxifen  (NOLVADEX ) 20 MG tablet Take 1 tablet (20 mg total) by mouth daily.   [DISCONTINUED] Olmesartan -amLODIPine -HCTZ 40-10-25 MG TABS TAKE 1 TABLET BY MOUTH IN THE MORNING   anastrozole  (ARIMIDEX ) 1 MG tablet Take 1 mg by mouth daily. (Patient not taking: Reported on 09/08/2024)   Biotin 1 MG CAPS Take by mouth. (Patient not taking: Reported on 09/08/2024)   cholecalciferol (VITAMIN D3) 25 MCG (1000 UNIT) tablet Take 1,000 Units by mouth daily. (Patient not taking: Reported on 09/08/2024)   ezetimibe (ZETIA) 10 MG tablet Take 10 mg by mouth daily.   famotidine  (PEPCID ) 20 MG tablet Take 1 tablet (20 mg total) by mouth 2 (two) times daily. (Patient not taking: Reported on 09/08/2024)   Probiotic Product (PROBIOTIC & ACIDOPHILUS EX ST PO) Take by mouth. (Patient not taking: Reported on 09/08/2024)   Facility-Administered Medications Prior to Visit  Medication Dose Route  Frequency Provider   potassium chloride  SA (KLOR-CON  M) CR tablet 20 mEq  20 mEq Oral Once Tejan-Sie, Blaire Hodsdon Ahmed, MD    Review of Systems  Constitutional: Negative.   HENT: Negative.    Eyes: Negative.   Respiratory: Negative.    Cardiovascular: Negative.   Gastrointestinal: Negative.   Genitourinary: Negative.   Musculoskeletal:  Positive for back pain and joint pain.  Skin: Negative.   Neurological: Negative.  Negative for dizziness.  Endo/Heme/Allergies: Negative.        Objective:   BP 110/80   Pulse 76   Temp 98.1 F (36.7 C)   Ht 5' 4 (1.626 m)   Wt 178 lb 6.4 oz (80.9 kg)   SpO2 99%   BMI 30.62 kg/m   Vitals:   09/08/24 1057  BP: 110/80  Pulse: 76  Temp: 98.1 F (36.7 C)  Height: 5' 4 (1.626 m)  Weight: 178 lb 6.4 oz (80.9 kg)  SpO2: 99%  BMI (Calculated): 30.61    Physical Exam Vitals and nursing note reviewed. Exam conducted with a chaperone present.  Constitutional:      Appearance: Normal appearance. She is normal weight.  HENT:     Head: Normocephalic.  Eyes:     Extraocular Movements: Extraocular movements intact.     Conjunctiva/sclera: Conjunctivae normal.     Pupils: Pupils are equal, round, and reactive to light.  Cardiovascular:     Rate and Rhythm: Normal rate.     Heart sounds: Murmur (max RUSE) heard.     Crescendo decrescendo systolic murmur is present with a grade of 3/6.  Pulmonary:     Effort: Pulmonary effort is normal.  Genitourinary:    Exam position: Lithotomy position.     Vagina: Normal.     Cervix: Normal.  Neurological:     General: No focal deficit present.     Mental Status: She is alert and oriented to person, place, and time. Mental status is at baseline.  Psychiatric:        Mood and Affect: Mood normal.        Behavior: Behavior normal.        Thought Content: Thought content normal.        Judgment: Judgment normal.      No results found for any visits on 09/08/24.     Latest Ref Rng & Units  09/04/2024    8:49 AM 08/06/2024    9:01 AM 05/05/2024    8:43 AM  BMP  Glucose 70 - 99 mg/dL 882  886  92   BUN 6 - 24 mg/dL 21  14  13    Creatinine 0.57 - 1.00 mg/dL 8.80  8.82  8.94   BUN/Creat Ratio 9 - 23 18  12  12    Sodium 134 - 144 mmol/L 140  145  143   Potassium 3.5 - 5.2 mmol/L 3.7  3.5  4.1   Chloride 96 - 106 mmol/L 100  103  100   CO2 20 - 29 mmol/L 27  19  25    Calcium  8.7 - 10.2 mg/dL 9.7  9.5  89.5        Assessment & Plan:  Dacey was seen today for follow-up.  Hypernatremia  Essential (primary) hypertension -     Olmesartan -amLODIPine -HCTZ; Take 1 tablet by mouth every morning.  Dispense: 90 tablet; Refill: 1  Acute ischemic stroke (HCC) -     Comprehensive metabolic panel with GFR -     Lipid panel  Essential hypertension -     Comprehensive metabolic panel with GFR    Problem List Items Addressed This Visit       Cardiovascular and Mediastinum   Acute ischemic stroke (HCC)   Relevant Medications   Olmesartan -amLODIPine -HCTZ 40-10-25 MG TABS   Other Relevant Orders   Comprehensive metabolic panel with GFR   Lipid panel   Essential (primary) hypertension   Relevant Medications   Olmesartan -amLODIPine -HCTZ 40-10-25 MG TABS   Other Visit Diagnoses       Hypernatremia    -  Primary     Essential hypertension       Relevant Medications   Olmesartan -amLODIPine -HCTZ 40-10-25 MG TABS   Other Relevant Orders   Comprehensive metabolic panel with GFR       Return in about 2 months (around 11/08/2024) for fu with labs prior.   Total time spent: 20 minutes. This time includes review of previous notes and results and patient face to face interaction during today'[facility] visit.    Sherrill Cinderella Perry, MD  09/08/2024   This document may have been prepared by [facility] Voice Recognition software and as such may include unintentional dictation errors.

## 2024-10-07 ENCOUNTER — Other Ambulatory Visit: Payer: Self-pay

## 2024-10-07 DIAGNOSIS — D0512 Intraductal carcinoma in situ of left breast: Secondary | ICD-10-CM

## 2024-10-12 ENCOUNTER — Inpatient Hospital Stay (HOSPITAL_BASED_OUTPATIENT_CLINIC_OR_DEPARTMENT_OTHER): Admitting: Oncology

## 2024-10-12 ENCOUNTER — Inpatient Hospital Stay: Attending: Oncology

## 2024-10-12 ENCOUNTER — Encounter: Payer: Self-pay | Admitting: Oncology

## 2024-10-12 VITALS — BP 131/87 | HR 69 | Temp 97.4°F | Resp 18 | Wt 183.9 lb

## 2024-10-12 DIAGNOSIS — Z923 Personal history of irradiation: Secondary | ICD-10-CM | POA: Diagnosis not present

## 2024-10-12 DIAGNOSIS — Z803 Family history of malignant neoplasm of breast: Secondary | ICD-10-CM | POA: Diagnosis not present

## 2024-10-12 DIAGNOSIS — M810 Age-related osteoporosis without current pathological fracture: Secondary | ICD-10-CM | POA: Diagnosis not present

## 2024-10-12 DIAGNOSIS — Z87442 Personal history of urinary calculi: Secondary | ICD-10-CM | POA: Insufficient documentation

## 2024-10-12 DIAGNOSIS — Z79811 Long term (current) use of aromatase inhibitors: Secondary | ICD-10-CM | POA: Insufficient documentation

## 2024-10-12 DIAGNOSIS — Z90710 Acquired absence of both cervix and uterus: Secondary | ICD-10-CM | POA: Insufficient documentation

## 2024-10-12 DIAGNOSIS — Z79810 Long term (current) use of selective estrogen receptor modulators (SERMs): Secondary | ICD-10-CM | POA: Insufficient documentation

## 2024-10-12 DIAGNOSIS — Z8673 Personal history of transient ischemic attack (TIA), and cerebral infarction without residual deficits: Secondary | ICD-10-CM | POA: Diagnosis not present

## 2024-10-12 DIAGNOSIS — M2550 Pain in unspecified joint: Secondary | ICD-10-CM | POA: Insufficient documentation

## 2024-10-12 DIAGNOSIS — Z9049 Acquired absence of other specified parts of digestive tract: Secondary | ICD-10-CM | POA: Insufficient documentation

## 2024-10-12 DIAGNOSIS — Z7982 Long term (current) use of aspirin: Secondary | ICD-10-CM | POA: Insufficient documentation

## 2024-10-12 DIAGNOSIS — R7989 Other specified abnormal findings of blood chemistry: Secondary | ICD-10-CM

## 2024-10-12 DIAGNOSIS — Z79899 Other long term (current) drug therapy: Secondary | ICD-10-CM | POA: Diagnosis not present

## 2024-10-12 DIAGNOSIS — Z880 Allergy status to penicillin: Secondary | ICD-10-CM | POA: Diagnosis not present

## 2024-10-12 DIAGNOSIS — Z809 Family history of malignant neoplasm, unspecified: Secondary | ICD-10-CM | POA: Insufficient documentation

## 2024-10-12 DIAGNOSIS — Z801 Family history of malignant neoplasm of trachea, bronchus and lung: Secondary | ICD-10-CM | POA: Diagnosis not present

## 2024-10-12 DIAGNOSIS — Z9012 Acquired absence of left breast and nipple: Secondary | ICD-10-CM | POA: Insufficient documentation

## 2024-10-12 DIAGNOSIS — I10 Essential (primary) hypertension: Secondary | ICD-10-CM | POA: Diagnosis not present

## 2024-10-12 DIAGNOSIS — Z87891 Personal history of nicotine dependence: Secondary | ICD-10-CM | POA: Insufficient documentation

## 2024-10-12 DIAGNOSIS — D0512 Intraductal carcinoma in situ of left breast: Secondary | ICD-10-CM | POA: Diagnosis not present

## 2024-10-12 LAB — CMP (CANCER CENTER ONLY)
ALT: 9 U/L (ref 0–44)
AST: 21 U/L (ref 15–41)
Albumin: 4.3 g/dL (ref 3.5–5.0)
Alkaline Phosphatase: 40 U/L (ref 38–126)
Anion gap: 12 (ref 5–15)
BUN: 13 mg/dL (ref 6–20)
CO2: 27 mmol/L (ref 22–32)
Calcium: 9.7 mg/dL (ref 8.9–10.3)
Chloride: 101 mmol/L (ref 98–111)
Creatinine: 1.11 mg/dL — ABNORMAL HIGH (ref 0.44–1.00)
GFR, Estimated: 57 mL/min — ABNORMAL LOW
Glucose, Bld: 102 mg/dL — ABNORMAL HIGH (ref 70–99)
Potassium: 3.5 mmol/L (ref 3.5–5.1)
Sodium: 139 mmol/L (ref 135–145)
Total Bilirubin: 0.2 mg/dL (ref 0.0–1.2)
Total Protein: 7.3 g/dL (ref 6.5–8.1)

## 2024-10-12 LAB — CBC WITH DIFFERENTIAL (CANCER CENTER ONLY)
Abs Immature Granulocytes: 0.02 K/uL (ref 0.00–0.07)
Basophils Absolute: 0 K/uL (ref 0.0–0.1)
Basophils Relative: 1 %
Eosinophils Absolute: 0.2 K/uL (ref 0.0–0.5)
Eosinophils Relative: 2 %
HCT: 38.6 % (ref 36.0–46.0)
Hemoglobin: 12.8 g/dL (ref 12.0–15.0)
Immature Granulocytes: 0 %
Lymphocytes Relative: 44 %
Lymphs Abs: 3 K/uL (ref 0.7–4.0)
MCH: 29 pg (ref 26.0–34.0)
MCHC: 33.2 g/dL (ref 30.0–36.0)
MCV: 87.3 fL (ref 80.0–100.0)
Monocytes Absolute: 0.5 K/uL (ref 0.1–1.0)
Monocytes Relative: 7 %
Neutro Abs: 3.1 K/uL (ref 1.7–7.7)
Neutrophils Relative %: 46 %
Platelet Count: 256 K/uL (ref 150–400)
RBC: 4.42 MIL/uL (ref 3.87–5.11)
RDW: 13.1 % (ref 11.5–15.5)
WBC Count: 6.9 K/uL (ref 4.0–10.5)
nRBC: 0 % (ref 0.0–0.2)

## 2024-10-12 NOTE — Assessment & Plan Note (Signed)
 Encourage oral hydration and avoid nephrotoxins.

## 2024-10-12 NOTE — Progress Notes (Signed)
 " Hematology/Oncology Progress note [phone] [phone]      Patient Care Team: Albina GORMAN Dine, MD as PCP - General (Internal Medicine) Babara Call, MD as Consulting Physician (Oncology) Rodolph Romano, MD as Consulting Physician (General Surgery) Lenn Aran, MD as Referring Physician (Radiation Oncology) Dannielle Arlean FALCON, RN (Inactive) as Oncology Nurse Navigator (Oncology)  ASSESSMENT & PLAN:   Cancer Staging  Ductal carcinoma in situ (DCIS) of left breast Staging form: Breast, AJCC 8th Edition - Clinical stage from 07/27/2020: Stage 0 (cTis (DCIS), cN0, cM0, G3, ER+, PR: Not Assessed, HER2: Not Assessed) - Signed by Babara Call, MD on 11/08/2022   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  pTis pN0.   Previously she was on Arimidex  1 mg daily.  She has osteoporosis. She has been evaluated by dentist, not able to get recommended procedures done, therefore she has not able to get bisphosphonate treatments..  04/13/24 swithc to Tamoxifen  20 mg daily. Labs reviewed and discussed with patient. Continue Tamoxifen  plan continue and finish 5 years of endocrine therapy. Till Jan 2027 .   Refills were sent to pharmacy.  Annual Mammogram surveillance - Sept 2026  Elevated serum creatinine Encourage oral hydration and avoid nephrotoxins.    Osteoporosis 05/18/2021 bone density showed left femoral neck T- 2.6.  She is not dentally cleared to receive bisphosphonate. Continue calcium  and vitamin D supplementation.    Orders Placed This Encounter  Procedures   CBC with Differential (Cancer Center Only)    Standing Status:   Future    Expected Date:   04/12/2025    Expiration Date:   07/11/2025   CMP (Cancer Center only)    Standing Status:   Future    Expected Date:   04/12/2025    Expiration Date:   07/11/2025    Follow up in 6 months.  . All questions were answered. The patient knows to call the clinic with any problems, questions or concerns.  Call Babara, MD, PhD [facility] Hematology Oncology 10/12/2024   CHIEF COMPLAINTS/REASON FOR VISIT:  Follow up for breast cancer DCIS  HISTORY OF PRESENTING ILLNESS:  Lindsay Guzman is a  58 y.o.  female with PMH listed below who presents for follow-up of left breast DCIS. Oncology history listed as below. Oncology History  Ductal carcinoma in situ (DCIS) of left breast  06/09/2020 Imaging   06/09/2020 bilateral diagnostic mammogram showed Indeterminate medial left breast calcifications spanning 2.3 cm.  No other suspicious findings.  Medial right breast mass has been stable since 2012.   06/22/2020 Initial Diagnosis   Ductal carcinoma in situ (DCIS) of left breast  -06/17/2020 patient underwent stereotactic biopsy of left breast calcifications.  Biopsy showed high-grade DCIS with comedonecrosis.  Associated with calcification. -07/27/2020, patient proceed with left breast lumpectomy- Dr.Cintron,  Due to the high-grade DCIS, patient opted to do sentinel lymph node biopsy at the same time of lumpectomy surgery.Pathology showed left breast high-grade DCIS, with comedonecrosis, negative for invasive component.  No lymph node involvement. Closest DCIS margin was 0.5 mm.  ER positive 08/12/20 re-excision of inferior margin.pathology showed negative for residual DCIS, or invasive carcinoma.      07/27/2020 Cancer Staging   Staging form: Breast, AJCC 8th Edition - Clinical stage from 07/27/2020: Stage 0 (cTis (DCIS), cN0, cM0, G3, ER+, PR: Not Assessed, HER2: Not Assessed) - Signed by Babara Call, MD  on 11/08/2022 Stage prefix: Initial diagnosis Nuclear grade: G3 Histologic grading system: 3 grade system   09/21/2020 Genetic Testing   [facility] CustomNext+RNA cancer panel found no pathogenic mutations.    10/21/2020 -  Radiation Therapy   Adjuvant Radiation   10/2020  - 04/13/2024 Anti-estrogen oral therapy   On Arimidex    06/12/2021 Mammogram   Bilateral diagnostic mammogram showed Interval postoperative changes in the lower inner left breast at posterior depth. No mammographic findings of malignancy in either breast.   06/19/2023 Mammogram   Bilateral screening mammogram showed No mammographic evidence of malignancy.    04/13/2024 -  Anti-estrogen oral therapy   Switch to tamoxifen  20 mg daily.    INTERVAL HISTORY Lindsay Guzman is a 58 y.o. female who has above history reviewed by me today presents for follow up visit for management of left breast high-grade DCIS Patient tolerates tamoxifen  20 mg daily.  Manageable side effect.     Review of Systems  Constitutional:  Negative for appetite change, chills, fatigue and fever.  HENT:   Negative for hearing loss and voice change.   Eyes:  Negative for eye problems.  Respiratory:  Negative for chest tightness, cough and shortness of breath.   Cardiovascular:  Negative for chest pain.  Gastrointestinal:  Negative for abdominal distention, abdominal pain and blood in stool.  Endocrine: Negative for hot flashes.  Genitourinary:  Negative for difficulty urinating and frequency.   Musculoskeletal:  Positive for arthralgias.  Skin:  Negative for itching and rash.  Neurological:  Negative for extremity weakness.  Hematological:  Negative for adenopathy.  Psychiatric/Behavioral:  Negative for confusion.      MEDICAL HISTORY:  Past Medical History:  Diagnosis Date   Arthritis    Family history of breast cancer    Family history of lung cancer    History of kidney stones    History of stroke 04/18/2020   Hypercholesteremia    Hypertension    Personal history of radiation therapy    Stroke [facility])     SURGICAL HISTORY: Past Surgical History:  Procedure Laterality Date   ABDOMINAL HYSTERECTOMY     BREAST BIOPSY Left ?   neg   BREAST BIOPSY Left 06/17/2020   stereo bx, x-clip, positive    BREAST LUMPECTOMY Left 07/25/2020   CHOLECYSTECTOMY     MASTECTOMY, PARTIAL Left 08/12/2020   Procedure: RE-EXCISION OF INFERIOR MARGIN OF LEFT BREAST, DRAINAGE OF SEROMA OF LEFT BREAST;  Surgeon: Rodolph Romano, MD;  Location: [facility] ORS;  Service: General;  Laterality: Left;    SOCIAL HISTORY: Social History   Socioeconomic History   Marital status: Married    Spouse name: Not on file   Number of children: Not on file   Years of education: Not on file   Highest education level: Not on file  Occupational History   Not on file  Tobacco Use   Smoking status: Former    Current packs/day: 0.00    Types: Cigarettes    Quit date: 04/18/2020    Years since quitting: 4.4   Smokeless tobacco: Never  Substance and Sexual Activity   Alcohol use: Not Currently    Comment: occasionally    Drug use: Yes    Types: Marijuana   Sexual activity: Not on file  Other Topics Concern   Not on file  Social History Narrative   Not on file   Social Drivers of Health   Tobacco Use: Medium Risk (10/12/2024)   Patient History  Smoking Tobacco Use: Former    Smokeless Tobacco Use: Never    Passive Exposure: Not on Actuary Strain: Not on file  Food Insecurity: Not on file  Transportation Needs: Not on file  Physical Activity: Not on file  Stress: Not on file  Social Connections: Not on file  Intimate Partner Violence: Not on file  Depression (PHQ2-9): Medium Risk (10/28/2023)   Depression (PHQ2-9)    PHQ-2 Score: 5  Alcohol Screen: Not on file  Housing: Not on file  Utilities: Not on file  Health Literacy: Not on file    FAMILY HISTORY: Family History  Problem Relation Age of Onset   Breast cancer Maternal Aunt    Breast cancer Maternal Aunt    Breast cancer Maternal Aunt    Lung cancer Maternal Uncle    Cancer Paternal Aunt        unk type   Lung cancer Maternal Aunt     ALLERGIES:  is allergic to other and penicillins.  MEDICATIONS:  Current  Outpatient Medications  Medication Sig Dispense Refill   aspirin  EC 81 MG tablet Take 81 mg by mouth daily. Swallow whole.      calcium  carbonate (OSCAL) 1500 (600 Ca) MG TABS tablet Take by mouth 2 (two) times daily with a meal.     ezetimibe (ZETIA) 10 MG tablet Take 10 mg by mouth daily.     hydrALAZINE  (APRESOLINE ) 100 MG tablet Take 100 mg by mouth 2 (two) times daily as needed. (Patient taking differently: Take 100 mg by mouth 2 (two) times daily.)     Multiple Vitamins-Minerals (CENTRUM SILVER ADULT 50+ PO) Take 1 tablet by mouth daily.     Olmesartan -amLODIPine -HCTZ 40-10-25 MG TABS Take 1 tablet by mouth every morning. 90 tablet 1   Probiotic Product (PROBIOTIC & ACIDOPHILUS EX ST PO) Take by mouth.     rosuvastatin  (CRESTOR ) 40 MG tablet TAKE 1 TABLET BY MOUTH AT BEDTIME 90 tablet 0   tamoxifen  (NOLVADEX ) 20 MG tablet Take 1 tablet (20 mg total) by mouth daily. 30 tablet 5   Biotin 1 MG CAPS Take by mouth. (Patient not taking: Reported on 09/08/2024)     cholecalciferol (VITAMIN D3) 25 MCG (1000 UNIT) tablet Take 1,000 Units by mouth daily. (Patient not taking: Reported on 09/08/2024)     famotidine  (PEPCID ) 20 MG tablet Take 1 tablet (20 mg total) by mouth 2 (two) times daily. (Patient not taking: Reported on 09/08/2024) 60 tablet 0   Current Facility-Administered Medications  Medication Dose Route Frequency Provider Last Rate Last Admin   potassium chloride  SA (KLOR-CON  M) CR tablet 20 mEq  20 mEq Oral Once Tejan-Sie, S Ahmed, MD         PHYSICAL EXAMINATION: ECOG PERFORMANCE STATUS: 0 - Asymptomatic Vitals:   10/12/24 1055  BP: 131/87  Pulse: 69  Resp: 18  Temp: (!) 97.4 F (36.3 C)   Filed Weights   10/12/24 1055  Weight: 183 lb 14.4 oz (83.4 kg)    Physical Exam Constitutional:      General: She is not in acute distress.    Appearance: She is not diaphoretic.  HENT:     Head: Normocephalic and atraumatic.  Eyes:     General: No scleral  icterus. Cardiovascular:     Rate and Rhythm: Normal rate and regular rhythm.  Pulmonary:     Effort: Pulmonary effort is normal. No respiratory distress.  Abdominal:     General: There is no distension.  Palpations: Abdomen is soft.  Musculoskeletal:        General: Normal range of motion.     Cervical back: Normal range of motion and neck supple.  Skin:    General: Skin is warm and dry.     Findings: No erythema.  Neurological:     Mental Status: She is alert and oriented to person, place, and time. Mental status is at baseline.     Motor: No abnormal muscle tone.  Psychiatric:        Mood and Affect: Mood and affect normal.      LABORATORY DATA:  I have reviewed the data as listed    Latest Ref Rng & Units 10/12/2024    9:46 AM 04/13/2024   10:19 AM 01/08/2024   10:17 AM  CBC  WBC 4.0 - 10.5 K/uL 6.9  6.3  6.0   Hemoglobin 12.0 - 15.0 g/dL 87.1  87.5  86.3   Hematocrit 36.0 - 46.0 % 38.6  38.0  42.2   Platelets 150 - 400 K/uL 256  297  325       Latest Ref Rng & Units 10/12/2024    9:46 AM 09/04/2024    8:49 AM 08/06/2024    9:01 AM  CMP  Glucose 70 - 99 mg/dL 897  882  886   BUN 6 - 20 mg/dL 13  21  14    Creatinine 0.44 - 1.00 mg/dL 8.88  8.80  8.82   Sodium 135 - 145 mmol/L 139  140  145   Potassium 3.5 - 5.1 mmol/L 3.5  3.7  3.5   Chloride 98 - 111 mmol/L 101  100  103   CO2 22 - 32 mmol/L 27  27  19    Calcium  8.9 - 10.3 mg/dL 9.7  9.7  9.5   Total Protein 6.5 - 8.1 g/dL 7.3   7.4   Total Bilirubin 0.0 - 1.2 mg/dL 0.2   0.3   Alkaline Phos 38 - 126 U/L 40   48   AST 15 - 41 U/L 21   20   ALT 0 - 44 U/L 9   9        "

## 2024-10-12 NOTE — Assessment & Plan Note (Signed)
 05/18/2021 bone density showed left femoral neck T- 2.6.  She is not dentally cleared to receive bisphosphonate. Continue calcium  and vitamin D supplementation.

## 2024-10-12 NOTE — Assessment & Plan Note (Addendum)
 Left breast high-grade DCIS, ER positive, status post lumpectomy and optional sentinel lymph node biopsy and re-excision of margin.  pTis pN0.   Previously she was on Arimidex  1 mg daily.  She has osteoporosis. She has been evaluated by dentist, not able to get recommended procedures done, therefore she has not able to get bisphosphonate treatments..  04/13/24 swithc to Tamoxifen  20 mg daily. Labs reviewed and discussed with patient. Continue Tamoxifen  plan continue and finish 5 years of endocrine therapy. Till Jan 2027 .   Refills were sent to pharmacy.  Annual Mammogram surveillance - Sept 2026

## 2024-11-06 ENCOUNTER — Other Ambulatory Visit

## 2024-11-06 DIAGNOSIS — I639 Cerebral infarction, unspecified: Secondary | ICD-10-CM

## 2024-11-07 LAB — COMPREHENSIVE METABOLIC PANEL WITH GFR
ALT: 9 [IU]/L (ref 0–32)
AST: 18 [IU]/L (ref 0–40)
Albumin: 4.1 g/dL (ref 3.8–4.9)
Alkaline Phosphatase: 40 [IU]/L — ABNORMAL LOW (ref 49–135)
BUN/Creatinine Ratio: 12 (ref 12–28)
BUN: 15 mg/dL (ref 8–27)
Bilirubin Total: 0.3 mg/dL (ref 0.0–1.2)
CO2: 24 mmol/L (ref 20–29)
Calcium: 9.3 mg/dL (ref 8.7–10.3)
Chloride: 102 mmol/L (ref 96–106)
Creatinine, Ser: 1.21 mg/dL — ABNORMAL HIGH (ref 0.57–1.00)
Globulin, Total: 2.8 g/dL (ref 1.5–4.5)
Glucose: 125 mg/dL — ABNORMAL HIGH (ref 70–99)
Potassium: 3.5 mmol/L (ref 3.5–5.2)
Sodium: 141 mmol/L (ref 134–144)
Total Protein: 6.9 g/dL (ref 6.0–8.5)
eGFR: 51 mL/min/{1.73_m2} — ABNORMAL LOW

## 2024-11-07 LAB — LIPID PANEL
Chol/HDL Ratio: 2.7 ratio (ref 0.0–4.4)
Cholesterol, Total: 91 mg/dL — ABNORMAL LOW (ref 100–199)
HDL: 34 mg/dL — ABNORMAL LOW
LDL Chol Calc (NIH): 41 mg/dL (ref 0–99)
Triglycerides: 77 mg/dL (ref 0–149)
VLDL Cholesterol Cal: 16 mg/dL (ref 5–40)

## 2024-11-07 LAB — CK: Total CK: 111 U/L (ref 32–182)

## 2024-11-09 ENCOUNTER — Other Ambulatory Visit: Payer: Self-pay | Admitting: Internal Medicine

## 2024-11-09 ENCOUNTER — Other Ambulatory Visit: Payer: Self-pay | Admitting: Oncology

## 2024-11-09 DIAGNOSIS — I6330 Cerebral infarction due to thrombosis of unspecified cerebral artery: Secondary | ICD-10-CM

## 2024-11-10 ENCOUNTER — Encounter: Payer: Self-pay | Admitting: Oncology

## 2024-11-10 ENCOUNTER — Ambulatory Visit: Admitting: Internal Medicine

## 2024-11-11 ENCOUNTER — Ambulatory Visit: Admitting: Internal Medicine

## 2024-11-18 ENCOUNTER — Ambulatory Visit (INDEPENDENT_AMBULATORY_CARE_PROVIDER_SITE_OTHER): Admitting: Internal Medicine

## 2024-11-18 ENCOUNTER — Ambulatory Visit: Payer: Self-pay | Admitting: Internal Medicine

## 2024-11-18 ENCOUNTER — Encounter: Payer: Self-pay | Admitting: Internal Medicine

## 2024-11-18 VITALS — BP 121/79 | HR 86 | Temp 98.3°F | Ht 64.0 in | Wt 180.0 lb

## 2024-11-18 DIAGNOSIS — I10 Essential (primary) hypertension: Secondary | ICD-10-CM

## 2024-11-18 DIAGNOSIS — Z853 Personal history of malignant neoplasm of breast: Secondary | ICD-10-CM | POA: Diagnosis not present

## 2024-11-18 DIAGNOSIS — N1831 Chronic kidney disease, stage 3a: Secondary | ICD-10-CM

## 2024-11-18 DIAGNOSIS — Z8673 Personal history of transient ischemic attack (TIA), and cerebral infarction without residual deficits: Secondary | ICD-10-CM | POA: Diagnosis not present

## 2024-11-18 LAB — POCT URINALYSIS DIPSTICK
Bilirubin, UA: NEGATIVE
Blood, UA: POSITIVE
Glucose, UA: POSITIVE — AB
Ketones, UA: NEGATIVE
Nitrite, UA: NEGATIVE
Protein, UA: POSITIVE — AB
Spec Grav, UA: 1.02
Urobilinogen, UA: 0.2 U/dL
pH, UA: 6

## 2024-11-18 LAB — POC CREATINE & ALBUMIN,URINE
Albumin/Creatinine Ratio, Urine, POC: >300
Creatinine, POC: 100 mg/dL
Microalbumin Ur, POC: 150 mg/L

## 2024-11-18 MED ORDER — KERENDIA 10 MG PO TABS: 10.0000 mg | ORAL_TABLET | Freq: Every day | ORAL | 0 refills | Status: AC

## 2024-11-18 NOTE — Progress Notes (Signed)
 "  Established Patient Office Visit  Subjective:  Patient ID: Lindsay Guzman, female    DOB: 01-17-1966  Age: 59 y.o. MRN: [id number]  Chief Complaint  Patient presents with   Follow-up    2 month follow up    No new complaints, here for lab review and medication refills.     No other concerns at this time.   Past Medical History:  Diagnosis Date   Arthritis    Family history of breast cancer    Family history of lung cancer    History of kidney stones    History of stroke 04/18/2020   Hypercholesteremia    Hypertension    Personal history of radiation therapy    Stroke [facility])     Past Surgical History:  Procedure Laterality Date   ABDOMINAL HYSTERECTOMY     BREAST BIOPSY Left ?   neg   BREAST BIOPSY Left 06/17/2020   stereo bx, x-clip, positive   BREAST LUMPECTOMY Left 07/25/2020   CHOLECYSTECTOMY     MASTECTOMY, PARTIAL Left 08/12/2020   Procedure: RE-EXCISION OF INFERIOR MARGIN OF LEFT BREAST, DRAINAGE OF SEROMA OF LEFT BREAST;  Surgeon: Rodolph Romano, MD;  Location: [facility] ORS;  Service: General;  Laterality: Left;    Social History   Socioeconomic History   Marital status: Married    Spouse name: Not on file   Number of children: Not on file   Years of education: Not on file   Highest education level: Not on file  Occupational History   Not on file  Tobacco Use   Smoking status: Former    Current packs/day: 0.00    Types: Cigarettes    Quit date: 04/18/2020    Years since quitting: 4.5   Smokeless tobacco: Never  Substance and Sexual Activity   Alcohol use: Not Currently    Comment: occasionally    Drug use: Yes    Types: Marijuana   Sexual activity: Not on file  Other Topics Concern   Not on file  Social History Narrative   Not on file   Social Drivers of Health   Tobacco Use: Medium Risk (11/18/2024)   Patient History    Smoking Tobacco Use: Former    Smokeless Tobacco Use: Never    Passive Exposure: Not on Actuary  Strain: Not on file  Food Insecurity: Not on file  Transportation Needs: Not on file  Physical Activity: Not on file  Stress: Not on file  Social Connections: Not on file  Intimate Partner Violence: Not on file  Depression (PHQ2-9): Medium Risk (10/28/2023)   Depression (PHQ2-9)    PHQ-2 Score: 5  Alcohol Screen: Not on file  Housing: Not on file  Utilities: Not on file  Health Literacy: Not on file    Family History  Problem Relation Age of Onset   Breast cancer Maternal Aunt    Breast cancer Maternal Aunt    Breast cancer Maternal Aunt    Lung cancer Maternal Uncle    Cancer Paternal Aunt        unk type   Lung cancer Maternal Aunt     Allergies[1]  Show/hide medication list[2]  Review of Systems  Constitutional:  Positive for weight loss (3 lbs).  HENT: Negative.    Eyes: Negative.   Respiratory: Negative.    Cardiovascular: Negative.   Gastrointestinal: Negative.   Genitourinary: Negative.   Musculoskeletal:  Positive for back pain and joint pain.  Skin: Negative.   Neurological:  Negative.  Negative for dizziness.  Endo/Heme/Allergies: Negative.        Objective:   BP 121/79   Pulse 86   Temp 98.3 F (36.8 C)   Ht 5' 4 (1.626 m)   Wt 180 lb (81.6 kg)   SpO2 99%   BMI 30.90 kg/m   Vitals:   11/18/24 0839  BP: 121/79  Pulse: 86  Temp: 98.3 F (36.8 C)  Height: 5' 4 (1.626 m)  Weight: 180 lb (81.6 kg)  SpO2: 99%  BMI (Calculated): 30.88    Physical Exam Vitals and nursing note reviewed. Exam conducted with a chaperone present.  Constitutional:      Appearance: Normal appearance. She is normal weight.  HENT:     Head: Normocephalic.  Eyes:     Extraocular Movements: Extraocular movements intact.     Conjunctiva/sclera: Conjunctivae normal.     Pupils: Pupils are equal, round, and reactive to light.  Cardiovascular:     Rate and Rhythm: Normal rate.     Heart sounds: Murmur (max RUSE) heard.     Crescendo decrescendo systolic murmur  is present with a grade of 3/6.  Pulmonary:     Effort: Pulmonary effort is normal.  Genitourinary:    Exam position: Lithotomy position.     Vagina: Normal.     Cervix: Normal.  Neurological:     General: No focal deficit present.     Mental Status: She is alert and oriented to person, place, and time. Mental status is at baseline.  Psychiatric:        Mood and Affect: Mood normal.        Behavior: Behavior normal.        Thought Content: Thought content normal.        Judgment: Judgment normal.      Results for orders placed or performed in visit on 11/18/24  POC CREATINE & ALBUMIN,URINE  Result Value Ref Range   Microalbumin Ur, POC 150 mg/L   Creatinine, POC 100 mg/dL   Albumin/Creatinine Ratio, Urine, POC >300   POCT Urinalysis Dipstick (81002)  Result Value Ref Range   Color, UA     Clarity, UA     Glucose, UA Positive (A) Negative   Bilirubin, UA Negative    Ketones, UA Negative    Spec Grav, UA 1.020 1.010 - 1.025   Blood, UA Positive    pH, UA 6.0 5.0 - 8.0   Protein, UA Positive (A) Negative   Urobilinogen, UA 0.2 0.2 or 1.0 E.U./dL   Nitrite, UA Negative    Leukocytes, UA Large (3+) (A) Negative   Appearance     Odor      Recent Results (from the past 2160 hours)  BMP8+Anion Gap     Status: Abnormal   Collection Time: 09/04/24  8:49 AM  Result Value Ref Range   Glucose 117 (H) 70 - 99 mg/dL   BUN 21 6 - 24 mg/dL   Creatinine, Ser 8.80 (H) 0.57 - 1.00 mg/dL   eGFR 53 (L) >40 fO/fpw/8.26   BUN/Creatinine Ratio 18 9 - 23   Sodium 140 134 - 144 mmol/L   Potassium 3.7 3.5 - 5.2 mmol/L   Chloride 100 96 - 106 mmol/L   CO2 27 20 - 29 mmol/L   Anion Gap 13.0 10.0 - 18.0 mmol/L   Calcium  9.7 8.7 - 10.2 mg/dL  CMP (Cancer Center only)     Status: Abnormal   Collection Time: 10/12/24  9:46 AM  Result Value Ref Range   Sodium 139 135 - 145 mmol/L   Potassium 3.5 3.5 - 5.1 mmol/L   Chloride 101 98 - 111 mmol/L   CO2 27 22 - 32 mmol/L   Glucose, Bld 102  (H) 70 - 99 mg/dL    Comment: Glucose reference range applies only to samples taken after fasting for at least 8 hours.   BUN 13 6 - 20 mg/dL   Creatinine 8.88 (H) 9.55 - 1.00 mg/dL   Calcium  9.7 8.9 - 10.3 mg/dL   Total Protein 7.3 6.5 - 8.1 g/dL   Albumin 4.3 3.5 - 5.0 g/dL   AST 21 15 - 41 U/L   ALT 9 0 - 44 U/L   Alkaline Phosphatase 40 38 - 126 U/L   Total Bilirubin 0.2 0.0 - 1.2 mg/dL   GFR, Estimated 57 (L) >60 mL/min    Comment: (NOTE) Calculated using the CKD-EPI Creatinine Equation (2021)    Anion gap 12 5 - 15    Comment: Performed at [facility], [address] [place], [place] [address]  CBC with Differential (Cancer Center Only)     Status: None   Collection Time: 10/12/24  9:46 AM  Result Value Ref Range   WBC Count 6.9 4.0 - 10.5 K/uL   RBC 4.42 3.87 - 5.11 MIL/uL   Hemoglobin 12.8 12.0 - 15.0 g/dL   HCT 61.3 63.9 - 53.9 %   MCV 87.3 80.0 - 100.0 fL   MCH 29.0 26.0 - 34.0 pg   MCHC 33.2 30.0 - 36.0 g/dL   RDW 86.8 88.4 - 84.4 %   Platelet Count 256 150 - 400 K/uL   nRBC 0.0 0.0 - 0.2 %   Neutrophils Relative % 46 %   Neutro Abs 3.1 1.7 - 7.7 K/uL   Lymphocytes Relative 44 %   Lymphs Abs 3.0 0.7 - 4.0 K/uL   Monocytes Relative 7 %   Monocytes Absolute 0.5 0.1 - 1.0 K/uL   Eosinophils Relative 2 %   Eosinophils Absolute 0.2 0.0 - 0.5 K/uL   Basophils Relative 1 %   Basophils Absolute 0.0 0.0 - 0.1 K/uL   Immature Granulocytes 0 %   Abs Immature Granulocytes 0.02 0.00 - 0.07 K/uL    Comment: Performed at [facility], [address] [place], [place] [address]  Comprehensive metabolic panel with GFR     Status: Abnormal   Collection Time: 11/06/24 10:01 AM  Result Value Ref Range   Glucose 125 (H) 70 - 99 mg/dL   BUN 15 8 - 27 mg/dL   Creatinine, Ser 8.78 (H) 0.57 - 1.00 mg/dL   eGFR 51 (L) >40 fO/fpw/8.26   BUN/Creatinine Ratio 12 12 - 28   Sodium 141 134 - 144 mmol/L   Potassium 3.5 3.5 - 5.2 mmol/L   Chloride 102 96 - 106 mmol/L    CO2 24 20 - 29 mmol/L   Calcium  9.3 8.7 - 10.3 mg/dL   Total Protein 6.9 6.0 - 8.5 g/dL   Albumin 4.1 3.8 - 4.9 g/dL   Globulin, Total 2.8 1.5 - 4.5 g/dL   Bilirubin Total 0.3 0.0 - 1.2 mg/dL   Alkaline Phosphatase 40 (L) 49 - 135 IU/L   AST 18 0 - 40 IU/L   ALT 9 0 - 32 IU/L  Lipid panel     Status: Abnormal   Collection Time: 11/06/24 10:01 AM  Result Value Ref Range   Cholesterol, Total 91 (L) 100 -  199 mg/dL   Triglycerides 77 0 - 149 mg/dL   HDL 34 (L) >60 mg/dL   VLDL Cholesterol Cal 16 5 - 40 mg/dL   LDL Chol Calc (NIH) 41 0 - 99 mg/dL   Chol/HDL Ratio 2.7 0.0 - 4.4 ratio    Comment:                                   T. Chol/HDL Ratio                                             Men  Women                               1/2 Avg.Risk  3.4    3.3                                   Avg.Risk  5.0    4.4                                2X Avg.Risk  9.6    7.1                                3X Avg.Risk 23.4   11.0   CK     Status: None   Collection Time: 11/06/24 10:02 AM  Result Value Ref Range   Total CK 111 32 - 182 U/L  POCT Urinalysis Dipstick (18997)     Status: Abnormal   Collection Time: 11/18/24  9:22 AM  Result Value Ref Range   Color, UA     Clarity, UA     Glucose, UA Positive (A) Negative   Bilirubin, UA Negative    Ketones, UA Negative    Spec Grav, UA 1.020 1.010 - 1.025   Blood, UA Positive    pH, UA 6.0 5.0 - 8.0   Protein, UA Positive (A) Negative   Urobilinogen, UA 0.2 0.2 or 1.0 E.U./dL   Nitrite, UA Negative    Leukocytes, UA Large (3+) (A) Negative   Appearance     Odor    POC CREATINE & ALBUMIN,URINE     Status: Abnormal   Collection Time: 11/18/24  9:24 AM  Result Value Ref Range   Microalbumin Ur, POC 150 mg/L   Creatinine, POC 100 mg/dL   Albumin/Creatinine Ratio, Urine, POC >300       Assessment & Plan:  Lindsay Guzman was seen today for follow-up.  Essential (primary) hypertension  History of stroke  CKD stage 3a, GFR 45-59 ml/min  (HCC)  History of breast cancer  Last mammogram 9/23 and continue follow up. Continue secondary stroke prevention.  Problem List Items Addressed This Visit       Cardiovascular and Mediastinum   Essential (primary) hypertension - Primary     Genitourinary   CKD stage 3a, GFR 45-59 ml/min (HCC)     Other   History of stroke   History of breast cancer    Return in about 3 months (around 02/15/2025) for fu with labs prior.  Total time spent: 20 minutes. This time includes review of previous notes and results and patient face to face interaction during today'Lindsay Guzman visit.    Lindsay Cinderella Perry, MD  11/18/2024   This document may have been prepared by [facility] Voice Recognition software and as such may include unintentional dictation errors.     [1]  Allergies Allergen Reactions   Other Anaphylaxis and Swelling    Nuts    Penicillins Hives  [2]  Outpatient Medications Prior to Visit  Medication Sig   aspirin  EC 81 MG tablet Take 81 mg by mouth daily. Swallow whole.    calcium  carbonate (OSCAL) 1500 (600 Ca) MG TABS tablet Take by mouth 2 (two) times daily with a meal.   cholecalciferol (VITAMIN D3) 25 MCG (1000 UNIT) tablet Take 1,000 Units by mouth daily.   ezetimibe (ZETIA) 10 MG tablet Take 10 mg by mouth daily.   famotidine  (PEPCID ) 20 MG tablet Take 1 tablet (20 mg total) by mouth 2 (two) times daily.   hydrALAZINE  (APRESOLINE ) 100 MG tablet Take 100 mg by mouth 2 (two) times daily as needed. (Patient taking differently: Take 100 mg by mouth 2 (two) times daily.)   Multiple Vitamins-Minerals (CENTRUM SILVER ADULT 50+ PO) Take 1 tablet by mouth daily.   Olmesartan -amLODIPine -HCTZ 40-10-25 MG TABS Take 1 tablet by mouth every morning.   Probiotic Product (PROBIOTIC & ACIDOPHILUS EX ST PO) Take by mouth.   rosuvastatin  (CRESTOR ) 40 MG tablet TAKE 1 TABLET BY MOUTH AT BEDTIME   tamoxifen  (NOLVADEX ) 20 MG tablet Take 1 tablet by mouth once daily   Biotin 1 MG CAPS Take by  mouth. (Patient not taking: Reported on 11/18/2024)   Facility-Administered Medications Prior to Visit  Medication Dose Route Frequency Provider   potassium chloride  SA (KLOR-CON  M) CR tablet 20 mEq  20 mEq Oral Once Tejan-Sie, Lutie Pickler Ahmed, MD   "

## 2024-12-14 ENCOUNTER — Ambulatory Visit: Admitting: Internal Medicine

## 2025-02-24 ENCOUNTER — Ambulatory Visit: Admitting: Internal Medicine

## 2025-04-12 ENCOUNTER — Inpatient Hospital Stay: Admitting: Oncology

## 2025-04-12 ENCOUNTER — Inpatient Hospital Stay
